# Patient Record
Sex: Male | Born: 1945 | Race: Black or African American | Hispanic: No | Marital: Single | Smoking: Never smoker
Health system: Southern US, Community
[De-identification: ages and names within clinical notes are randomized; demographics above are authoritative.]

## PROBLEM LIST (undated history)

## (undated) DIAGNOSIS — M1990 Unspecified osteoarthritis, unspecified site: Secondary | ICD-10-CM

## (undated) DIAGNOSIS — I639 Cerebral infarction, unspecified: Secondary | ICD-10-CM

## (undated) DIAGNOSIS — I10 Essential (primary) hypertension: Secondary | ICD-10-CM

---

## 2006-02-11 DIAGNOSIS — I639 Cerebral infarction, unspecified: Secondary | ICD-10-CM

## 2006-02-11 HISTORY — DX: Cerebral infarction, unspecified: I63.9

## 2016-09-28 ENCOUNTER — Encounter (HOSPITAL_COMMUNITY): Payer: Self-pay

## 2016-09-28 ENCOUNTER — Inpatient Hospital Stay (HOSPITAL_COMMUNITY)
Admission: EM | Admit: 2016-09-28 | Discharge: 2016-10-01 | DRG: 065 | Disposition: A | Discharge status: Rehabilitation Facility | Payer: Medicare Other | Attending: Internal Medicine | Admitting: Internal Medicine

## 2016-09-28 ENCOUNTER — Emergency Department (HOSPITAL_COMMUNITY): Payer: Medicare Other

## 2016-09-28 DIAGNOSIS — R531 Weakness: Secondary | ICD-10-CM | POA: Diagnosis present

## 2016-09-28 DIAGNOSIS — Z7902 Long term (current) use of antithrombotics/antiplatelets: Secondary | ICD-10-CM | POA: Diagnosis not present

## 2016-09-28 DIAGNOSIS — R29706 NIHSS score 6: Secondary | ICD-10-CM | POA: Diagnosis present

## 2016-09-28 DIAGNOSIS — N3000 Acute cystitis without hematuria: Secondary | ICD-10-CM | POA: Diagnosis not present

## 2016-09-28 DIAGNOSIS — E876 Hypokalemia: Secondary | ICD-10-CM | POA: Diagnosis present

## 2016-09-28 DIAGNOSIS — M1990 Unspecified osteoarthritis, unspecified site: Secondary | ICD-10-CM | POA: Diagnosis present

## 2016-09-28 DIAGNOSIS — I10 Essential (primary) hypertension: Secondary | ICD-10-CM | POA: Diagnosis present

## 2016-09-28 DIAGNOSIS — R270 Ataxia, unspecified: Secondary | ICD-10-CM

## 2016-09-28 DIAGNOSIS — I69351 Hemiplegia and hemiparesis following cerebral infarction affecting right dominant side: Secondary | ICD-10-CM

## 2016-09-28 DIAGNOSIS — N189 Chronic kidney disease, unspecified: Secondary | ICD-10-CM | POA: Diagnosis present

## 2016-09-28 DIAGNOSIS — N400 Enlarged prostate without lower urinary tract symptoms: Secondary | ICD-10-CM | POA: Diagnosis present

## 2016-09-28 DIAGNOSIS — I639 Cerebral infarction, unspecified: Principal | ICD-10-CM | POA: Diagnosis present

## 2016-09-28 DIAGNOSIS — E785 Hyperlipidemia, unspecified: Secondary | ICD-10-CM | POA: Diagnosis present

## 2016-09-28 DIAGNOSIS — Y92009 Unspecified place in unspecified non-institutional (private) residence as the place of occurrence of the external cause: Secondary | ICD-10-CM

## 2016-09-28 DIAGNOSIS — N390 Urinary tract infection, site not specified: Secondary | ICD-10-CM | POA: Diagnosis present

## 2016-09-28 DIAGNOSIS — R350 Frequency of micturition: Secondary | ICD-10-CM | POA: Diagnosis not present

## 2016-09-28 DIAGNOSIS — R278 Other lack of coordination: Secondary | ICD-10-CM | POA: Diagnosis present

## 2016-09-28 DIAGNOSIS — W19XXXA Unspecified fall, initial encounter: Secondary | ICD-10-CM | POA: Diagnosis present

## 2016-09-28 DIAGNOSIS — Z79899 Other long term (current) drug therapy: Secondary | ICD-10-CM | POA: Diagnosis not present

## 2016-09-28 DIAGNOSIS — N179 Acute kidney failure, unspecified: Secondary | ICD-10-CM | POA: Diagnosis present

## 2016-09-28 DIAGNOSIS — G8191 Hemiplegia, unspecified affecting right dominant side: Secondary | ICD-10-CM | POA: Diagnosis not present

## 2016-09-28 DIAGNOSIS — B9620 Unspecified Escherichia coli [E. coli] as the cause of diseases classified elsewhere: Secondary | ICD-10-CM | POA: Diagnosis present

## 2016-09-28 DIAGNOSIS — Z882 Allergy status to sulfonamides status: Secondary | ICD-10-CM | POA: Diagnosis not present

## 2016-09-28 DIAGNOSIS — I739 Peripheral vascular disease, unspecified: Secondary | ICD-10-CM | POA: Diagnosis present

## 2016-09-28 DIAGNOSIS — G9389 Other specified disorders of brain: Secondary | ICD-10-CM | POA: Diagnosis present

## 2016-09-28 DIAGNOSIS — N183 Chronic kidney disease, stage 3 (moderate): Secondary | ICD-10-CM | POA: Diagnosis not present

## 2016-09-28 DIAGNOSIS — R32 Unspecified urinary incontinence: Secondary | ICD-10-CM | POA: Diagnosis not present

## 2016-09-28 DIAGNOSIS — G8111 Spastic hemiplegia affecting right dominant side: Secondary | ICD-10-CM | POA: Diagnosis not present

## 2016-09-28 DIAGNOSIS — I5030 Unspecified diastolic (congestive) heart failure: Secondary | ICD-10-CM | POA: Diagnosis not present

## 2016-09-28 HISTORY — DX: Unspecified osteoarthritis, unspecified site: M19.90

## 2016-09-28 HISTORY — DX: Essential (primary) hypertension: I10

## 2016-09-28 HISTORY — DX: Cerebral infarction, unspecified: I63.9

## 2016-09-28 LAB — COMPREHENSIVE METABOLIC PANEL
ALBUMIN: 3.6 g/dL (ref 3.5–5.0)
ALK PHOS: 65 U/L (ref 38–126)
ALT: 16 U/L — AB (ref 17–63)
AST: 21 U/L (ref 15–41)
Anion gap: 8 (ref 5–15)
BUN: 13 mg/dL (ref 6–20)
CHLORIDE: 113 mmol/L — AB (ref 101–111)
CO2: 24 mmol/L (ref 22–32)
CREATININE: 1.66 mg/dL — AB (ref 0.61–1.24)
Calcium: 8.6 mg/dL — ABNORMAL LOW (ref 8.9–10.3)
GFR calc Af Amer: 47 mL/min — ABNORMAL LOW (ref >60)
GFR calc non Af Amer: 40 mL/min — ABNORMAL LOW (ref >60)
GLUCOSE: 109 mg/dL — AB (ref 65–99)
Potassium: 3.4 mmol/L — ABNORMAL LOW (ref 3.5–5.1)
SODIUM: 145 mmol/L (ref 135–145)
Total Bilirubin: 1.3 mg/dL — ABNORMAL HIGH (ref 0.3–1.2)
Total Protein: 6.7 g/dL (ref 6.5–8.1)

## 2016-09-28 LAB — URINALYSIS, ROUTINE W REFLEX MICROSCOPIC
Bilirubin Urine: NEGATIVE
GLUCOSE, UA: NEGATIVE mg/dL
KETONES UR: NEGATIVE mg/dL
NITRITE: POSITIVE — AB
PROTEIN: 100 mg/dL — AB
Specific Gravity, Urine: 1.014 (ref 1.005–1.030)
pH: 5 (ref 5.0–8.0)

## 2016-09-28 LAB — CBC WITH DIFFERENTIAL/PLATELET
BASOS ABS: 0 10*3/uL (ref 0.0–0.1)
Basophils Relative: 0 %
EOS ABS: 0.1 10*3/uL (ref 0.0–0.7)
EOS PCT: 2 %
HCT: 41.7 % (ref 39.0–52.0)
HEMOGLOBIN: 15.1 g/dL (ref 13.0–17.0)
Lymphocytes Relative: 25 %
Lymphs Abs: 1.5 10*3/uL (ref 0.7–4.0)
MCH: 32.1 pg (ref 26.0–34.0)
MCHC: 36.2 g/dL — ABNORMAL HIGH (ref 30.0–36.0)
MCV: 88.5 fL (ref 78.0–100.0)
Monocytes Absolute: 0.6 10*3/uL (ref 0.1–1.0)
Monocytes Relative: 9 %
NEUTROS PCT: 64 %
Neutro Abs: 3.9 10*3/uL (ref 1.7–7.7)
PLATELETS: 161 10*3/uL (ref 150–400)
RBC: 4.71 MIL/uL (ref 4.22–5.81)
RDW: 13.4 % (ref 11.5–15.5)
WBC: 6.1 10*3/uL (ref 4.0–10.5)

## 2016-09-28 LAB — I-STAT TROPONIN, ED: TROPONIN I, POC: 0.01 ng/mL (ref 0.00–0.08)

## 2016-09-28 MED ORDER — AMLODIPINE BESYLATE 5 MG PO TABS
5.0000 mg | ORAL_TABLET | Freq: Every day | ORAL | Status: DC
  Administered 2016-09-28 – 2016-09-29 (×2): 5 mg via ORAL
  Filled 2016-09-28 (×2): qty 1

## 2016-09-28 MED ORDER — ENOXAPARIN SODIUM 40 MG/0.4ML ~~LOC~~ SOLN
40.0000 mg | SUBCUTANEOUS | Status: DC
  Administered 2016-09-28 – 2016-09-30 (×3): 40 mg via SUBCUTANEOUS
  Filled 2016-09-28 (×3): qty 0.4

## 2016-09-28 MED ORDER — DEXTROSE 5 % IV SOLN
1.0000 g | INTRAVENOUS | Status: DC
  Administered 2016-09-29 – 2016-09-30 (×2): 1 g via INTRAVENOUS
  Filled 2016-09-28 (×3): qty 10

## 2016-09-28 MED ORDER — CLOPIDOGREL BISULFATE 75 MG PO TABS
75.0000 mg | ORAL_TABLET | Freq: Every day | ORAL | Status: DC
  Administered 2016-09-28 – 2016-10-01 (×4): 75 mg via ORAL
  Filled 2016-09-28 (×4): qty 1

## 2016-09-28 MED ORDER — SODIUM CHLORIDE 0.45 % IV SOLN
INTRAVENOUS | Status: DC
  Administered 2016-09-28 – 2016-09-29 (×3): via INTRAVENOUS

## 2016-09-28 MED ORDER — METOPROLOL SUCCINATE ER 50 MG PO TB24
50.0000 mg | ORAL_TABLET | Freq: Every day | ORAL | Status: DC
  Administered 2016-09-28 – 2016-09-29 (×2): 50 mg via ORAL
  Filled 2016-09-28 (×2): qty 1

## 2016-09-28 MED ORDER — FINASTERIDE 5 MG PO TABS
5.0000 mg | ORAL_TABLET | Freq: Every day | ORAL | Status: DC
  Administered 2016-09-28 – 2016-10-01 (×4): 5 mg via ORAL
  Filled 2016-09-28 (×4): qty 1

## 2016-09-28 MED ORDER — SIMVASTATIN 20 MG PO TABS
20.0000 mg | ORAL_TABLET | Freq: Every day | ORAL | Status: DC
  Administered 2016-09-28 – 2016-09-29 (×2): 20 mg via ORAL
  Filled 2016-09-28 (×2): qty 1

## 2016-09-28 MED ORDER — CEFTRIAXONE SODIUM 2 G IJ SOLR
2.0000 g | Freq: Once | INTRAMUSCULAR | Status: AC
  Administered 2016-09-28: 2 g via INTRAVENOUS
  Filled 2016-09-28: qty 2

## 2016-09-28 NOTE — ED Notes (Signed)
ED TO INPATIENT HANDOFF REPORT  Name/Age/Gender Robert Buckley 71 y.o. male  Code Status    Code Status Orders        Start     Ordered   09/28/16 1458  Full code  Continuous     09/28/16 1459    Code Status History    Date Active Date Inactive Code Status Order ID Comments User Context   This patient has a current code status but no historical code status.      Home/SNF/Other Home  Chief Complaint Fall  Level of Care/Admitting Diagnosis ED Disposition    ED Disposition Condition Comment   Admit  Hospital Area: [facility] [[id number]]  Level of Care: Med-Surg [16]  Diagnosis: UTI (urinary tract infection) [240973]  Admitting Physician: Georgette Shell [[id number]]  Attending Physician: Georgette Shell [[id number]]  Estimated length of stay: 3 - 4 days  Certification:: I certify this patient will need inpatient services for at least 2 midnights  PT Class (Do Not Modify): Inpatient [101]  PT Acc Code (Do Not Modify): Private [1]       Medical History Past Medical History:  Diagnosis Date  . Arthritis   . Hypertension   . Stroke [facility]) 2008   residual R sided weakness    Allergies Allergies  Allergen Reactions  . Sulfa Antibiotics Rash    IV Location/Drains/Wounds Patient Lines/Drains/Airways Status   Active Line/Drains/Airways    None          Labs/Imaging Results for orders placed or performed during the hospital encounter of 09/28/16 (from the past 48 hour(s))  Urinalysis, Routine w reflex microscopic     Status: Abnormal   Collection Time: 09/28/16 11:50 AM  Result Value Ref Range   Color, Urine YELLOW YELLOW   APPearance HAZY (A) CLEAR   Specific Gravity, Urine 1.014 1.005 - 1.030   pH 5.0 5.0 - 8.0   Glucose, UA NEGATIVE NEGATIVE mg/dL   Hgb urine dipstick SMALL (A) NEGATIVE   Bilirubin Urine NEGATIVE NEGATIVE   Ketones, ur NEGATIVE NEGATIVE mg/dL   Protein, ur 100 (A) NEGATIVE mg/dL   Nitrite POSITIVE (A)  NEGATIVE   Leukocytes, UA LARGE (A) NEGATIVE   RBC / HPF 0-5 0 - 5 RBC/hpf   WBC, UA TOO NUMEROUS TO COUNT 0 - 5 WBC/hpf   Bacteria, UA MANY (A) NONE SEEN   Squamous Epithelial / LPF 0-5 (A) NONE SEEN   WBC Clumps PRESENT    Mucous PRESENT   CBC with Differential     Status: Abnormal   Collection Time: 09/28/16 12:30 PM  Result Value Ref Range   WBC 6.1 4.0 - 10.5 K/uL   RBC 4.71 4.22 - 5.81 MIL/uL   Hemoglobin 15.1 13.0 - 17.0 g/dL   HCT 41.7 39.0 - 52.0 %   MCV 88.5 78.0 - 100.0 fL   MCH 32.1 26.0 - 34.0 pg   MCHC 36.2 (H) 30.0 - 36.0 g/dL   RDW 13.4 11.5 - 15.5 %   Platelets 161 150 - 400 K/uL   Neutrophils Relative % 64 %   Neutro Abs 3.9 1.7 - 7.7 K/uL   Lymphocytes Relative 25 %   Lymphs Abs 1.5 0.7 - 4.0 K/uL   Monocytes Relative 9 %   Monocytes Absolute 0.6 0.1 - 1.0 K/uL   Eosinophils Relative 2 %   Eosinophils Absolute 0.1 0.0 - 0.7 K/uL   Basophils Relative 0 %   Basophils Absolute 0.0 0.0 - 0.1 K/uL  Comprehensive metabolic panel     Status: Abnormal   Collection Time: 09/28/16 12:30 PM  Result Value Ref Range   Sodium 145 135 - 145 mmol/L   Potassium 3.4 (L) 3.5 - 5.1 mmol/L   Chloride 113 (H) 101 - 111 mmol/L   CO2 24 22 - 32 mmol/L   Glucose, Bld 109 (H) 65 - 99 mg/dL   BUN 13 6 - 20 mg/dL   Creatinine, Ser 1.66 (H) 0.61 - 1.24 mg/dL   Calcium 8.6 (L) 8.9 - 10.3 mg/dL   Total Protein 6.7 6.5 - 8.1 g/dL   Albumin 3.6 3.5 - 5.0 g/dL   AST 21 15 - 41 U/L   ALT 16 (L) 17 - 63 U/L   Alkaline Phosphatase 65 38 - 126 U/L   Total Bilirubin 1.3 (H) 0.3 - 1.2 mg/dL   GFR calc non Af Amer 40 (L) >60 mL/min   GFR calc Af Amer 47 (L) >60 mL/min    Comment: (NOTE) The eGFR has been calculated using the CKD EPI equation. This calculation has not been validated in all clinical situations. eGFR's persistently <60 mL/min signify possible Chronic Kidney Disease.    Anion gap 8 5 - 15  I-Stat Troponin, ED (not at [facility])     Status: None   Collection Time: 09/28/16  12:41 PM  Result Value Ref Range   Troponin i, poc 0.01 0.00 - 0.08 ng/mL   Comment 3            Comment: Due to the release kinetics of cTnI, a negative result within the first hours of the onset of symptoms does not rule out myocardial infarction with certainty. If myocardial infarction is still suspected, repeat the test at appropriate intervals.    Dg Lumbar Spine Complete  Result Date: 09/28/2016 CLINICAL DATA:  Pt had a fall 2 days ago at home after trying to get up from couch to go to the bathroom. Hx of stroke in 2008, with residual right sided weakness. States since his fall, he has not been able to feel his right leg and has no muscle control of it. EXAM: LUMBAR SPINE - COMPLETE 4+ VIEW COMPARISON:  None. FINDINGS: No fracture. Slight anterolisthesis of L4 on L5, degenerative in origin. No other spondylolisthesis. Moderate loss of disc height at L4-L5 and L5-S1. Endplate osteophytes noted most prominently at these levels. There is facet degenerative change bilaterally at L4-L5-L5-S1. Bones are demineralized. Soft tissues are unremarkable. IMPRESSION: 1. No fracture or acute finding. 2. Degenerative changes as detailed. Electronically Signed   By: Lajean Manes M.D.   On: 09/28/2016 12:27   Ct Head Wo Contrast  Result Date: 09/28/2016 CLINICAL DATA:  Fall 2 days ago. Fall yesterday as well. Dizziness. Stroke in 2008 right-sided weakness. EXAM: CT HEAD WITHOUT CONTRAST TECHNIQUE: Contiguous axial images were obtained from the base of the skull through the vertex without intravenous contrast. COMPARISON:  None. FINDINGS: Brain: No subdural, epidural, or subarachnoid hemorrhage. Cerebellum, brainstem, and basal cisterns are normal. Ventricles and sulci are prominent. White matter changes are identified. No acute cortical ischemia or infarct. No mass, mass effect, or midline shift. Vascular: Calcified atherosclerosis is seen in the intracranial carotid arteries. Skull: Normal. Negative for  fracture or focal lesion. Sinuses/Orbits: No acute finding. Other: None. IMPRESSION: 1. No acute abnormalities identified. Chronic white matter changes. Prominence of ventricles and sulci may represent volume loss. Given the history of falls, recommend correlation for symptoms of normal pressure hydrocephalus as well. Electronically  Signed   By: Dorise Bullion III M.D   On: 09/28/2016 12:15    Pending Labs Unresulted Labs    Start     Ordered   09/29/16 7564  Basic metabolic panel  Tomorrow morning,   R     09/28/16 1459   09/28/16 1402  Urine culture  STAT,   STAT     09/28/16 1401   09/28/16 1402  Blood culture (routine x 2)  BLOOD CULTURE X 2,   STAT     09/28/16 1401      Vitals/Pain Today's Vitals   09/28/16 1118 09/28/16 1119 09/28/16 1352  BP: (!) 161/93  (!) 146/118  Pulse: 72  67  Resp: 16  14  Temp: 98 F (36.7 C)    TempSrc: Oral    SpO2: 93%  93%  Weight:  203 lb (92.1 kg)   Height:  '5\' 7"'  (1.702 m)     Isolation Precautions No active isolations  Medications Medications  enoxaparin (LOVENOX) injection 30 mg (not administered)  amLODipine (NORVASC) tablet 5 mg (not administered)  clopidogrel (PLAVIX) tablet 75 mg (not administered)  finasteride (PROSCAR) tablet 5 mg (not administered)  metoprolol succinate (TOPROL-XL) 24 hr tablet 50 mg (not administered)  simvastatin (ZOCOR) tablet 20 mg (not administered)  cefTRIAXone (ROCEPHIN) 2 g in dextrose 5 % 50 mL IVPB (0 g Intravenous Stopped 09/28/16 1513)    Mobility walks with device

## 2016-09-28 NOTE — ED Notes (Signed)
x2 unsuccessful IV

## 2016-09-28 NOTE — H&P (Signed)
History and Physical    Robert Buckley WGN:[id number] DOB: 16-Mar-1945 DOA: 09/28/2016  PCP: Patient, No Pcp Per Patient coming from: home  I have personally briefly reviewed patient's old medical records in [facility]  Chief Complaint: cantwalk  HPI: Robert Buckley is a 71 y.o. male with medical history significant for stroke with residual right-sided weakness admitted with worsening of the right-sided weakness. Patient had a fall 2 days ago at home while he was trying to get up from chair. Ever since he has not been able to walk. He denies any pain. He denies hitting his head. He denies loss of consciousness. He denies hitting his head. At baseline he was able to walk with a walker inside the house. He reports that he cannot do that anymore. He denies any fever chills nausea vomiting or diarrhea. He denies any headache or changes with his vision. He denied any dysuria.  ED Course: He had a CT scan of the head which she did not reveal anything acute. UA showed positive nitrites and leukocytes. His BUN is 13 creatinine is 1.66 I do not have a baseline creatinine. The ER doctor spoke with the neurologist on call to do MRI on Monday if his symptoms continuous or his symptoms gets worse and to consult them officialy. 000  Review of Systems: As per HPI otherwise 10 point review of systems negative.    Past Medical History:  Diagnosis Date  . Arthritis   . Hypertension     History reviewed. No pertinent surgical history.   has no tobacco, alcohol, and drug history on file.  Allergies  Allergen Reactions  . Sulfa Antibiotics Rash    No family history on file.   Prior to Admission medications   Medication Sig Start Date End Date Taking? Authorizing Provider  amLODipine (NORVASC) 5 MG tablet Take 5 mg by mouth daily.   Yes [provider]  clopidogrel (PLAVIX) 75 MG tablet Take 75 mg by mouth daily.   Yes [provider]  finasteride (PROSCAR) 5 MG tablet Take  5 mg by mouth daily.   Yes [provider]  metoprolol succinate (TOPROL-XL) 50 MG 24 hr tablet Take 50 mg by mouth daily. Take with or immediately following a meal.   Yes [provider]  ramipril (ALTACE) 10 MG capsule Take 10 mg by mouth daily.   Yes [provider]  simvastatin (ZOCOR) 20 MG tablet Take 20 mg by mouth daily.   Yes [provider]    Physical Exam: Vitals:   09/28/16 1118 09/28/16 1119 09/28/16 1352  BP: (!) 161/93  (!) 146/118  Pulse: 72  67  Resp: 16  14  Temp: 98 F (36.7 C)    TempSrc: Oral    SpO2: 93%  93%  Weight:  92.1 kg (203 lb)   Height:  5\' 7"  (1.702 m)     Constitutional: NAD, calm, comfortable Vitals:   09/28/16 1118 09/28/16 1119 09/28/16 1352  BP: (!) 161/93  (!) 146/118  Pulse: 72  67  Resp: 16  14  Temp: 98 F (36.7 C)    TempSrc: Oral    SpO2: 93%  93%  Weight:  92.1 kg (203 lb)   Height:  5\' 7"  (1.702 m)    Eyes: PERRL, lids and conjunctivae normal ENMT: Mucous membranes are moist. Posterior pharynx clear of any exudate or lesions.Normal dentition.  Neck: normal, supple, no masses, no thyromegaly Respiratory: clear to auscultation bilaterally, no wheezing, no crackles. Normal  respiratory effort. No accessory muscle use.  Cardiovascular: Regular rate and rhythm, no murmurs / rubs / gallops. No extremity edema. 2+ pedal pulses. No carotid bruits.  Abdomen: no tenderness, no masses palpated. No hepatosplenomegaly. Bowel sounds positive.  Musculoskeletal: no clubbing / cyanosis. No joint deformity upper and lower extremities. Good ROM, no contractures. Normal muscle tone.  Skin: no rashes, lesions, ulcers. No induration Neurologic: CN 2-12 grossly intact. Sensation intact, DTR normal. Strength 5/5 in all 4.  Psychiatric: Normal judgment and insight. Alert and oriented x 3. Normal mood.   (Anything < 9 systems with 2 bullets each down codes to level 1) (If patient refuses exam can't bill higher  level) (Make sure to document decubitus ulcers present on admission -- if possible -- and whether patient has chronic indwelling catheter at time of admission)  Labs on Admission: I have personally reviewed following labs and imaging studies  CBC:  Recent Labs Lab 09/28/16 1230  WBC 6.1  NEUTROABS 3.9  HGB 15.1  HCT 41.7  MCV 88.5  PLT 161   Basic Metabolic Panel:  Recent Labs Lab 09/28/16 1230  NA 145  K 3.4*  CL 113*  CO2 24  GLUCOSE 109*  BUN 13  CREATININE 1.66*  CALCIUM 8.6*   GFR: Estimated Creatinine Clearance: 44.8 mL/min (A) (by C-G formula based on SCr of 1.66 mg/dL (H)). Liver Function Tests:  Recent Labs Lab 09/28/16 1230  AST 21  ALT 16*  ALKPHOS 65  BILITOT 1.3*  PROT 6.7  ALBUMIN 3.6   No results for input(s): LIPASE, AMYLASE in the last 168 hours. No results for input(s): AMMONIA in the last 168 hours. Coagulation Profile: No results for input(s): INR, PROTIME in the last 168 hours. Cardiac Enzymes: No results for input(s): CKTOTAL, CKMB, CKMBINDEX, TROPONINI in the last 168 hours. BNP (last 3 results) No results for input(s): PROBNP in the last 8760 hours. HbA1C: No results for input(s): HGBA1C in the last 72 hours. CBG: No results for input(s): GLUCAP in the last 168 hours. Lipid Profile: No results for input(s): CHOL, HDL, LDLCALC, TRIG, CHOLHDL, LDLDIRECT in the last 72 hours. Thyroid Function Tests: No results for input(s): TSH, T4TOTAL, FREET4, T3FREE, THYROIDAB in the last 72 hours. Anemia Panel: No results for input(s): VITAMINB12, FOLATE, FERRITIN, TIBC, IRON, RETICCTPCT in the last 72 hours. Urine analysis:    Component Value Date/Time   COLORURINE YELLOW 09/28/2016 1150   APPEARANCEUR HAZY (A) 09/28/2016 1150   LABSPEC 1.014 09/28/2016 1150   PHURINE 5.0 09/28/2016 1150   GLUCOSEU NEGATIVE 09/28/2016 1150   HGBUR SMALL (A) 09/28/2016 1150   BILIRUBINUR NEGATIVE 09/28/2016 1150   KETONESUR NEGATIVE 09/28/2016 1150    PROTEINUR 100 (A) 09/28/2016 1150   NITRITE POSITIVE (A) 09/28/2016 1150   LEUKOCYTESUR LARGE (A) 09/28/2016 1150    Radiological Exams on Admission: Dg Lumbar Spine Complete  Result Date: 09/28/2016 CLINICAL DATA:  Pt had a fall 2 days ago at home after trying to get up from couch to go to the bathroom. Hx of stroke in 2008, with residual right sided weakness. States since his fall, he has not been able to feel his right leg and has no muscle control of it. EXAM: LUMBAR SPINE - COMPLETE 4+ VIEW COMPARISON:  None. FINDINGS: No fracture. Slight anterolisthesis of L4 on L5, degenerative in origin. No other spondylolisthesis. Moderate loss of disc height at L4-L5 and L5-S1. Endplate osteophytes noted most prominently at these levels. There is facet degenerative change bilaterally at L4-L5-L5-S1.  Bones are demineralized. Soft tissues are unremarkable. IMPRESSION: 1. No fracture or acute finding. 2. Degenerative changes as detailed. Electronically Signed   By: Amie Portland M.D.   On: 09/28/2016 12:27   Ct Head Wo Contrast  Result Date: 09/28/2016 CLINICAL DATA:  Fall 2 days ago. Fall yesterday as well. Dizziness. Stroke in 2008 right-sided weakness. EXAM: CT HEAD WITHOUT CONTRAST TECHNIQUE: Contiguous axial images were obtained from the base of the skull through the vertex without intravenous contrast. COMPARISON:  None. FINDINGS: Brain: No subdural, epidural, or subarachnoid hemorrhage. Cerebellum, brainstem, and basal cisterns are normal. Ventricles and sulci are prominent. White matter changes are identified. No acute cortical ischemia or infarct. No mass, mass effect, or midline shift. Vascular: Calcified atherosclerosis is seen in the intracranial carotid arteries. Skull: Normal. Negative for fracture or focal lesion. Sinuses/Orbits: No acute finding. Other: None. IMPRESSION: 1. No acute abnormalities identified. Chronic white matter changes. Prominence of ventricles and sulci may represent volume loss.  Given the history of falls, recommend correlation for symptoms of normal pressure hydrocephalus as well. Electronically Signed   By: Gerome Sam III M.D   On: 09/28/2016 12:15    EKG: Independently reviewed.   Assessment/Plan Active Problems:   UTI (urinary tract infection)   Urinary tract infection patient received a dose of Rocephin in the ER.UA consistent with urinary tract infection. Follow-up urine culture. Continue Proscar for prostate enlargement.  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  Renal insufficiency unknown baseline.ivf and follow up.  HTN continue norvasc andmetoprolol.hold altace for now dueto renal insufficiancy.restart later.        DVT prophylaxis lovenox Code Status: full Family Communication:  Disposition Plan: snf Consults called: none Admission status: inpatient   Alwyn Ren MD [facility]   If 7PM-7AM, please contact night-coverage [email or web address] Password [email or web address]  09/28/2016, 3:06 PM

## 2016-09-28 NOTE — ED Triage Notes (Signed)
Per EMS: Pt had a fall 2 days ago at home after trying to get up from a chair. Hx of stroke in 2008, with residual right sided weakness. Pt reports having another fall yesterday in the bathroom. Pt denies getting dizzy prior to fall, denies LOC or hitting his head. EMS VS: 142/80, HR 78, RR 16, 02 95% room air.

## 2016-09-28 NOTE — Progress Notes (Signed)
Bed request approved for [id number], ED RN can call report to Morrie Sheldon, RN at [phone] at 3:30.  Earnest Conroy. Clelia Croft, RN

## 2016-09-28 NOTE — ED Notes (Signed)
Two sets of blood cultures sent to the lab

## 2016-09-28 NOTE — ED Notes (Signed)
Patient transported to CT 

## 2016-09-28 NOTE — ED Provider Notes (Signed)
[facility]-EMERGENCY DEPT Provider Note   CSN: [id number] Arrival date & time: 09/28/16  1105     History   Chief Complaint Chief Complaint  Patient presents with  . Fall    HPI Robert Buckley is a 71 y.o. male.  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 Patient's son initially described generalized weakness, patient unable to get up and move around after this fall. Reports that he is in normal state of health prior to the fall. Reports that he was trying to get out of his chair to his walker when he slid to the ground. Reports he did not lose consciousness, fell onto his bottom, and then laid back. Denies traumatic injuries or pain, including no headache, neck pain, back pain, leg pain. Reports he's been in her normal state of health without fevers, cough, shortness of breath, chest pain, abdominal pain, diarrhea, vomiting, new numbness, change in speech or change in vision. Reports he's had right-sided weakness since his CVA, but his right lower extremity weakness appears worse. Reports typically he is able to ambulate with his walker, however since this fall, he just been unable to stand up or ambulate. Reports he cannot bend his leg. Reports the flexion of the hip is at baseline however.   Past Medical History:  Diagnosis Date  . Arthritis   . Hypertension     There are no active problems to display for this patient.   History reviewed. No pertinent surgical history.     Home Medications    Prior to Admission medications   Medication Sig Start Date End Date Taking? Authorizing Provider  amLODipine (NORVASC) 5 MG tablet Take 5 mg by mouth daily.   Yes [provider]  clopidogrel (PLAVIX) 75 MG tablet Take 75 mg by mouth daily.   Yes [provider]  finasteride (PROSCAR) 5 MG tablet Take 5 mg by mouth daily.   Yes  [provider]  metoprolol succinate (TOPROL-XL) 50 MG 24 hr tablet Take 50 mg by mouth daily. Take with or immediately following a meal.   Yes [provider]  ramipril (ALTACE) 10 MG capsule Take 10 mg by mouth daily.   Yes [provider]  simvastatin (ZOCOR) 20 MG tablet Take 20 mg by mouth daily.   Yes [provider]    Family History No family history on file.  Social History Social History  Substance Use Topics  . Smoking status: Not on file  . Smokeless tobacco: Not on file  . Alcohol use Not on file     Allergies   Sulfa antibiotics   Review of Systems Review of Systems  Constitutional: Negative for fever.  HENT: Negative for sore throat.   Eyes: Negative for visual disturbance.  Respiratory: Negative for shortness of breath.   Cardiovascular: Negative for chest pain.  Gastrointestinal: Negative for abdominal pain, diarrhea, nausea and vomiting.  Genitourinary: Negative for difficulty urinating and dysuria.  Musculoskeletal: Negative for back pain and neck stiffness.  Skin: Negative for rash.  Neurological: Positive for weakness (right lower extermity worse than baselien). Negative for syncope, facial asymmetry, speech difficulty, numbness and headaches.     Physical Exam Updated Vital Signs BP (!) 146/118   Pulse 67   Temp 98 F (36.7 C) (Oral)   Resp 14   Ht 5\' 7"  (1.702 m)   Wt  92.1 kg (203 lb)   SpO2 93%   BMI 31.79 kg/m   Physical Exam  Constitutional: He is oriented to person, place, and time. He appears well-developed and well-nourished. No distress.  HENT:  Head: Normocephalic and atraumatic.  Eyes: Pupils are equal, round, and reactive to light. Conjunctivae and EOM are normal.  Neck: Normal range of motion.  Cardiovascular: Normal rate, regular rhythm, normal heart sounds and intact distal pulses.  Exam reveals no gallop and no friction rub.   No murmur heard. Pulmonary/Chest: Effort normal and breath  sounds normal. No respiratory distress. He has no wheezes. He has no rales.  Abdominal: Soft. He exhibits no distension. There is no tenderness. There is no guarding.  Musculoskeletal: He exhibits no edema.  Neurological: He is alert and oriented to person, place, and time.  Right arm weakness at baseline per pt and son, weak grip, weak flexion, extension with more strength but weak in comparison to left, right pronator drift  Right LE hip flexion with slight drift (baseline per pt)  Unable to flex right knee at all which is new, able to extend Weakness with right plantar/dori flexion which pt and son state is increased from baseline  Skin: Skin is warm and dry. He is not diaphoretic.  Nursing note and vitals reviewed.    ED Treatments / Results  Labs (all labs ordered are listed, but only abnormal results are displayed) Labs Reviewed  CBC WITH DIFFERENTIAL/PLATELET - Abnormal; Notable for the following:       Result Value   MCHC 36.2 (*)    All other components within normal limits  COMPREHENSIVE METABOLIC PANEL - Abnormal; Notable for the following:    Potassium 3.4 (*)    Chloride 113 (*)    Glucose, Bld 109 (*)    Creatinine, Ser 1.66 (*)    Calcium 8.6 (*)    ALT 16 (*)    Total Bilirubin 1.3 (*)    GFR calc non Af Amer 40 (*)    GFR calc Af Amer 47 (*)    All other components within normal limits  URINALYSIS, ROUTINE W REFLEX MICROSCOPIC - Abnormal; Notable for the following:    APPearance HAZY (*)    Hgb urine dipstick SMALL (*)    Protein, ur 100 (*)    Nitrite POSITIVE (*)    Leukocytes, UA LARGE (*)    Bacteria, UA MANY (*)    Squamous Epithelial / LPF 0-5 (*)    All other components within normal limits  URINE CULTURE  CULTURE, BLOOD (ROUTINE X 2)  CULTURE, BLOOD (ROUTINE X 2)  I-STAT TROPONIN, ED    EKG  EKG Interpretation  Date/Time:  Saturday September 28 2016 11:42:34 EDT Ventricular Rate:  57 PR Interval:    QRS Duration: 104 QT Interval:  423 QTC  Calculation: 412 R Axis:   -33 Text Interpretation:  Sinus rhythm Short PR interval Abnormal R-wave progression, early transition Left ventricular hypertrophy Abnormal T, consider ischemia, diffuse leads Baseline wander in lead(s) V3 No previous ECGs available Confirmed by Alvira Monday ([id number]) on 09/28/2016 11:48:10 AM       Radiology Dg Lumbar Spine Complete  Result Date: 09/28/2016 CLINICAL DATA:  Pt had a fall 2 days ago at home after trying to get up from couch to go to the bathroom. Hx of stroke in 2008, with residual right sided weakness. States since his fall, he has not been able to feel his right leg and has no muscle control  of it. EXAM: LUMBAR SPINE - COMPLETE 4+ VIEW COMPARISON:  None. FINDINGS: No fracture. Slight anterolisthesis of L4 on L5, degenerative in origin. No other spondylolisthesis. Moderate loss of disc height at L4-L5 and L5-S1. Endplate osteophytes noted most prominently at these levels. There is facet degenerative change bilaterally at L4-L5-L5-S1. Bones are demineralized. Soft tissues are unremarkable. IMPRESSION: 1. No fracture or acute finding. 2. Degenerative changes as detailed. Electronically Signed   By: Amie Portland M.D.   On: 09/28/2016 12:27   Ct Head Wo Contrast  Result Date: 09/28/2016 CLINICAL DATA:  Fall 2 days ago. Fall yesterday as well. Dizziness. Stroke in 2008 right-sided weakness. EXAM: CT HEAD WITHOUT CONTRAST TECHNIQUE: Contiguous axial images were obtained from the base of the skull through the vertex without intravenous contrast. COMPARISON:  None. FINDINGS: Brain: No subdural, epidural, or subarachnoid hemorrhage. Cerebellum, brainstem, and basal cisterns are normal. Ventricles and sulci are prominent. White matter changes are identified. No acute cortical ischemia or infarct. No mass, mass effect, or midline shift. Vascular: Calcified atherosclerosis is seen in the intracranial carotid arteries. Skull: Normal. Negative for fracture or focal  lesion. Sinuses/Orbits: No acute finding. Other: None. IMPRESSION: 1. No acute abnormalities identified. Chronic white matter changes. Prominence of ventricles and sulci may represent volume loss. Given the history of falls, recommend correlation for symptoms of normal pressure hydrocephalus as well. Electronically Signed   By: Gerome Sam III M.D   On: 09/28/2016 12:15    Procedures Procedures (including critical care time)  Medications Ordered in ED Medications  cefTRIAXone (ROCEPHIN) 2 g in dextrose 5 % 50 mL IVPB (not administered)     Initial Impression / Assessment and Plan / ED Course  I have reviewed the triage vital signs and the nursing notes.  Pertinent labs & imaging results that were available during my care of the patient were reviewed by me and considered in my medical decision making (see chart for details).     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 Given hx of CVA with fall and new weakness, Head CT done showing no acute abnormalities. Work up for possible contributors to generalized weakness shows no significant anemia or electrolyte abnormalities. Urinalysis shows UTI.  Lumbar XR shows no acute findings.  No significant pain to suggest hamstring muscle tear.   Given increased right sided weakness from baseline, inability to ambulate, and presence of UTI, suspect stroke reactivation as most likely etiology.  Discussed with Neurology. Given duration, no new symptoms from baseline and worsening of chronic deficit, feel admission to [facility], PT/OT, treatment of UTI is appropriate, consider MR brain/lumbar spine. Pt admitted for further care.   Final Clinical Impressions(s) / ED Diagnoses   Final diagnoses:  Right sided weakness  Urinary tract infection without hematuria, site unspecified    New Prescriptions New  Prescriptions   No medications on file     Alvira Monday, MD 09/28/16 1420

## 2016-09-28 NOTE — ED Notes (Addendum)
Called 4W in attempt to inquire about bringing pt up, secretary states she will call the charge nurse to make sure that 1430 is where pt is going to and will call us back

## 2016-09-28 NOTE — ED Notes (Signed)
Bed: QB16 Expected date:  Expected time:  Means of arrival:  Comments: 71 yo fall

## 2016-09-29 ENCOUNTER — Inpatient Hospital Stay (HOSPITAL_COMMUNITY): Payer: Medicare Other

## 2016-09-29 ENCOUNTER — Ambulatory Visit (HOSPITAL_COMMUNITY)
Admit: 2016-09-29 | Discharge: 2016-09-29 | Disposition: A | Discharge status: Home / Self Care | Payer: Medicare Other | Attending: Internal Medicine | Admitting: Internal Medicine

## 2016-09-29 ENCOUNTER — Ambulatory Visit (HOSPITAL_COMMUNITY)
Admission: RE | Admit: 2016-09-29 | Discharge: 2016-09-29 | Disposition: A | Discharge status: Home / Self Care | Payer: Medicare Other | Source: Other Acute Inpatient Hospital | Attending: Internal Medicine | Admitting: Internal Medicine

## 2016-09-29 ENCOUNTER — Encounter (HOSPITAL_COMMUNITY): Payer: Self-pay

## 2016-09-29 DIAGNOSIS — N3000 Acute cystitis without hematuria: Secondary | ICD-10-CM

## 2016-09-29 DIAGNOSIS — R531 Weakness: Secondary | ICD-10-CM

## 2016-09-29 LAB — BLOOD CULTURE ID PANEL (REFLEXED)
ACINETOBACTER BAUMANNII: NOT DETECTED
CANDIDA GLABRATA: NOT DETECTED
CANDIDA KRUSEI: NOT DETECTED
Candida albicans: NOT DETECTED
Candida parapsilosis: NOT DETECTED
Candida tropicalis: NOT DETECTED
Carbapenem resistance: NOT DETECTED
ENTEROBACTER CLOACAE COMPLEX: NOT DETECTED
ENTEROBACTERIACEAE SPECIES: NOT DETECTED
ESCHERICHIA COLI: NOT DETECTED
Enterococcus species: NOT DETECTED
Haemophilus influenzae: NOT DETECTED
KLEBSIELLA OXYTOCA: NOT DETECTED
Klebsiella pneumoniae: NOT DETECTED
Listeria monocytogenes: NOT DETECTED
Methicillin resistance: NOT DETECTED
NEISSERIA MENINGITIDIS: NOT DETECTED
PSEUDOMONAS AERUGINOSA: NOT DETECTED
Proteus species: NOT DETECTED
STAPHYLOCOCCUS SPECIES: DETECTED — AB
STREPTOCOCCUS AGALACTIAE: NOT DETECTED
STREPTOCOCCUS SPECIES: NOT DETECTED
Serratia marcescens: NOT DETECTED
Staphylococcus aureus (BCID): NOT DETECTED
Streptococcus pneumoniae: NOT DETECTED
Streptococcus pyogenes: NOT DETECTED
Vancomycin resistance: NOT DETECTED

## 2016-09-29 LAB — BASIC METABOLIC PANEL
ANION GAP: 8 (ref 5–15)
BUN: 17 mg/dL (ref 6–20)
CO2: 22 mmol/L (ref 22–32)
Calcium: 8.2 mg/dL — ABNORMAL LOW (ref 8.9–10.3)
Chloride: 112 mmol/L — ABNORMAL HIGH (ref 101–111)
Creatinine, Ser: 1.59 mg/dL — ABNORMAL HIGH (ref 0.61–1.24)
GFR calc Af Amer: 49 mL/min — ABNORMAL LOW (ref >60)
GFR calc non Af Amer: 42 mL/min — ABNORMAL LOW (ref >60)
GLUCOSE: 99 mg/dL (ref 65–99)
POTASSIUM: 3.6 mmol/L (ref 3.5–5.1)
Sodium: 142 mmol/L (ref 135–145)

## 2016-09-29 MED ORDER — STROKE: EARLY STAGES OF RECOVERY BOOK
Freq: Once | Status: AC
  Administered 2016-09-29: 17:00:00
  Filled 2016-09-29: qty 1

## 2016-09-29 NOTE — Consult Note (Signed)
Requesting Physician: Dr. Allena Katz    Chief Complaint: Right leg weakness, fall  History obtained from:  Patient    HPI:                                                                                                                                       Robert Buckley is an 71 y.o. male with past medical history of stroke in 2010 with residual right-sided weakness, hypertension who presents after a fall 2 days ago. Afterwards patient noticed that he could no longer walk and his right leg was weak. He was admitted to [facility] and was found to have a left basal ganglia stroke. He also has a urinary tract infection.  Date last known well:09/27/16 Time last known well:  tPA Given: No, outside window  NIHSS: 6        6  Modified Rankin: 3   Past Medical History:  Diagnosis Date  . Arthritis   . Hypertension   . Stroke [facility]) 2008   residual R sided weakness    History reviewed. No pertinent surgical history.  History reviewed. No pertinent family history.   Social History:  reports that he has never smoked. He has never used smokeless tobacco. He reports that he does not drink alcohol or use drugs.  Allergies:  Allergies  Allergen Reactions  . Sulfa Antibiotics Rash    Medications:                                                                                                                           Not on ASA at home   ROS:  14 point ROS negative   Examination:                                                                                                      General: Appears well-developed and well-nourished.  Psych: Affect appropriate to situation Eyes: No scleral injection HENT: No OP obstrucion Head: Normocephalic.  Cardiovascular: Normal rate and regular rhythm.  Respiratory: Effort normal and breath sounds normal to  anterior ascultation GI: Soft.  No distension. There is no tenderness.  Skin: WDI   Neurological Examination Mental Status: Alert, oriented, thought content appropriate.  Mild dysarthria.  Able to follow 3 step commands without difficulty. Cranial Nerves: II: Discs flat bilaterally; Visual fields grossly normal,  III,IV, VI: ptosis not present, extra-ocular motions intact bilaterally, pupils equal, round, reactive to light and accommodation V,VII: mild Right NFF VIII: hearing normal bilaterally IX,X: uvula rises symmetrically XI: bilateral shoulder shrug XII: midline tongue extension Motor: Right : Upper extremity   4/5    Left:     Upper extremity   5/5  Lower extremity   3/5     Lower extremity   5/5 Tone and bulk:normal tone throughout; no atrophy noted Sensory: reduced sensation to light touch in the right  arm and leg and face Deep Tendon Reflexes: 3 + reflexes over right patella and ankle Plantars: Right: downgoing   Left: downgoing Cerebellar: finger-to-nose ataxia on the right Gait: Unable to walk without support, ataxic     Lab Results: Basic Metabolic Panel:  Recent Labs Lab 09/28/16 1230 09/29/16 0432  NA 145 142  K 3.4* 3.6  CL 113* 112*  CO2 24 22  GLUCOSE 109* 99  BUN 13 17  CREATININE 1.66* 1.59*  CALCIUM 8.6* 8.2*    CBC:  Recent Labs Lab 09/28/16 1230  WBC 6.1  NEUTROABS 3.9  HGB 15.1  HCT 41.7  MCV 88.5  PLT 161    Coagulation Studies: No results for input(s): LABPROT, INR in the last 72 hours.  Imaging: Dg Lumbar Spine Complete  Result Date: 09/28/2016 CLINICAL DATA:  Pt had a fall 2 days ago at home after trying to get up from couch to go to the bathroom. Hx of stroke in 2008, with residual right sided weakness. States since his fall, he has not been able to feel his right leg and has no muscle control of it. EXAM: LUMBAR SPINE - COMPLETE 4+ VIEW COMPARISON:  None. FINDINGS: No fracture. Slight anterolisthesis of L4 on L5,  degenerative in origin. No other spondylolisthesis. Moderate loss of disc height at L4-L5 and L5-S1. Endplate osteophytes noted most prominently at these levels. There is facet degenerative change bilaterally at L4-L5-L5-S1. Bones are demineralized. Soft tissues are unremarkable. IMPRESSION: 1. No fracture or acute finding. 2. Degenerative changes as detailed. Electronically Signed   By: Amie Portland M.D.   On: 09/28/2016 12:27   Ct Head Wo Contrast  Result Date: 09/28/2016 CLINICAL DATA:  Fall 2 days ago. Fall yesterday as well. Dizziness. Stroke in 2008 right-sided weakness. EXAM: CT HEAD WITHOUT CONTRAST TECHNIQUE: Contiguous axial images were  obtained from the base of the skull through the vertex without intravenous contrast. COMPARISON:  None. FINDINGS: Brain: No subdural, epidural, or subarachnoid hemorrhage. Cerebellum, brainstem, and basal cisterns are normal. Ventricles and sulci are prominent. White matter changes are identified. No acute cortical ischemia or infarct. No mass, mass effect, or midline shift. Vascular: Calcified atherosclerosis is seen in the intracranial carotid arteries. Skull: Normal. Negative for fracture or focal lesion. Sinuses/Orbits: No acute finding. Other: None. IMPRESSION: 1. No acute abnormalities identified. Chronic white matter changes. Prominence of ventricles and sulci may represent volume loss. Given the history of falls, recommend correlation for symptoms of normal pressure hydrocephalus as well. Electronically Signed   By: Gerome Sam III M.D   On: 09/28/2016 12:15   Mr Brain Wo Contrast  Result Date: 09/29/2016 CLINICAL DATA:  Acute presentation with ataxia.  Assess for stroke. EXAM: MRI HEAD WITHOUT CONTRAST TECHNIQUE: Multiplanar, multiecho pulse sequences of the brain and surrounding structures were obtained without intravenous contrast. COMPARISON:  CT 09/28/2016 FINDINGS: Brain: diffusion imaging shows a 1 cm acute infarction on the left in the radiating  white matter tracts of the posterior corona radiata. No cortical or large vessel territory infarction. Elsewhere, there chronic small-vessel ischemic changes affecting the pons. There are old small vessel cerebellar infarctions. Cerebral hemispheres show old small vessel infarctions affecting the thalami, basal ganglia and hemispheric white matter. No mass lesion, hemorrhage, hydrocephalus or extra-axial collection. Vascular: Major vessels at the base of the brain show flow. Skull and upper cervical spine: Negative Sinuses/Orbits: Clear except for retention cyst in the right maxillary sinus and right division of the sphenoid sinus. Orbits negative. Other: None IMPRESSION: Acute 1 cm infarction in the left hemisphere radiating white matter tracts/posterior corona radiata. No hemorrhage or mass effect. Extensive chronic small vessel ischemic changes elsewhere throughout the brain. Electronically Signed   By: Paulina Fusi M.D.   On: 09/29/2016 13:45     ASSESSMENT AND PLAN   Acute ischemic stroke- left coronary radiata  Risk factors: Hypertension Etiology: Small vessel disease  Recommend #MRA Head  #MRA neck or carotid US #Transthoracic Echo  # Start patient on Plavix 75 mg #Start or continue Atorvastatin 40 mg/other high intensity statin # BP goal: permissive HTN upto 180 systolic, PRNs, however since he is Day 2, slowly bring BP down to 140/90 # HBAIC and Lipid profile # Telemetry monitoring # Frequent neuro checks # NPO until passes stroke swallow screen  Please page stroke NP  Or  PA  Or MD from 8am -4 pm  as this patient from this time will be  followed by the stroke.   You can look them up on [email or web address]  Password [facility]  Georgiana Spinner Shellyann Wandrey MD [facility] [phone]  If 7pm to 7am, please call on call as listed on AMION.

## 2016-09-29 NOTE — Progress Notes (Signed)
PHARMACY - PHYSICIAN COMMUNICATION CRITICAL VALUE ALERT - BLOOD CULTURE IDENTIFICATION (BCID)  Results for orders placed or performed during the hospital encounter of 09/28/16  Blood Culture ID Panel (Reflexed) (Collected: 09/28/2016  2:27 PM)  Result Value Ref Range   Enterococcus species NOT DETECTED NOT DETECTED   Vancomycin resistance NOT DETECTED NOT DETECTED   Listeria monocytogenes NOT DETECTED NOT DETECTED   Staphylococcus species DETECTED (A) NOT DETECTED   Staphylococcus aureus NOT DETECTED NOT DETECTED   Methicillin resistance NOT DETECTED NOT DETECTED   Streptococcus species NOT DETECTED NOT DETECTED   Streptococcus agalactiae NOT DETECTED NOT DETECTED   Streptococcus pneumoniae NOT DETECTED NOT DETECTED   Streptococcus pyogenes NOT DETECTED NOT DETECTED   Acinetobacter baumannii NOT DETECTED NOT DETECTED   Enterobacteriaceae species NOT DETECTED NOT DETECTED   Enterobacter cloacae complex NOT DETECTED NOT DETECTED   Escherichia coli NOT DETECTED NOT DETECTED   Klebsiella oxytoca NOT DETECTED NOT DETECTED   Klebsiella pneumoniae NOT DETECTED NOT DETECTED   Proteus species NOT DETECTED NOT DETECTED   Serratia marcescens NOT DETECTED NOT DETECTED   Carbapenem resistance NOT DETECTED NOT DETECTED   Haemophilus influenzae NOT DETECTED NOT DETECTED   Neisseria meningitidis NOT DETECTED NOT DETECTED   Pseudomonas aeruginosa NOT DETECTED NOT DETECTED   Candida albicans NOT DETECTED NOT DETECTED   Candida glabrata NOT DETECTED NOT DETECTED   Candida krusei NOT DETECTED NOT DETECTED   Candida parapsilosis NOT DETECTED NOT DETECTED   Candida tropicalis NOT DETECTED NOT DETECTED    Name of physician (or Provider) Contacted: Maia Petties  Changes to prescribed antibiotics required: none  Otho Bellows 09/29/2016  2:13 PM

## 2016-09-29 NOTE — Progress Notes (Signed)
[facility] Progress Note  Patient: Robert Buckley RUE:[id number]   PCP: Patient, No Pcp Per DOB: 10-27-1945   DOA: 09/28/2016   DOS: 09/29/2016   Date of Service: the patient was seen and examined on 09/29/2016  Subjective: feeling better, still has weakness in legs and can not stand up.   Brief hospital course: Pt. with PMH of CVA with chronic right-sided weakness, hypertension, arthritis, obesity; admitted on 09/28/2016, presented with complaint of fall, was found to have UTI and a CVA. Currently further plan is continue current treatment.  Assessment and Plan: 1. Fall. Acute CVA lacunar infarct. Patient presented with a fall happened 2 days ago. CT scan unremarkable. MRI brain shows an acute infarct in the left hemisphere near posterior corona radiata. Discussed with neurology recommended to continue Plavix. Also recommend MRI head as well as further stroke workup. Will get hemoglobin A1c, lipid profile, echocardiogram, carotid Doppler. PTOT and speech therapy consulted already. CIR recommended for now. We'll monitor her CIR recommendation tomorrow.  2. Escherichia coli UTI. Follow up on sensitivities continue with IV ceftriaxone.  3. Ventricular megaly. No evidence of ataxia or urinary incontinence or worsening mentation over last few months. No evidence of normal pressure hydrocephalus. Likely chronic atrophy.  4. Acute kidney injury. No prior lab work available to compare. Serum creatinine elevated currently trending downwards. We will continue to monitor.  5.Hypokalemia. Replacing orally. We'll recheck tomorrow.  Diet: past swallowing evaluation. Continue cardiac diet DVT Prophylaxis: subcutaneous Heparin  Advance goals of care discussion: full code  Family Communication: family was present at bedside, at the time of interview. The pt provided permission to discuss medical plan with the family. Opportunity was given to ask question and all questions were  answered satisfactorily.   Disposition:  Discharge to CIR.  Consultants: neurology Procedures: none  Antibiotics: Anti-infectives    Start     Dose/Rate Route Frequency Ordered Stop   09/29/16 1400  cefTRIAXone (ROCEPHIN) 1 g in dextrose 5 % 50 mL IVPB     1 g 100 mL/hr over 30 Minutes Intravenous Every 24 hours 09/28/16 1539     09/28/16 1415  cefTRIAXone (ROCEPHIN) 2 g in dextrose 5 % 50 mL IVPB     2 g 100 mL/hr over 30 Minutes Intravenous  Once 09/28/16 1401 09/28/16 1715       Objective: Physical Exam: Vitals:   09/28/16 1650 09/28/16 2200 09/29/16 0627 09/29/16 1357  BP: 136/83 (!) 134/91 (!) 158/84 (!) 149/88  Pulse: 69 82 60 66  Resp: 17 18 18 16   Temp: 98.3 F (36.8 C) 98.5 F (36.9 C) 99 F (37.2 C) 98 F (36.7 C)  TempSrc: Oral Oral Oral Oral  SpO2: 95% 96% 96% 97%  Weight: 88.4 kg (194 lb 14.2 oz)     Height: 5\' 7"  (1.702 m)       Intake/Output Summary (Last 24 hours) at 09/29/16 1715 Last data filed at 09/29/16 0200  Gross per 24 hour  Intake          1371.25 ml  Output                0 ml  Net          1371.25 ml   Filed Weights   09/28/16 1119 09/28/16 1650  Weight: 92.1 kg (203 lb) 88.4 kg (194 lb 14.2 oz)   General: Alert, Awake and Oriented to Time, Place and Person. Appear in mild distress, affect appropriate Eyes: PERRL, Conjunctiva normal ENT: Oral Mucosa  clear moist. Neck: difficult to assess JVD, no Abnormal Mass Or lumps Cardiovascular: S1 and S2 Present, no Murmur, Peripheral Pulses Present Respiratory: normal respiratory effort, Bilateral Air entry equal and Decreased, no use of accessory muscle, Clear to Auscultation, no Crackles, no wheezes Abdomen: Bowel Sound present, Soft and no tenderness, no hernia Skin: no redness, no Rash, no induration Extremities: no Pedal edema, no calf tenderness Neurologic: Mental status AAOx3, speech normal, attention normal,  Cranial Nerves PERRL, EOM normal and present, right-sided facial droop  likely chronic Motor strength right upper and lower extremity weakness likely chronic 3 out of 5. Chronic spasticity in the right upper extremity. Sensation present to light touch,  Reflexes present knee and biceps, babinski difficult to elicit,  Cerebellar test normal finger nose finger. Gait not checked due to patient safety concerns.     Data Reviewed: CBC:  Recent Labs Lab 09/28/16 1230  WBC 6.1  NEUTROABS 3.9  HGB 15.1  HCT 41.7  MCV 88.5  PLT 161   Basic Metabolic Panel:  Recent Labs Lab 09/28/16 1230 09/29/16 0432  NA 145 142  K 3.4* 3.6  CL 113* 112*  CO2 24 22  GLUCOSE 109* 99  BUN 13 17  CREATININE 1.66* 1.59*  CALCIUM 8.6* 8.2*    Liver Function Tests:  Recent Labs Lab 09/28/16 1230  AST 21  ALT 16*  ALKPHOS 65  BILITOT 1.3*  PROT 6.7  ALBUMIN 3.6   No results for input(s): LIPASE, AMYLASE in the last 168 hours. No results for input(s): AMMONIA in the last 168 hours. Coagulation Profile: No results for input(s): INR, PROTIME in the last 168 hours. Cardiac Enzymes: No results for input(s): CKTOTAL, CKMB, CKMBINDEX, TROPONINI in the last 168 hours. BNP (last 3 results) No results for input(s): PROBNP in the last 8760 hours. CBG: No results for input(s): GLUCAP in the last 168 hours. Studies: Mr Brain Wo Contrast  Result Date: 09/29/2016 CLINICAL DATA:  Acute presentation with ataxia.  Assess for stroke. EXAM: MRI HEAD WITHOUT CONTRAST TECHNIQUE: Multiplanar, multiecho pulse sequences of the brain and surrounding structures were obtained without intravenous contrast. COMPARISON:  CT 09/28/2016 FINDINGS: Brain: diffusion imaging shows a 1 cm acute infarction on the left in the radiating white matter tracts of the posterior corona radiata. No cortical or large vessel territory infarction. Elsewhere, there chronic small-vessel ischemic changes affecting the pons. There are old small vessel cerebellar infarctions. Cerebral hemispheres show old small  vessel infarctions affecting the thalami, basal ganglia and hemispheric white matter. No mass lesion, hemorrhage, hydrocephalus or extra-axial collection. Vascular: Major vessels at the base of the brain show flow. Skull and upper cervical spine: Negative Sinuses/Orbits: Clear except for retention cyst in the right maxillary sinus and right division of the sphenoid sinus. Orbits negative. Other: None IMPRESSION: Acute 1 cm infarction in the left hemisphere radiating white matter tracts/posterior corona radiata. No hemorrhage or mass effect. Extensive chronic small vessel ischemic changes elsewhere throughout the brain. Electronically Signed   By: Paulina Fusi M.D.   On: 09/29/2016 13:45    Scheduled Meds: .  stroke: mapping our early stages of recovery book   Does not apply Once  . clopidogrel  75 mg Oral Daily  . enoxaparin (LOVENOX) injection  40 mg Subcutaneous Q24H  . finasteride  5 mg Oral Daily  . simvastatin  20 mg Oral Daily   Continuous Infusions: . cefTRIAXone (ROCEPHIN)  IV Stopped (09/29/16 1436)   PRN Meds:   Time spent: 35 minutes  Author: Lynden Oxford, MD [facility] Pager: [phone] 09/29/2016 5:15 PM  If 7PM-7AM, please contact night-coverage at [email or web address], password [facility]

## 2016-09-29 NOTE — Evaluation (Signed)
Physical Therapy Evaluation Patient Details Name: Robert Buckley MRN: [id number] DOB: September 29, 1945 Today's Date: 09/29/2016   History of Present Illness  Robert Buckley is a 71 y.o. male with medical history significant for HTN, OA, stroke CVA with residual right-sided weakness admitted with worsening of the right-sided weakness. Patient had a fall 2 days ago at home while he was trying to get up from chair. Ever since he has not been able to walk; MRI + for acute infarct  Clinical Impression  Pt admitted with above diagnosis. Pt currently with functional limitations due to the deficits listed below (see PT Problem List).  Pt will benefit from skilled PT to increase their independence and safety with mobility to allow discharge to the venue listed below.   Pt with mild residual R hemiparesis d/t CVA in 2010, he is modified independent household ambulator at baseline, independent with ADLs (bathing&dressing); may benefit from CIR depending on progress will follow in acute setting     Follow Up Recommendations CIR (if not HHPT and 24 hr assist)    Equipment Recommendations  None recommended by PT    Recommendations for Other Services       Precautions / Restrictions Precautions Precautions: Fall Restrictions Weight Bearing Restrictions: No      Mobility  Bed Mobility Overal bed mobility: Needs Assistance Bed Mobility: Sit to Supine;Rolling;Sidelying to Sit Rolling: Min assist Sidelying to sit: Min assist   Sit to supine: Min guard   General bed mobility comments: assist to elevated trunk, min/guard to bring LEs onto bed  Transfers Overall transfer level: Needs assistance Equipment used: Rolling walker (2 wheeled) Transfers: Sit to/from Stand Sit to Stand: Min assist;+2 safety/equipment;+2 physical assistance         General transfer comment:  (pt declined tranfser back to chair)  Ambulation/Gait             General Gait Details: 2 lateral steps along EOB with +2  min assist and RW, anxious regarding wt shfit/amb  Stairs            Wheelchair Mobility    Modified Rankin (Stroke Patients Only)       Balance Overall balance assessment: Needs assistance;History of Falls Sitting-balance support: Feet supported;Single extremity supported Sitting balance-Leahy Scale: Fair     Standing balance support: Bilateral upper extremity supported Standing balance-Leahy Scale: Poor Standing balance comment: reliant on assist and UE support                             Pertinent Vitals/Pain Pain Assessment: No/denies pain    Home Living Family/patient expects to be discharged to:: Unsure Living Arrangements: Children               Additional Comments: lives in handicapped accessible apt with his 2 sons    Prior Function Level of Independence: Independent with assistive device(s);Independent         Comments: amb with RW mod I, able to shower/dress self (sons help  with transfer only); sons assist with much of household duties     Hand Dominance        Extremity/Trunk Assessment   Upper Extremity Assessment Upper Extremity Assessment: RUE deficits/detail RUE Deficits / Details: AAROM WFL all planes; grip poor;     Lower Extremity Assessment Lower Extremity Assessment: RLE deficits/detail RLE Deficits / Details: AAROM WFL; ankle 2/5; knee and hip initially 2+/5 on testing but appeared better with functional tasks  Communication   Communication: No difficulties  Cognition Arousal/Alertness: Awake/alert Behavior During Therapy: WFL for tasks assessed/performed Overall Cognitive Status: Within Functional Limits for tasks assessed                                        General Comments      Exercises     Assessment/Plan    PT Assessment Patient needs continued PT services  PT Problem List Decreased strength;Decreased activity tolerance;Decreased balance;Decreased mobility;Decreased  safety awareness       PT Treatment Interventions DME instruction;Gait training;Functional mobility training;Therapeutic activities;Therapeutic exercise;Patient/family education;Balance training    PT Goals (Current goals can be found in the Care Plan section)  Acute Rehab PT Goals Patient Stated Goal: to get better PT Goal Formulation: With patient Time For Goal Achievement: 10/06/16 Potential to Achieve Goals: Good    Frequency Min 4X/week   Barriers to discharge        Co-evaluation               AM-PAC PT "6 Clicks" Daily Activity  Outcome Measure Difficulty turning over in bed (including adjusting bedclothes, sheets and blankets)?: Unable Difficulty moving from lying on back to sitting on the side of the bed? : Unable Difficulty sitting down on and standing up from a chair with arms (e.g., wheelchair, bedside commode, etc,.)?: Unable Help needed moving to and from a bed to chair (including a wheelchair)?: A Lot Help needed walking in hospital room?: A Lot Help needed climbing 3-5 steps with a railing? : A Lot 6 Click Score: 9    End of Session Equipment Utilized During Treatment: Gait belt Activity Tolerance: Patient tolerated treatment well Patient left: with call bell/phone within reach;in bed;with bed alarm set   PT Visit Diagnosis: Hemiplegia and hemiparesis Hemiplegia - Right/Left: Right Hemiplegia - caused by: Cerebral infarction    Time: 1533-1601 PT Time Calculation (min) (ACUTE ONLY): 28 min   Charges:   PT Evaluation $PT Eval Low Complexity: 1 Low PT Treatments $Therapeutic Activity: 8-22 mins   PT G Codes:          Robert Buckley 2016/10/21, 4:25 PM

## 2016-09-29 NOTE — Progress Notes (Signed)
Rehab Admissions Coordinator Note:  Patient was screened by Clois Dupes for appropriateness for an Inpatient Acute Rehab Consult per PT recommendation. I will follow up tomorrow with pt and family to begin discussions concerning their rehab venue options and then I will follow up with Rehab MD to clarify if pt would be a candidate for inpt rehab admission.   Clois Dupes 09/29/2016, 7:12 PM  I can be reached at [phone].

## 2016-09-30 ENCOUNTER — Inpatient Hospital Stay (HOSPITAL_COMMUNITY): Payer: Medicare Other

## 2016-09-30 ENCOUNTER — Encounter (HOSPITAL_COMMUNITY): Payer: Self-pay | Admitting: Internal Medicine

## 2016-09-30 DIAGNOSIS — I639 Cerebral infarction, unspecified: Secondary | ICD-10-CM | POA: Diagnosis present

## 2016-09-30 DIAGNOSIS — N179 Acute kidney failure, unspecified: Secondary | ICD-10-CM | POA: Diagnosis present

## 2016-09-30 DIAGNOSIS — W19XXXA Unspecified fall, initial encounter: Secondary | ICD-10-CM

## 2016-09-30 DIAGNOSIS — E876 Hypokalemia: Secondary | ICD-10-CM | POA: Diagnosis present

## 2016-09-30 DIAGNOSIS — I5030 Unspecified diastolic (congestive) heart failure: Secondary | ICD-10-CM

## 2016-09-30 DIAGNOSIS — N189 Chronic kidney disease, unspecified: Secondary | ICD-10-CM | POA: Diagnosis present

## 2016-09-30 DIAGNOSIS — I10 Essential (primary) hypertension: Secondary | ICD-10-CM | POA: Diagnosis present

## 2016-09-30 DIAGNOSIS — Y92009 Unspecified place in unspecified non-institutional (private) residence as the place of occurrence of the external cause: Secondary | ICD-10-CM

## 2016-09-30 DIAGNOSIS — E785 Hyperlipidemia, unspecified: Secondary | ICD-10-CM | POA: Diagnosis present

## 2016-09-30 DIAGNOSIS — R270 Ataxia, unspecified: Secondary | ICD-10-CM

## 2016-09-30 LAB — MAGNESIUM: Magnesium: 1.6 mg/dL — ABNORMAL LOW (ref 1.7–2.4)

## 2016-09-30 LAB — LIPID PANEL
CHOL/HDL RATIO: 2.8 ratio
Cholesterol: 114 mg/dL (ref 0–200)
HDL: 41 mg/dL (ref >40)
LDL CALC: 45 mg/dL (ref 0–99)
Triglycerides: 142 mg/dL (ref <150)
VLDL: 28 mg/dL (ref 0–40)

## 2016-09-30 LAB — CBC
HCT: 40.7 % (ref 39.0–52.0)
Hemoglobin: 14.2 g/dL (ref 13.0–17.0)
MCH: 30.4 pg (ref 26.0–34.0)
MCHC: 34.9 g/dL (ref 30.0–36.0)
MCV: 87.2 fL (ref 78.0–100.0)
Platelets: 143 10*3/uL — ABNORMAL LOW (ref 150–400)
RBC: 4.67 MIL/uL (ref 4.22–5.81)
RDW: 13.5 % (ref 11.5–15.5)
WBC: 5.7 10*3/uL (ref 4.0–10.5)

## 2016-09-30 LAB — URINE CULTURE: Culture: >=100000 — AB

## 2016-09-30 LAB — BASIC METABOLIC PANEL
ANION GAP: 7 (ref 5–15)
BUN: 16 mg/dL (ref 6–20)
CALCIUM: 8.3 mg/dL — AB (ref 8.9–10.3)
CHLORIDE: 112 mmol/L — AB (ref 101–111)
CO2: 22 mmol/L (ref 22–32)
CREATININE: 1.61 mg/dL — AB (ref 0.61–1.24)
GFR calc non Af Amer: 42 mL/min — ABNORMAL LOW (ref >60)
GFR, EST AFRICAN AMERICAN: 48 mL/min — AB (ref >60)
GLUCOSE: 99 mg/dL (ref 65–99)
Potassium: 3.6 mmol/L (ref 3.5–5.1)
Sodium: 141 mmol/L (ref 135–145)

## 2016-09-30 LAB — VAS US CAROTID
LCCADDIAS: -20 cm/s
LCCADSYS: -86 cm/s
LCCAPSYS: 119 cm/s
LEFT ECA DIAS: -12 cm/s
LEFT VERTEBRAL DIAS: 13 cm/s
LICADSYS: -67 cm/s
LICAPSYS: -49 cm/s
Left CCA prox dias: 17 cm/s
Left ICA dist dias: -20 cm/s
Left ICA prox dias: -15 cm/s
RCCADSYS: -49 cm/s
RCCAPSYS: 84 cm/s
RIGHT ECA DIAS: -12 cm/s
Right CCA prox dias: 16 cm/s

## 2016-09-30 LAB — ECHOCARDIOGRAM COMPLETE
HEIGHTINCHES: 67 in
WEIGHTICAEL: 3118.19 [oz_av]

## 2016-09-30 MED ORDER — ATORVASTATIN CALCIUM 40 MG PO TABS
40.0000 mg | ORAL_TABLET | Freq: Every day | ORAL | Status: DC
  Administered 2016-09-30: 40 mg via ORAL
  Filled 2016-09-30: qty 1

## 2016-09-30 MED ORDER — MAGNESIUM SULFATE 2 GM/50ML IV SOLN
2.0000 g | Freq: Once | INTRAVENOUS | Status: AC
  Administered 2016-09-30: 2 g via INTRAVENOUS
  Filled 2016-09-30: qty 50

## 2016-09-30 NOTE — Progress Notes (Addendum)
*  PRELIMINARY RESULTS* Vascular Ultrasound Carotid Duplex (Doppler) has been completed. Findings suggest 1-39% internal carotid artery stenosis bilaterally. The left vertebral artery is patent with antegrade flow. Unable to visualize the right vertebral artery.  09/30/2016 9:47 AM Gertie Fey, BS, RVT, RDCS, RDMS

## 2016-09-30 NOTE — H&P (Signed)
Physical Medicine and Rehabilitation Admission H&P    Chief Complaint  Patient presents with  . 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 Per chart review patient lives in a handicapped accessible apartment. Ambulated with a rolling walker modified independent prior to admission. He was able to shower and dress himself. He has 2 sons who assist with much of his household duties. Presented 09/28/2016 after a recent fall 09/26/2016 without loss of consciousness while trying to get up from his chair as well as reported increased right sided weakness. Cranial CT scan with no acute changes. Lumbar spine films negative. MRI showed acute 1 cm infarction in the left hemisphere radiating white at her tracts posterior corona radiata. No hemorrhage or mass effect. Patient did not receive TPA.Carotid Dopplers with no ICA stenosis.  Echocardiogram with ejection fraction of 67% grade 1 diastolic dysfunction.Marland KitchenMRA with no large vessel occlusion aneurysm or significant stenosis. Currently continues on Plavix for CVA prophylaxis. Subcutaneous Lovenox for DVT prophylaxis. Creatinine 1.66 on admission and no prior labs for comparison and latest creatinine 1.48. Tolerating a regular consistency diet. Urine culture greater than 100,000 Escherichia coli  maintained on Rocephin and changed to Keflex 09/11/2016. Blood culture showing no growth. Physical therapy evaluation completed with recommendations of physical medicine rehabilitation consult.  Review of Systems  Constitutional: Negative for chills and fever.  HENT: Negative for hearing loss.   Eyes: Negative for blurred vision, double vision and discharge.  Respiratory: Negative for cough and shortness of breath.   Cardiovascular: Positive for leg swelling. Negative for chest pain and palpitations.  Gastrointestinal: Positive for  constipation. Negative for nausea and vomiting.  Genitourinary: Positive for urgency.  Musculoskeletal: Positive for myalgias. Negative for joint pain.  Skin: Negative for rash.  Neurological: Positive for focal weakness. Negative for dizziness and seizures.  All other systems reviewed and are negative.  Past Medical History:  Diagnosis Date  . Arthritis   . Hypertension   . Stroke [facility]) 2008   residual R sided weakness   History reviewed. No pertinent surgical history. History reviewed. No pertinent family history. Social History:  reports that he has never smoked. He has never used smokeless tobacco. He reports that he does not drink alcohol or use drugs. Allergies:  Allergies  Allergen Reactions  . Sulfa Antibiotics Rash   Medications Prior to Admission  Medication Sig Dispense Refill  . amLODipine (NORVASC) 5 MG tablet Take 5 mg by mouth daily.    . clopidogrel (PLAVIX) 75 MG tablet Take 75 mg by mouth daily.    . finasteride (PROSCAR) 5 MG tablet Take 5 mg by mouth daily.    . metoprolol succinate (TOPROL-XL) 50 MG 24 hr tablet Take 50 mg by mouth daily. Take with or immediately following a meal.    . ramipril (ALTACE) 10 MG capsule Take 10 mg by mouth daily.    . simvastatin (ZOCOR) 20 MG tablet Take 20 mg by mouth daily.      Home: Home Living Family/patient expects to be discharged to:: Unsure Living Arrangements: Children Additional Comments: lives in handicapped accessible apt with his 2 sons   Functional History: Prior Function Level of Independence: Independent with assistive device(s), Independent Comments: amb with RW mod I, able to shower/dress self (sons help  with transfer only); sons assist with much of household duties  Functional Status:  Mobility: Bed Mobility  Overal bed mobility: Needs Assistance Bed Mobility: Sit to Supine, Rolling, Sidelying to Sit Rolling: Min assist Sidelying to sit: Min assist Sit to supine: Min guard General bed mobility  comments: assist to elevated trunk, min/guard to bring LEs onto bed Transfers Overall transfer level: Needs assistance Equipment used: Rolling walker (2 wheeled) Transfers: Sit to/from Stand Sit to Stand: Min assist, +2 safety/equipment, +2 physical assistance General transfer comment:  (pt declined tranfser back to chair) Ambulation/Gait General Gait Details: 2 lateral steps along EOB with +2 min assist and RW, anxious regarding wt shfit/amb    ADL:    Cognition: Cognition Overall Cognitive Status: Within Functional Limits for tasks assessed Orientation Level: Oriented X4 Cognition Arousal/Alertness: Awake/alert Behavior During Therapy: WFL for tasks assessed/performed Overall Cognitive Status: Within Functional Limits for tasks assessed  Physical Exam: Blood pressure (!) 154/91, pulse 68, temperature 97.7 F (36.5 C), temperature source Oral, resp. rate 18, height _0  (1.702 m), weight 88.4 kg (194 lb 14.2 oz), SpO2 97 %. Physical Exam  HENT:  Head: Normocephalic.  Right Ear: External ear normal.  Left Ear: External ear normal.  Eyes: Pupils are equal, round, and reactive to light. EOM are normal.  Neck: Normal range of motion. Neck supple. No JVD present. No tracheal deviation present. No thyromegaly present.  Cardiovascular: Normal rate, regular rhythm and normal heart sounds.  Exam reveals no gallop and no friction rub.   No murmur heard. Respiratory: Effort normal and breath sounds normal. No respiratory distress. He has no wheezes. He has no rales.  GI: Soft. Bowel sounds are normal. He exhibits no distension.  Skin. .Warm and dry Neurological. Alert. Follows commands. He does show some perseveration and decrease in attention as well as delay in processing. Oriented to person, place, date. He can provide his name and age.RUE 2+ to 3-/5 deltoid, biceps, wrist, HI. RLE: 2- to 2+/5 HF, KE, ADF/PF.  LUE 4+/5, LLE: 4 to 4+/5 prox to distal. Senses pain in all 4 but sensation  is "different" in right arm and leg. DTR's 1+.  Psych : a little anxious but generally pleasant   Results for orders placed or performed during the hospital encounter of 09/28/16 (from the past 48 hour(s))  Urine culture     Status: Abnormal (Preliminary result)   Collection Time: 09/28/16 11:05 AM  Result Value Ref Range   Specimen Description URINE, RANDOM    Special Requests NONE    Culture >=100,000 COLONIES/mL ESCHERICHIA COLI (A)    Report Status PENDING   Urinalysis, Routine w reflex microscopic     Status: Abnormal   Collection Time: 09/28/16 11:50 AM  Result Value Ref Range   Color, Urine YELLOW YELLOW   APPearance HAZY (A) CLEAR   Specific Gravity, Urine 1.014 1.005 - 1.030   pH 5.0 5.0 - 8.0   Glucose, UA NEGATIVE NEGATIVE mg/dL   Hgb urine dipstick SMALL (A) NEGATIVE   Bilirubin Urine NEGATIVE NEGATIVE   Ketones, ur NEGATIVE NEGATIVE mg/dL   Protein, ur 100 (A) NEGATIVE mg/dL   Nitrite POSITIVE (A) NEGATIVE   Leukocytes, UA LARGE (A) NEGATIVE   RBC / HPF 0-5 0 - 5 RBC/hpf   WBC, UA TOO NUMEROUS TO COUNT 0 - 5 WBC/hpf   Bacteria, UA MANY (A) NONE SEEN   Squamous Epithelial / LPF 0-5 (A) NONE SEEN   WBC Clumps PRESENT    Mucous PRESENT   CBC with Differential     Status: Abnormal   Collection Time: 09/28/16 12:30  PM  Result Value Ref Range   WBC 6.1 4.0 - 10.5 K/uL   RBC 4.71 4.22 - 5.81 MIL/uL   Hemoglobin 15.1 13.0 - 17.0 g/dL   HCT 41.7 39.0 - 52.0 %   MCV 88.5 78.0 - 100.0 fL   MCH 32.1 26.0 - 34.0 pg   MCHC 36.2 (H) 30.0 - 36.0 g/dL   RDW 13.4 11.5 - 15.5 %   Platelets 161 150 - 400 K/uL   Neutrophils Relative % 64 %   Neutro Abs 3.9 1.7 - 7.7 K/uL   Lymphocytes Relative 25 %   Lymphs Abs 1.5 0.7 - 4.0 K/uL   Monocytes Relative 9 %   Monocytes Absolute 0.6 0.1 - 1.0 K/uL   Eosinophils Relative 2 %   Eosinophils Absolute 0.1 0.0 - 0.7 K/uL   Basophils Relative 0 %   Basophils Absolute 0.0 0.0 - 0.1 K/uL  Comprehensive metabolic panel     Status:  Abnormal   Collection Time: 09/28/16 12:30 PM  Result Value Ref Range   Sodium 145 135 - 145 mmol/L   Potassium 3.4 (L) 3.5 - 5.1 mmol/L   Chloride 113 (H) 101 - 111 mmol/L   CO2 24 22 - 32 mmol/L   Glucose, Bld 109 (H) 65 - 99 mg/dL   BUN 13 6 - 20 mg/dL   Creatinine, Ser 1.66 (H) 0.61 - 1.24 mg/dL   Calcium 8.6 (L) 8.9 - 10.3 mg/dL   Total Protein 6.7 6.5 - 8.1 g/dL   Albumin 3.6 3.5 - 5.0 g/dL   AST 21 15 - 41 U/L   ALT 16 (L) 17 - 63 U/L   Alkaline Phosphatase 65 38 - 126 U/L   Total Bilirubin 1.3 (H) 0.3 - 1.2 mg/dL   GFR calc non Af Amer 40 (L) >60 mL/min   GFR calc Af Amer 47 (L) >60 mL/min    Comment: (NOTE) The eGFR has been calculated using the CKD EPI equation. This calculation has not been validated in all clinical situations. eGFR's persistently <60 mL/min signify possible Chronic Kidney Disease.    Anion gap 8 5 - 15  I-Stat Troponin, ED (not at [facility])     Status: None   Collection Time: 09/28/16 12:41 PM  Result Value Ref Range   Troponin i, poc 0.01 0.00 - 0.08 ng/mL   Comment 3            Comment: Due to the release kinetics of cTnI, a negative result within the first hours of the onset of symptoms does not rule out myocardial infarction with certainty. If myocardial infarction is still suspected, repeat the test at appropriate intervals.   Blood culture (routine x 2)     Status: None (Preliminary result)   Collection Time: 09/28/16  2:07 PM  Result Value Ref Range   Specimen Description BLOOD LEFT HAND    Special Requests      BOTTLES DRAWN AEROBIC AND ANAEROBIC Blood Culture results may not be optimal due to an inadequate volume of blood received in culture bottles   Culture      NO GROWTH < 24 HOURS Performed at [facility] [address], [place], [place] [address]    Report Status PENDING   Blood culture (routine x 2)     Status: None (Preliminary result)   Collection Time: 09/28/16  2:27 PM  Result Value Ref Range   Specimen Description  BLOOD LEFT ANTECUBITAL    Special Requests  BOTTLES DRAWN AEROBIC AND ANAEROBIC Blood Culture results may not be optimal due to an inadequate volume of blood received in culture bottles   Culture  Setup Time      GRAM POSITIVE COCCI IN CLUSTERS IN BOTH AEROBIC AND ANAEROBIC BOTTLES CRITICAL RESULT CALLED TO, READ BACK BY AND VERIFIED WITH: E WILLIAMSON,PHARMD AT 1347 09/29/16 BY L BENFIELD Performed at [facility], [place] [address] [place], [place] [address]    Culture GRAM POSITIVE COCCI    Report Status PENDING   Blood Culture ID Panel (Reflexed)     Status: Abnormal   Collection Time: 09/28/16  2:27 PM  Result Value Ref Range   Enterococcus species NOT DETECTED NOT DETECTED   Vancomycin resistance NOT DETECTED NOT DETECTED   Listeria monocytogenes NOT DETECTED NOT DETECTED   Staphylococcus species DETECTED (A) NOT DETECTED    Comment: CRITICAL RESULT CALLED TO, READ BACK BY AND VERIFIED WITH: E WILLIAMSON,PHARMD AT 1347 09/29/16 BY L BENFIELD    Staphylococcus aureus NOT DETECTED NOT DETECTED   Methicillin resistance NOT DETECTED NOT DETECTED   Streptococcus species NOT DETECTED NOT DETECTED   Streptococcus agalactiae NOT DETECTED NOT DETECTED   Streptococcus pneumoniae NOT DETECTED NOT DETECTED   Streptococcus pyogenes NOT DETECTED NOT DETECTED   Acinetobacter baumannii NOT DETECTED NOT DETECTED   Enterobacteriaceae species NOT DETECTED NOT DETECTED   Enterobacter cloacae complex NOT DETECTED NOT DETECTED   Escherichia coli NOT DETECTED NOT DETECTED   Klebsiella oxytoca NOT DETECTED NOT DETECTED   Klebsiella pneumoniae NOT DETECTED NOT DETECTED   Proteus species NOT DETECTED NOT DETECTED   Serratia marcescens NOT DETECTED NOT DETECTED   Carbapenem resistance NOT DETECTED NOT DETECTED   Haemophilus influenzae NOT DETECTED NOT DETECTED   Neisseria meningitidis NOT DETECTED NOT DETECTED   Pseudomonas aeruginosa NOT DETECTED NOT DETECTED   Candida albicans NOT DETECTED  NOT DETECTED   Candida glabrata NOT DETECTED NOT DETECTED   Candida krusei NOT DETECTED NOT DETECTED   Candida parapsilosis NOT DETECTED NOT DETECTED   Candida tropicalis NOT DETECTED NOT DETECTED    Comment: Performed at [facility], [address], [place], [place] [address]  Basic metabolic panel     Status: Abnormal   Collection Time: 09/29/16  4:32 AM  Result Value Ref Range   Sodium 142 135 - 145 mmol/L   Potassium 3.6 3.5 - 5.1 mmol/L   Chloride 112 (H) 101 - 111 mmol/L   CO2 22 22 - 32 mmol/L   Glucose, Bld 99 65 - 99 mg/dL   BUN 17 6 - 20 mg/dL   Creatinine, Ser 1.59 (H) 0.61 - 1.24 mg/dL   Calcium 8.2 (L) 8.9 - 10.3 mg/dL   GFR calc non Af Amer 42 (L) >60 mL/min   GFR calc Af Amer 49 (L) >60 mL/min    Comment: (NOTE) The eGFR has been calculated using the CKD EPI equation. This calculation has not been validated in all clinical situations. eGFR's persistently <60 mL/min signify possible Chronic Kidney Disease.    Anion gap 8 5 - 15  Basic metabolic panel     Status: Abnormal   Collection Time: 09/30/16  4:59 AM  Result Value Ref Range   Sodium 141 135 - 145 mmol/L   Potassium 3.6 3.5 - 5.1 mmol/L   Chloride 112 (H) 101 - 111 mmol/L   CO2 22 22 - 32 mmol/L   Glucose, Bld 99 65 - 99 mg/dL   BUN 16 6 -  20 mg/dL   Creatinine, Ser 1.61 (H) 0.61 - 1.24 mg/dL   Calcium 8.3 (L) 8.9 - 10.3 mg/dL   GFR calc non Af Amer 42 (L) >60 mL/min   GFR calc Af Amer 48 (L) >60 mL/min    Comment: (NOTE) The eGFR has been calculated using the CKD EPI equation. This calculation has not been validated in all clinical situations. eGFR's persistently <60 mL/min signify possible Chronic Kidney Disease.    Anion gap 7 5 - 15  CBC     Status: Abnormal   Collection Time: 09/30/16  4:59 AM  Result Value Ref Range   WBC 5.7 4.0 - 10.5 K/uL   RBC 4.67 4.22 - 5.81 MIL/uL   Hemoglobin 14.2 13.0 - 17.0 g/dL   HCT 40.7 39.0 - 52.0 %   MCV 87.2 78.0 - 100.0 fL   MCH 30.4 26.0 - 34.0  pg   MCHC 34.9 30.0 - 36.0 g/dL   RDW 13.5 11.5 - 15.5 %   Platelets 143 (L) 150 - 400 K/uL  Magnesium     Status: Abnormal   Collection Time: 09/30/16  4:59 AM  Result Value Ref Range   Magnesium 1.6 (L) 1.7 - 2.4 mg/dL   Dg Lumbar Spine Complete  Result Date: 09/28/2016 CLINICAL DATA:  Pt had a fall 2 days ago at home after trying to get up from couch to go to the bathroom. Hx of stroke in 2008, with residual right sided weakness. States since his fall, he has not been able to feel his right leg and has no muscle control of it. EXAM: LUMBAR SPINE - COMPLETE 4+ VIEW COMPARISON:  None. FINDINGS: No fracture. Slight anterolisthesis of L4 on L5, degenerative in origin. No other spondylolisthesis. Moderate loss of disc height at L4-L5 and L5-S1. Endplate osteophytes noted most prominently at these levels. There is facet degenerative change bilaterally at L4-L5-L5-S1. Bones are demineralized. Soft tissues are unremarkable. IMPRESSION: 1. No fracture or acute finding. 2. Degenerative changes as detailed. Electronically Signed   By: Lajean Manes M.D.   On: 09/28/2016 12:27   Ct Head Wo Contrast  Result Date: 09/28/2016 CLINICAL DATA:  Fall 2 days ago. Fall yesterday as well. Dizziness. Stroke in 2008 right-sided weakness. EXAM: CT HEAD WITHOUT CONTRAST TECHNIQUE: Contiguous axial images were obtained from the base of the skull through the vertex without intravenous contrast. COMPARISON:  None. FINDINGS: Brain: No subdural, epidural, or subarachnoid hemorrhage. Cerebellum, brainstem, and basal cisterns are normal. Ventricles and sulci are prominent. White matter changes are identified. No acute cortical ischemia or infarct. No mass, mass effect, or midline shift. Vascular: Calcified atherosclerosis is seen in the intracranial carotid arteries. Skull: Normal. Negative for fracture or focal lesion. Sinuses/Orbits: No acute finding. Other: None. IMPRESSION: 1. No acute abnormalities identified. Chronic white  matter changes. Prominence of ventricles and sulci may represent volume loss. Given the history of falls, recommend correlation for symptoms of normal pressure hydrocephalus as well. Electronically Signed   By: Dorise Bullion III M.D   On: 09/28/2016 12:15   Mr Brain Wo Contrast  Result Date: 09/29/2016 CLINICAL DATA:  Acute presentation with ataxia.  Assess for stroke. EXAM: MRI HEAD WITHOUT CONTRAST TECHNIQUE: Multiplanar, multiecho pulse sequences of the brain and surrounding structures were obtained without intravenous contrast. COMPARISON:  CT 09/28/2016 FINDINGS: Brain: diffusion imaging shows a 1 cm acute infarction on the left in the radiating white matter tracts of the posterior corona radiata. No cortical or large vessel territory infarction.  Elsewhere, there chronic small-vessel ischemic changes affecting the pons. There are old small vessel cerebellar infarctions. Cerebral hemispheres show old small vessel infarctions affecting the thalami, basal ganglia and hemispheric white matter. No mass lesion, hemorrhage, hydrocephalus or extra-axial collection. Vascular: Major vessels at the base of the brain show flow. Skull and upper cervical spine: Negative Sinuses/Orbits: Clear except for retention cyst in the right maxillary sinus and right division of the sphenoid sinus. Orbits negative. Other: None IMPRESSION: Acute 1 cm infarction in the left hemisphere radiating white matter tracts/posterior corona radiata. No hemorrhage or mass effect. Extensive chronic small vessel ischemic changes elsewhere throughout the brain. Electronically Signed   By: Nelson Chimes M.D.   On: 09/29/2016 13:45       Medical Problem List and Plan: 1.  Right side weakness secondary to acute infarction left hemisphere/corona radiata. History of CVA 2010 with right-sided weakness  -admit to inpatient rehab 2.  DVT Prophylaxis/Anticoagulation: Subcutaneous Lovenox. Monitor for any bleeding episodes 3. Pain Management:  Tylenol as needed 4. Mood: Provide emotional support 5. Neuropsych: This patient is capable of making decisions on his own behalf. 6. Skin/Wound Care: Routine skin checks 7. Fluids/Electrolytes/Nutrition: Routine I&O with follow-up chemistries upon admit 8.AKI versus CRI. Follow-up chemistries 9. Escherichia coli urinary tract infection. Rocephin changed to Keflex 10/01/2016 10. Hyperlipidemia. Lipitor 11. Hypertension. Monitor with increased mobility 12. BPH. Proscar  -discussed moving away from condom cath.  -up out of bed to void when possible  Post Admission Physician Evaluation: 1. Functional deficits secondary  to left corona radiata infarct. 2. Patient is admitted to receive collaborative, interdisciplinary care between the physiatrist, rehab nursing staff, and therapy team. 3. Patient's level of medical complexity and substantial therapy needs in context of that medical necessity cannot be provided at a lesser intensity of care such as a SNF. 4. Patient has experienced substantial functional loss from his/her baseline which was documented above under the "Functional History" and "Functional Status" headings.  Judging by the patient's diagnosis, physical exam, and functional history, the patient has potential for functional progress which will result in measurable gains while on inpatient rehab.  These gains will be of substantial and practical use upon discharge  in facilitating mobility and self-care at the household level. 5. Physiatrist will provide 24 hour management of medical needs as well as oversight of the therapy plan/treatment and provide guidance as appropriate regarding the interaction of the two. 6. The Preadmission Screening has been reviewed and patient status is unchanged unless otherwise stated above. 7. 24 hour rehab nursing will assist with bladder management, bowel management, safety, skin/wound care, disease management, medication administration, pain management and  patient education  and help integrate therapy concepts, techniques,education, etc. 8. PT will assess and treat for/with: Lower extremity strength, range of motion, stamina, balance, functional mobility, safety, adaptive techniques and equipment, NMR, ego support, family education.   Goals are: supervision to mod I. 9. OT will assess and treat for/with: ADL's, functional mobility, safety, upper extremity strength, adaptive techniques and equipment, NMR, family ed, community reentry.   Goals are: supervision to mod I. Therapy may proceed with showering this patient. 10. SLP will assess and treat for/with: cognition, speech, communication.  Goals are: mod I to supervision. 11. Case Management and Social Worker will assess and treat for psychological issues and discharge planning. 12. Team conference will be held weekly to assess progress toward goals and to determine barriers to discharge. 13. Patient will receive at least 3 hours of therapy per  day at least 5 days per week. 14. ELOS: 10-14 days       15. Prognosis:  excellent     Meredith Staggers, MD, [facility] 10/01/2016  Cathlyn Parsons., PA-C 09/30/2016

## 2016-09-30 NOTE — Progress Notes (Signed)
I contacted pt's son, Tasia Catchings, by phone to discuss a possible inpt rehab admit vs SNF rehab. I will assess pt at bedside tomorrow morning at 0830 to assist in determining rehab potential. Son is aware and in agreement. [phone]

## 2016-09-30 NOTE — Evaluation (Signed)
Speech Language Pathology Evaluation Patient Details Name: Robert Buckley MRN: [id number] DOB: 02-05-1946 Today's Date: 09/30/2016 Time: 4098-1191 SLP Time Calculation (min) (ACUTE ONLY): 37 min  Problem List:  Patient Active Problem List   Diagnosis Date Noted  . UTI (urinary tract infection) 09/28/2016   Past Medical History:  Past Medical History:  Diagnosis Date  . Arthritis   . Hypertension   . Stroke [facility]) 2008   residual R sided weakness   Past Surgical History: History reviewed. No pertinent surgical history. HPI:  71 yo male referred for Speech and Language evaluation due to having been found to have a stroke (per MRI 8/18 1 cm cva white matter left corona radiata, extensive chronic ischemia) .  Pt was admitted 8/18 after falling at home and having worsening right HP - Tested positive for UTI, negative CT head.  PMH + for CVA in 2008 (pt states 2010),   Pt reports he resides with his two sons who work but they help him with his appointments, bills, medications, etc.    Assessment / Plan / Recommendation Clinical Impression  MOCA 7.3 was administered to pt with him scoring 8/25 - indicating severe cognitive linguistic deficits. Pt's primary deficit noted to be sustained attention which impaired every other area of cognition.  He did not recall being informed of his stroke nor the current city.  Pt states he has lived in [place] one year.  His expressive language for basic needs is functional but higher level communication impaired.  He requried several repetitions of tasks and perseverated x1 during session.    Speech is intelligible without dysarthria.  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    No family present to establish baseline skills.   Pt will benefit from follow up to decrease caregiver burden and maximize rehab.      SLP Assessment  SLP Recommendation/Assessment: Patient needs continued Speech  [place] SLP Visit Diagnosis: Attention and concentration deficit;Cognitive communication deficit (R41.841) Attention and concentration deficit following: Cerebral infarction    Follow Up Recommendations  Inpatient Rehab    Frequency and Duration min 2x/week  2 weeks      SLP Evaluation Cognition  Overall Cognitive Status: No family/caregiver present to determine baseline cognitive functioning Arousal/Alertness: Awake/alert Orientation Level: Oriented to person;Oriented to place;Disoriented to situation (pt needed cues to recall his stroke) Attention: Sustained Sustained Attention: Impaired Sustained Attention Impairment: Verbal basic;Functional basic Memory: Impaired Memory Impairment: Storage deficit;Retrieval deficit;Decreased recall of new information (identified 2/5 words with multiple choice) Awareness: Impaired (pt admits to premorbid memory deficits, ? if worsened now, no family present) Problem Solving: Impaired Problem Solving Impairment: Functional basic (admits that can't walk but uncertain if pt will call for assist) Behaviors: Perseveration Safety/Judgment: Impaired       Comprehension  Auditory Comprehension Overall Auditory Comprehension: Impaired Yes/No Questions: Not tested Commands: Impaired Conversation: Simple Interfering Components: Attention;Working memory;Processing speed EffectiveTechniques: Repetition Counsellor: Not tested Reading Comprehension Reading Status:  (for single words, pt able to read)    Expression Expression Primary Mode of Expression: Verbal Verbal Expression Overall Verbal Expression: Impaired Initiation: No impairment Level of Generative/Spontaneous Verbalization: Sentence Repetition: Impaired Level of Impairment: Sentence level Naming: Impairment (named 2/3 animals correctly, 3rd with semantic cue) Pragmatics: No impairment Written Expression Dominant Hand:  Right Written Expression: Not tested (pt with impact from prior cva - unable to write)   Oral / Motor  Oral Motor/Sensory Function Overall Oral  Motor/Sensory Function: Mild impairment Facial ROM: Reduced right Facial Symmetry: Abnormal symmetry right Facial Strength: Reduced right Facial Sensation: Reduced right Motor Speech Overall Motor Speech: Appears within functional limits for tasks assessed Respiration: Within functional limits Resonance: Within functional limits Articulation: Within functional limitis Intelligibility: Intelligible Motor Planning: Witnin functional limits Motor Speech Errors: Not applicable   GO                    Chales Abrahams 09/30/2016, 11:17 AM Donavan Burnet, MS [facility] SLP [phone]

## 2016-09-30 NOTE — Evaluation (Signed)
Occupational Therapy Evaluation Patient Details Name: Robert Buckley MRN: [id number] DOB: 10-03-1945 Today's Date: 09/30/2016    History of Present Illness Robert Buckley is a 71 y.o. male with medical history significant for HTN, OA, stroke CVA with residual right-sided weakness admitted with worsening of the right-sided weakness. Patient had a fall 2 days ago at home while he was trying to get up from chair. Ever since he has not been able to walk; MRI + for acute infarct   Clinical Impression   Pt was assisted for aspects of seated showering and all IADL prior to admission. He was otherwise performing at a modified independent level. Pt presents with R hemiparesis. He is able to use his R UE as a functional assist when compensating with his vision for decreased sensation. Pt currently requires set up to max assist for ADL and demonstrates poor standing balance, requiring B UE support. Will follow acutely. Recommending inpatient rehab for intensive therapy prior to return home with his sons.    Follow Up Recommendations  CIR    Equipment Recommendations  None recommended by OT    Recommendations for Other Services       Precautions / Restrictions Precautions Precautions: Fall Precaution Comments: R hemiparesis Restrictions Weight Bearing Restrictions: No      Mobility Bed Mobility Overal bed mobility: Needs Assistance Bed Mobility: Supine to Sit;Sit to Supine    Supine to sit: Min guard Sit to supine: Min guard   General bed mobility comments: no physical assist, increased time and use of rail  Transfers Overall transfer level: Needs assistance Equipment used: Rolling walker (2 wheeled) Transfers: Sit to/from [employer] Sit to Stand: Min assist Stand pivot transfers: Min assist       General transfer comment: difficulty maintaining grasp of walker with R UE, assist to move walker and to steady    Balance Overall balance assessment: Needs  assistance;History of Falls Sitting-balance support: Feet supported;Single extremity supported Sitting balance-Leahy Scale: Fair Sitting balance - Comments: static sitting     Standing balance-Leahy Scale: Poor Standing balance comment: reliant on assist and B UE support                           ADL either performed or assessed with clinical judgement   ADL Overall ADL's : Needs assistance/impaired Eating/Feeding: Independent;Bed level Eating/Feeding Details (indicate cue type and reason): pt used R UE to open containers as a gross assist Grooming: Wash/dry hands;Wash/dry face;Sitting;Supervision/safety   Upper Body Bathing: Minimal assistance;Sitting   Lower Body Bathing: Maximal assistance;Sit to/from stand   Upper Body Dressing : Moderate assistance;Sitting   Lower Body Dressing: Maximal assistance;Sit to/from stand   Toilet Transfer: Minimal assistance;Stand-pivot;RW   Toileting- Clothing Manipulation and Hygiene: Maximal assistance;Sit to/from stand       Functional mobility during ADLs: Minimal assistance (took several steps to [facility] with min assist)       Vision Baseline Vision/History: No visual deficits       Perception     Praxis      Pertinent Vitals/Pain Pain Assessment: No/denies pain     Hand Dominance Right   Extremity/Trunk Assessment Upper Extremity Assessment Upper Extremity Assessment: RUE deficits/detail RUE Deficits / Details: AROM FF 80 degrees, full elbow flexion, -10 degrees elbow extension, full forearm wrist and gross grasp with 4-/5 grip RUE Sensation: decreased light touch;decreased proprioception RUE Coordination: decreased fine motor;decreased gross motor (uses as a gross assist)   Lower Extremity  Assessment Lower Extremity Assessment: Defer to PT evaluation       Communication Communication Communication: No difficulties   Cognition Arousal/Alertness: Awake/alert Behavior During Therapy: WFL for tasks  assessed/performed Overall Cognitive Status: Within Functional Limits for tasks assessed                                     General Comments       Exercises     Shoulder Instructions      Home Living Family/patient expects to be discharged to:: Private residence Living Arrangements: Children (sons) Available Help at Discharge: Family;Available 24 hours/day Type of Home: Apartment Home Access: Level entry     Home Layout: One level     Bathroom Shower/Tub: Producer, television/film/video: Handicapped height     Home Equipment: Shower seat;Grab bars - tub/shower;Hand held Careers information officer - 4 wheels   Additional Comments: lives in handicapped accessible apt with his 2 sons      Prior Functioning/Environment Level of Independence: Needs assistance  Gait / Transfers Assistance Needed: walks with walker ADL's / Homemaking Assistance Needed: assisted for showering and all IADL, can self feed, sit to groom on his rollator and dress himself            OT Problem List: Decreased strength;Decreased activity tolerance;Impaired balance (sitting and/or standing);Decreased coordination;Decreased knowledge of use of DME or AE;Cardiopulmonary status limiting activity;Impaired UE functional use;Impaired sensation;Impaired tone      OT Treatment/Interventions: Self-care/ADL training;Neuromuscular education;DME and/or AE instruction;Therapeutic activities;Patient/family education;Balance training    OT Goals(Current goals can be found in the care plan section) Acute Rehab OT Goals Patient Stated Goal: to get better OT Goal Formulation: With patient Time For Goal Achievement: 10/14/16 Potential to Achieve Goals: Good ADL Goals Pt Will Perform Grooming: with set-up;sitting (at sink) Pt Will Perform Upper Body Bathing: with min assist;sitting Pt Will Perform Upper Body Dressing: with set-up;sitting Pt Will Perform Lower Body Dressing: with min assist;sit to/from  stand Pt Will Transfer to Toilet: with min guard assist;ambulating;bedside commode Pt Will Perform Toileting - Clothing Manipulation and hygiene: with min guard assist;sit to/from stand  OT Frequency: Min 3X/week   Barriers to D/C:            Co-evaluation              AM-PAC PT "6 Clicks" Daily Activity     Outcome Measure Help from another person eating meals?: None Help from another person taking care of personal grooming?: A Little Help from another person toileting, which includes using toliet, bedpan, or urinal?: A Lot Help from another person bathing (including washing, rinsing, drying)?: A Lot Help from another person to put on and taking off regular upper body clothing?: A Lot Help from another person to put on and taking off regular lower body clothing?: A Lot 6 Click Score: 15   End of Session Equipment Utilized During Treatment: Gait belt;Rolling walker  Activity Tolerance: Patient limited by fatigue Patient left: in bed;with call bell/phone within reach  OT Visit Diagnosis: Unsteadiness on feet (R26.81);Other abnormalities of gait and mobility (R26.89);History of falling (Z91.81);Muscle weakness (generalized) (M62.81);Hemiplegia and hemiparesis Hemiplegia - Right/Left: Right Hemiplegia - dominant/non-dominant: Dominant Hemiplegia - caused by: Cerebral infarction                Time: 4098-1191 OT Time Calculation (min): 19 min Charges:  OT General Charges $OT Visit: 1 Procedure  OT Evaluation $OT Eval Moderate Complexity: 1 Procedure G-Codes:     2016-10-03 Martie Round, OTR/L Pager: [phone] [place] Bailiff 10/03/2016, 3:07 PM

## 2016-09-30 NOTE — Progress Notes (Signed)
[facility] Progress Note  Patient: Robert Buckley XBD:[id number]   PCP: Patient, No Pcp Per DOB: July 19, 1945   DOA: 09/28/2016   DOS: 09/30/2016   Date of Service: the patient was seen and examined on 09/30/2016  Subjective: feeling better, still has weakness in legs and can not stand up.   Brief hospital course: Pt. with PMH of CVA with chronic right-sided weakness, hypertension, arthritis, obesity; admitted on 09/28/2016, presented with complaint of fall, was found to have UTI and a CVA. Currently further plan is continue current treatment.  Assessment and Plan: 1. Fall. Acute CVA lacunar infarct. Patient presented with a fall happened 2 days ago Before admission. CT scan unremarkable. MRI brain shows an acute infarct in the left hemisphere near posterior corona radiata. Discussed with neurology recommended to continue Plavix. Also recommend MRA head as well as further stroke workup. Will get hemoglobin A1c, lipid profile, echocardiogram, carotid Doppler. PTOT and speech therapy consulted already. CIR recommended for now. We'll monitor her CIR recommendation tomorrow.  2. Escherichia coli UTI. continue with IV ceftriaxone.  3. Ventricular megaly. No evidence of ataxia or urinary incontinence or worsening mentation over last few months. No evidence of normal pressure hydrocephalus. Likely chronic atrophy.  4. Acute kidney injury. No prior lab work available to compare. Serum creatinine elevated currently trending downwards. We will continue to monitor.  5.Hypokalemia. Replaced We'll recheck tomorrow.  Diet: Continue cardiac diet DVT Prophylaxis: subcutaneous Heparin  Advance goals of care discussion: full code  Family Communication: no family was present at bedside, at the time of interview.   Disposition:  Discharge to CIR.  Consultants: neurology Procedures: Echocardiogram   Antibiotics: Anti-infectives    Start     Dose/Rate Route Frequency Ordered Stop     09/29/16 1400  cefTRIAXone (ROCEPHIN) 1 g in dextrose 5 % 50 mL IVPB     1 g 100 mL/hr over 30 Minutes Intravenous Every 24 hours 09/28/16 1539     09/28/16 1415  cefTRIAXone (ROCEPHIN) 2 g in dextrose 5 % 50 mL IVPB     2 g 100 mL/hr over 30 Minutes Intravenous  Once 09/28/16 1401 09/28/16 1715       Objective: Physical Exam: Vitals:   09/29/16 1357 09/29/16 2230 09/30/16 0547 09/30/16 1544  BP: (!) 149/88 (!) 139/95 (!) 154/91 (!) 151/79  Pulse: 66 66 68 81  Resp: 16 18 18 18   Temp: 98 F (36.7 C) 98.1 F (36.7 C) 97.7 F (36.5 C) 98 F (36.7 C)  TempSrc: Oral Oral Oral Oral  SpO2: 97% 97% 97% 98%  Weight:      Height:        Intake/Output Summary (Last 24 hours) at 09/30/16 1613 Last data filed at 09/30/16 1423  Gross per 24 hour  Intake              340 ml  Output              825 ml  Net             -485 ml   Filed Weights   09/28/16 1119 09/28/16 1650  Weight: 92.1 kg (203 lb) 88.4 kg (194 lb 14.2 oz)   General: Alert, Awake and Oriented to Time, Place and Person. Appear in mild distress, affect appropriate Eyes: PERRL, Conjunctiva normal ENT: Oral Mucosa clear moist. Neck: difficult to assess JVD, no Abnormal Mass Or lumps Cardiovascular: S1 and S2 Present, no Murmur, Peripheral Pulses Present Respiratory: normal respiratory effort, Bilateral Air  entry equal and Decreased, no use of accessory muscle, Clear to Auscultation, no Crackles, no wheezes Abdomen: Bowel Sound present, Soft and no tenderness, no hernia Skin: no redness, no Rash, no induration Extremities: no Pedal edema, no calf tenderness Neurologic: Mental status AAOx3, Mild dysarthria, attention normal,  Cranial Nerves PERRL, EOM normal and present, right-sided facial droop likely chronic Motor strength right upper and lower extremity weakness likely chronic 3 out of 5. Chronic spasticity in the right upper extremity. Sensation present to light touch,  Reflexes present knee and biceps, babinski  difficult to elicit,  Cerebellar test normal finger nose finger. Gait not checked due to patient safety concerns.     Data Reviewed: CBC:  Recent Labs Lab 09/28/16 1230 09/30/16 0459  WBC 6.1 5.7  NEUTROABS 3.9  --   HGB 15.1 14.2  HCT 41.7 40.7  MCV 88.5 87.2  PLT 161 143*   Basic Metabolic Panel:  Recent Labs Lab 09/28/16 1230 09/29/16 0432 09/30/16 0459  NA 145 142 141  K 3.4* 3.6 3.6  CL 113* 112* 112*  CO2 24 22 22   GLUCOSE 109* 99 99  BUN 13 17 16   CREATININE 1.66* 1.59* 1.61*  CALCIUM 8.6* 8.2* 8.3*  MG  --   --  1.6*    Liver Function Tests:  Recent Labs Lab 09/28/16 1230  AST 21  ALT 16*  ALKPHOS 65  BILITOT 1.3*  PROT 6.7  ALBUMIN 3.6   No results for input(s): LIPASE, AMYLASE in the last 168 hours. No results for input(s): AMMONIA in the last 168 hours. Coagulation Profile: No results for input(s): INR, PROTIME in the last 168 hours. Cardiac Enzymes: No results for input(s): CKTOTAL, CKMB, CKMBINDEX, TROPONINI in the last 168 hours. BNP (last 3 results) No results for input(s): PROBNP in the last 8760 hours. CBG: No results for input(s): GLUCAP in the last 168 hours. Studies: No results found.  Scheduled Meds: . atorvastatin  40 mg Oral q1800  . clopidogrel  75 mg Oral Daily  . enoxaparin (LOVENOX) injection  40 mg Subcutaneous Q24H  . finasteride  5 mg Oral Daily   Continuous Infusions: . cefTRIAXone (ROCEPHIN)  IV Stopped (09/30/16 1423)   PRN Meds:   Time spent: 35 minutes  Author: Lynden Oxford, MD [facility] Pager: [phone] 09/30/2016 4:13 PM  If 7PM-7AM, please contact night-coverage at [email or web address], password [email or web address]

## 2016-09-30 NOTE — Progress Notes (Signed)
  Echocardiogram 2D Echocardiogram has been performed.  Duy Lemming T Sheniah Supak 09/30/2016, 1:51 PM

## 2016-09-30 NOTE — Progress Notes (Signed)
Physical Therapy Treatment Patient Details Name: Robert Buckley MRN: [id number] DOB: 1945-10-11 Today's Date: 09/30/2016    History of Present Illness Robert Buckley is a 71 y.o. male with medical history significant for HTN, OA, stroke CVA with residual right-sided weakness admitted with worsening of the right-sided weakness. Patient had a fall 2 days ago at home while he was trying to get up from chair. Ever since he has not been able to walk; MRI + for acute infarct    PT Comments    Assisted pt OOB to amb using B platform EVA walker.  Pt able to advance R LE at Supervision.   Pt would benefit from a more aggressive Rehab such as CIR vs SNF.  Follow Up Recommendations  CIR     Equipment Recommendations  None recommended by PT    Recommendations for Other Services       Precautions / Restrictions Precautions Precautions: Fall Precaution Comments: R hemiparesis Restrictions Weight Bearing Restrictions: No    Mobility  Bed Mobility Overal bed mobility: Needs Assistance   Rolling: Min assist   Supine to sit: Mod assist     General bed mobility comments: use of rail and increased assist with scooting to EOB  Transfers Overall transfer level: Needs assistance Equipment used: None Transfers: Sit to/from [employer] Sit to Stand: Min assist;+2 safety/equipment;+2 physical assistance Stand pivot transfers: +2 physical assistance;+2 safety/equipment;Min assist       General transfer comment: 25% VC's on proper hand placement and turn completion  Ambulation/Gait Ambulation/Gait assistance: Mod assist;Max assist;+2 physical assistance;+2 safety/equipment Ambulation Distance (Feet): 14 Feet Assistive device: Bilateral platform walker Gait Pattern/deviations: Step-to pattern;Decreased stance time - right Gait velocity: decreased   General Gait Details: used B platform EVA walker for increased support.  Pt required increased time to advance R LE and  assist to weight shift to Left.   Recliner following for safety.     Stairs            Wheelchair Mobility    Modified Rankin (Stroke Patients Only)       Balance                                            Cognition Arousal/Alertness: Awake/alert Behavior During Therapy: WFL for tasks assessed/performed Overall Cognitive Status: Within Functional Limits for tasks assessed                                        Exercises      General Comments        Pertinent Vitals/Pain Pain Assessment: No/denies pain    Home Living                      Prior Function            PT Goals (current goals can now be found in the care plan section) Progress towards PT goals: Progressing toward goals    Frequency    Min 4X/week      PT Plan      Co-evaluation              AM-PAC PT "6 Clicks" Daily Activity  Outcome Measure  Difficulty turning over in bed (including adjusting bedclothes, sheets and blankets)?: A Lot Difficulty  moving from lying on back to sitting on the side of the bed? : A Lot Difficulty sitting down on and standing up from a chair with arms (e.g., wheelchair, bedside commode, etc,.)?: A Lot Help needed moving to and from a bed to chair (including a wheelchair)?: A Lot Help needed walking in hospital room?: A Lot Help needed climbing 3-5 steps with a railing? : A Lot 6 Click Score: 12    End of Session Equipment Utilized During Treatment: Gait belt Activity Tolerance: Patient tolerated treatment well Patient left: with call bell/phone within reach;with bed alarm set;in chair Nurse Communication:  (RN assisted with amb) PT Visit Diagnosis: Hemiplegia and hemiparesis Hemiplegia - Right/Left: Right Hemiplegia - caused by: Cerebral infarction     Time: 0952-1018 PT Time Calculation (min) (ACUTE ONLY): 26 min  Charges:  $Gait Training: 8-22 mins $Therapeutic Activity: 8-22 mins                     G Codes:       {Kirrah Mustin  PTA [facility]  Acute  Rehab Pager      [phone]

## 2016-10-01 ENCOUNTER — Inpatient Hospital Stay (HOSPITAL_COMMUNITY)
Admission: RE | Admit: 2016-10-01 | Discharge: 2016-10-16 | DRG: 057 | Disposition: A | Discharge status: Skilled Nursing Facility | Payer: Medicare Other | Source: Intra-hospital | Attending: Physical Medicine & Rehabilitation | Admitting: Physical Medicine & Rehabilitation

## 2016-10-01 DIAGNOSIS — N390 Urinary tract infection, site not specified: Secondary | ICD-10-CM | POA: Diagnosis present

## 2016-10-01 DIAGNOSIS — G8191 Hemiplegia, unspecified affecting right dominant side: Secondary | ICD-10-CM

## 2016-10-01 DIAGNOSIS — E876 Hypokalemia: Secondary | ICD-10-CM | POA: Diagnosis not present

## 2016-10-01 DIAGNOSIS — I639 Cerebral infarction, unspecified: Secondary | ICD-10-CM

## 2016-10-01 DIAGNOSIS — I69351 Hemiplegia and hemiparesis following cerebral infarction affecting right dominant side: Principal | ICD-10-CM

## 2016-10-01 DIAGNOSIS — I10 Essential (primary) hypertension: Secondary | ICD-10-CM

## 2016-10-01 DIAGNOSIS — R32 Unspecified urinary incontinence: Secondary | ICD-10-CM

## 2016-10-01 DIAGNOSIS — E785 Hyperlipidemia, unspecified: Secondary | ICD-10-CM | POA: Diagnosis not present

## 2016-10-01 DIAGNOSIS — N401 Enlarged prostate with lower urinary tract symptoms: Secondary | ICD-10-CM | POA: Diagnosis present

## 2016-10-01 DIAGNOSIS — R350 Frequency of micturition: Secondary | ICD-10-CM

## 2016-10-01 DIAGNOSIS — Z79899 Other long term (current) drug therapy: Secondary | ICD-10-CM | POA: Diagnosis not present

## 2016-10-01 DIAGNOSIS — I129 Hypertensive chronic kidney disease with stage 1 through stage 4 chronic kidney disease, or unspecified chronic kidney disease: Secondary | ICD-10-CM | POA: Diagnosis present

## 2016-10-01 DIAGNOSIS — Z7902 Long term (current) use of antithrombotics/antiplatelets: Secondary | ICD-10-CM | POA: Diagnosis not present

## 2016-10-01 DIAGNOSIS — Z881 Allergy status to other antibiotic agents status: Secondary | ICD-10-CM

## 2016-10-01 DIAGNOSIS — N183 Chronic kidney disease, stage 3 unspecified: Secondary | ICD-10-CM

## 2016-10-01 DIAGNOSIS — G8111 Spastic hemiplegia affecting right dominant side: Secondary | ICD-10-CM | POA: Diagnosis not present

## 2016-10-01 DIAGNOSIS — B9620 Unspecified Escherichia coli [E. coli] as the cause of diseases classified elsewhere: Secondary | ICD-10-CM | POA: Diagnosis present

## 2016-10-01 LAB — BASIC METABOLIC PANEL
Anion gap: 8 (ref 5–15)
BUN: 18 mg/dL (ref 6–20)
CALCIUM: 8.5 mg/dL — AB (ref 8.9–10.3)
CO2: 21 mmol/L — AB (ref 22–32)
CREATININE: 1.48 mg/dL — AB (ref 0.61–1.24)
Chloride: 114 mmol/L — ABNORMAL HIGH (ref 101–111)
GFR calc non Af Amer: 46 mL/min — ABNORMAL LOW (ref >60)
GFR, EST AFRICAN AMERICAN: 54 mL/min — AB (ref >60)
GLUCOSE: 100 mg/dL — AB (ref 65–99)
Potassium: 3.4 mmol/L — ABNORMAL LOW (ref 3.5–5.1)
Sodium: 143 mmol/L (ref 135–145)

## 2016-10-01 LAB — CBC
HCT: 41.6 % (ref 39.0–52.0)
HEMATOCRIT: 42.5 % (ref 39.0–52.0)
Hemoglobin: 15 g/dL (ref 13.0–17.0)
Hemoglobin: 14.9 g/dL (ref 13.0–17.0)
MCH: 31.2 pg (ref 26.0–34.0)
MCH: 31.3 pg (ref 26.0–34.0)
MCHC: 36.1 g/dL — AB (ref 30.0–36.0)
MCHC: 35.1 g/dL (ref 30.0–36.0)
MCV: 86.5 fL (ref 78.0–100.0)
MCV: 89.3 fL (ref 78.0–100.0)
PLATELETS: 152 10*3/uL (ref 150–400)
Platelets: 157 10*3/uL (ref 150–400)
RBC: 4.81 MIL/uL (ref 4.22–5.81)
RBC: 4.76 MIL/uL (ref 4.22–5.81)
RDW: 13.4 % (ref 11.5–15.5)
RDW: 13.8 % (ref 11.5–15.5)
WBC: 5.3 10*3/uL (ref 4.0–10.5)
WBC: 5.9 10*3/uL (ref 4.0–10.5)

## 2016-10-01 LAB — CULTURE, BLOOD (ROUTINE X 2)

## 2016-10-01 LAB — CREATININE, SERUM
Creatinine, Ser: 1.76 mg/dL — ABNORMAL HIGH (ref 0.61–1.24)
GFR calc Af Amer: 43 mL/min — ABNORMAL LOW (ref >60)
GFR calc non Af Amer: 37 mL/min — ABNORMAL LOW (ref >60)

## 2016-10-01 LAB — HEMOGLOBIN A1C
HEMOGLOBIN A1C: 5.6 % (ref 4.8–5.6)
Mean Plasma Glucose: 114 mg/dL

## 2016-10-01 MED ORDER — ENOXAPARIN SODIUM 40 MG/0.4ML ~~LOC~~ SOLN: 40.0000 mg | SUBCUTANEOUS | Status: DC

## 2016-10-01 MED ORDER — CEPHALEXIN 500 MG PO CAPS: 500.0000 mg | ORAL_CAPSULE | Freq: Two times a day (BID) | ORAL | 0 refills | Status: DC

## 2016-10-01 MED ORDER — ATORVASTATIN CALCIUM 40 MG PO TABS: 40.0000 mg | ORAL_TABLET | Freq: Every day | ORAL | 0 refills | Status: DC

## 2016-10-01 MED ORDER — ONDANSETRON HCL 4 MG PO TABS: 4.0000 mg | ORAL_TABLET | Freq: Four times a day (QID) | ORAL | Status: DC | PRN

## 2016-10-01 MED ORDER — FINASTERIDE 5 MG PO TABS
5.0000 mg | ORAL_TABLET | Freq: Every day | ORAL | Status: DC
  Administered 2016-10-02 – 2016-10-16 (×15): 5 mg via ORAL
  Filled 2016-10-01 (×15): qty 1

## 2016-10-01 MED ORDER — ENOXAPARIN SODIUM 40 MG/0.4ML ~~LOC~~ SOLN
40.0000 mg | SUBCUTANEOUS | Status: DC
  Administered 2016-10-01 – 2016-10-15 (×15): 40 mg via SUBCUTANEOUS
  Filled 2016-10-01 (×15): qty 0.4

## 2016-10-01 MED ORDER — CLOPIDOGREL BISULFATE 75 MG PO TABS
75.0000 mg | ORAL_TABLET | Freq: Every day | ORAL | Status: DC
  Administered 2016-10-02 – 2016-10-16 (×15): 75 mg via ORAL
  Filled 2016-10-01 (×15): qty 1

## 2016-10-01 MED ORDER — ONDANSETRON HCL 4 MG/2ML IJ SOLN: 4.0000 mg | Freq: Four times a day (QID) | INTRAMUSCULAR | Status: DC | PRN

## 2016-10-01 MED ORDER — ATORVASTATIN CALCIUM 40 MG PO TABS
40.0000 mg | ORAL_TABLET | Freq: Every day | ORAL | Status: DC
Start: 2016-10-01 — End: 2016-10-16
  Administered 2016-10-01 – 2016-10-15 (×15): 40 mg via ORAL
  Filled 2016-10-01 (×15): qty 1

## 2016-10-01 MED ORDER — CEPHALEXIN 500 MG PO CAPS
500.0000 mg | ORAL_CAPSULE | Freq: Two times a day (BID) | ORAL | Status: DC
  Administered 2016-10-01: 500 mg via ORAL
  Filled 2016-10-01: qty 1

## 2016-10-01 MED ORDER — CEPHALEXIN 250 MG PO CAPS
500.0000 mg | ORAL_CAPSULE | Freq: Two times a day (BID) | ORAL | Status: AC
  Administered 2016-10-01 – 2016-10-08 (×14): 500 mg via ORAL
  Filled 2016-10-01 (×15): qty 2

## 2016-10-01 MED ORDER — SORBITOL 70 % SOLN: 30.0000 mL | Freq: Every day | Status: DC | PRN

## 2016-10-01 NOTE — Progress Notes (Signed)
Physical Therapy Treatment Patient Details Name: Robert Buckley MRN: [id number] DOB: [birth year] Today's Date: 10/01/2016    History of Present Illness Robert Buckley is a 71 y.o. male with medical history significant for HTN, OA, stroke CVA with residual right-sided weakness admitted with worsening of the right-sided weakness. Patient had a fall 2 days ago at home while he was trying to get up from chair. Ever since he has not been able to walk; MRI + for acute infarct    PT Comments    Assisted OOB to amb twice in hallway.  Required rest break between due to 3/4 dyspnea.    Follow Up Recommendations  CIR     Equipment Recommendations  None recommended by PT    Recommendations for Other Services       Precautions / Restrictions Precautions Precautions: Fall Precaution Comments: R hemiparesis Restrictions Weight Bearing Restrictions: No    Mobility  Bed Mobility Overal bed mobility: Needs Assistance Bed Mobility: Supine to Sit     Supine to sit: Mod assist     General bed mobility comments: Mod Assist for upper body and increased time to complete scooting to EOB due to R UE hemiparesis  Transfers Overall transfer level: Needs assistance Equipment used: None Transfers: Sit to/from [employer] Sit to Stand: Min assist;Mod assist Stand pivot transfers: Min assist;Mod assist       General transfer comment: 25% VC's on proper hand placement and increaserd assist with stand to sit to control  Ambulation/Gait Ambulation/Gait assistance: Mod assist;Max assist;+2 physical assistance;+2 safety/equipment Ambulation Distance (Feet): 18 Feet Assistive device: Bilateral platform walker Gait Pattern/deviations: Step-to pattern;Decreased stance time - right Gait velocity: decreased   General Gait Details: used B platform EVA walker for increased support.  Pt required increased time to advance R LE and assist to weight shift to Left.   Recliner following for  safety.     Stairs            Wheelchair Mobility    Modified Rankin (Stroke Patients Only)       Balance                                            Cognition Arousal/Alertness: Awake/alert Behavior During Therapy: WFL for tasks assessed/performed Overall Cognitive Status: Within Functional Limits for tasks assessed                                        Exercises      General Comments        Pertinent Vitals/Pain Pain Assessment: No/denies pain    Home Living                      Prior Function            PT Goals (current goals can now be found in the care plan section) Progress towards PT goals: Progressing toward goals    Frequency    Min 4X/week      PT Plan Current plan remains appropriate    Co-evaluation              AM-PAC PT "6 Clicks" Daily Activity  Outcome Measure  Difficulty turning over in bed (including adjusting bedclothes, sheets and blankets)?: A Lot Difficulty moving from  lying on back to sitting on the side of the bed? : A Lot Difficulty sitting down on and standing up from a chair with arms (e.g., wheelchair, bedside commode, etc,.)?: A Lot Help needed moving to and from a bed to chair (including a wheelchair)?: A Lot Help needed walking in hospital room?: A Lot Help needed climbing 3-5 steps with a railing? : A Lot 6 Click Score: 12    End of Session Equipment Utilized During Treatment: Gait belt Activity Tolerance: Patient tolerated treatment well Patient left: with call bell/phone within reach;with bed alarm set;in chair   PT Visit Diagnosis: Hemiplegia and hemiparesis Hemiplegia - Right/Left: Right Hemiplegia - caused by: Cerebral infarction     Time: 1610-9604 PT Time Calculation (min) (ACUTE ONLY): 28 min  Charges:  $Gait Training: 8-22 mins $Therapeutic Activity: 8-22 mins                    G Codes:       Felecia Shelling  PTA [facility]  Acute  Rehab Pager       [phone]

## 2016-10-01 NOTE — Discharge Instructions (Signed)
Inpatient Rehab Discharge Instructions  Robert Buckley Discharge date and time: No discharge date for patient encounter.   Activities/Precautions/ Functional Status: Activity: activity as tolerated Diet: regular diet Wound Care: none needed Functional status:  ___ No restrictions     ___ Walk up steps independently ___ 24/7 supervision/assistance   ___ Walk up steps with assistance ___ Intermittent supervision/assistance  ___ Bathe/dress independently ___ Walk with walker     _x__ Bathe/dress with assistance ___ Walk Independently    ___ Shower independently ___ Walk with assistance    ___ Shower with assistance ___ No alcohol     ___ Return to work/school ________  Special Instructions:  No driving STROKE/TIA DISCHARGE INSTRUCTIONS SMOKING Cigarette smoking nearly doubles your risk of having a stroke & is the single most alterable risk factor  If you smoke or have smoked in the last 12 months, you are advised to quit smoking for your health.  Most of the excess cardiovascular risk related to smoking disappears within a year of stopping.  Ask you doctor about anti-smoking medications  [place] Quit Line: 1-800-QUIT NOW  Free Smoking Cessation Classes [phone]  CHOLESTEROL Know your levels; limit fat & cholesterol in your diet  Lipid Panel     Component Value Date/Time   CHOL 114 09/30/2016 0459   TRIG 142 09/30/2016 0459   HDL 41 09/30/2016 0459   CHOLHDL 2.8 09/30/2016 0459   VLDL 28 09/30/2016 0459   LDLCALC 45 09/30/2016 0459      Many patients benefit from treatment even if their cholesterol is at goal.  Goal: Total Cholesterol (CHOL) less than 160  Goal:  Triglycerides (TRIG) less than 150  Goal:  HDL greater than 40  Goal:  LDL (LDLCALC) less than 100   BLOOD PRESSURE American Stroke Association blood pressure target is less that 120/80 mm/Hg  Your discharge blood pressure is:  BP: (!) 127/95  Monitor your blood pressure  Limit your salt and alcohol  intake  Many individuals will require more than one medication for high blood pressure  DIABETES (A1c is a blood sugar average for last 3 months) Goal HGBA1c is under 7% (HBGA1c is blood sugar average for last 3 months)  Diabetes: No known diagnosis of diabetes    Lab Results  Component Value Date   HGBA1C 5.6 09/30/2016     Your HGBA1c can be lowered with medications, healthy diet, and exercise.  Check your blood sugar as directed by your physician  Call your physician if you experience unexplained or low blood sugars.  PHYSICAL ACTIVITY/REHABILITATION Goal is 30 minutes at least 4 days per week  Activity: Increase activity slowly, Therapies: Physical Therapy: Home Health Return to work:   Activity decreases your risk of heart attack and stroke and makes your heart stronger.  It helps control your weight and blood pressure; helps you relax and can improve your mood.  Participate in a regular exercise program.  Talk with your doctor about the best form of exercise for you (dancing, walking, swimming, cycling).  DIET/WEIGHT Goal is to maintain a healthy weight  Your discharge diet is: Diet Heart Room service appropriate? Yes; Fluid consistency: Thin  liquids Your height is:  Height: 5\' 7"  (170.2 cm) Your current weight is: Weight: 89.4 kg (197 lb 3.2 oz) Your Body Mass Index (BMI) is:  BMI (Calculated): 30.88  Following the type of diet specifically designed for you will help prevent another stroke.  Your goal weight range is:    Your goal  Body Mass Index (BMI) is 19-24.  Healthy food habits can help reduce 3 risk factors for stroke:  High cholesterol, hypertension, and excess weight.  RESOURCES Stroke/Support Group:  Call [phone]   STROKE EDUCATION PROVIDED/REVIEWED AND GIVEN TO PATIENT Stroke warning signs and symptoms How to activate emergency medical system (call 911). Medications prescribed at discharge. Need for follow-up after discharge. Personal risk factors  for stroke. Pneumonia vaccine given:  Flu vaccine given:  My questions have been answered, the writing is legible, and I understand these instructions.  I will adhere to these goals & educational materials that have been provided to me after my discharge from the hospital.     My questions have been answered and I understand these instructions. I will adhere to these goals and the provided educational materials after my discharge from the hospital.  Patient/Caregiver Signature _______________________________ Date __________  Clinician Signature _______________________________________ Date __________  Please bring this form and your medication list with you to all your follow-up doctor's appointments.

## 2016-10-01 NOTE — Progress Notes (Signed)
Patient arrived from [place] via [place], appears alert and oriented.

## 2016-10-01 NOTE — Progress Notes (Signed)
Subjective: No change in right sided weakness  Exam: Vitals:   09/30/16 2020 10/01/16 0605  BP: 137/88 (!) 153/92  Pulse: 80 66  Resp: 18 16  Temp: 97.9 F (36.6 C) 97.9 F (36.6 C)  SpO2: 98% 96%    HEENT-  Normocephalic, no lesions, without obvious abnormality.  Normal external eye and conjunctiva.  Normal TM's bilaterally.  Normal auditory canals and external ears. Normal external nose, mucus membranes and septum.  Normal pharynx.   Neuro:  CN: Pupils are equal and round. They are symmetrically reactive from 3-->2 mm. EOMI without nystagmus. Facial sensation is intact to light touch. Face is symmetric at rest with normal strength and mobility. Hearing is intact to conversational voice. Palate elevates symmetrically and uvula is midline. Voice is normal in tone, pitch and quality. Bilateral SCM and trapezii are 5/5. Tongue is midline with normal bulk and mobility.  Motor: right leg>arm wekness  Sensation: decreased on face, arm and leg.  DTRs: 3+, symmetric  Toes downgoing bilaterally. No pathologic reflexes.      Pertinent Labs/Diagnostics: MRA/MRI:  IMPRESSION: Acute 1 cm infarction in the left hemisphere radiating white matter tracts/posterior corona radiata. No hemorrhage or mass effect.  Extensive chronic small vessel ischemic changes elsewhere throughout the brain.  No LVO  Echo: - Left ventricle: The cavity size was normal. Wall thickness was   increased in a pattern of mild LVH. Systolic function was normal.   The estimated ejection fraction was in the range of 55% to 60%.   Wall motion was normal; there were no regional wall motion   abnormalities. Doppler parameters are consistent with abnormal   left ventricular relaxation (grade 1 diastolic dysfunction).  Impressions:  - Normal LV systolic function; mild diastolic dysfunction; mild   LVH.    Carotid doppler: Findings suggest 1-39% internal carotid artery stenosis bilaterally. The left  vertebral artery is patent with antegrade flow. Unable to visualize the right vertebral artery.  LABS: LDL 45 A1C  5.6  Robert Morn PA-C [facility] [phone]  Impression: acute punctate stroke. Stroke work up finished.    Recommendations: Continue Lipitor and Plavix Follow up with neuro out patient.  Plan is for him to go to CIR  Neuro S/O--no need to [facility] to [facility] for stroke at this time.     10/01/2016, 8:53 AM

## 2016-10-01 NOTE — Progress Notes (Signed)
Meredith Staggers, MD Physician Signed Physical Medicine and Rehabilitation  PMR Pre-admission Date of Service: 10/01/2016 10:28 AM  Related encounter: ED to Hosp-Admission (Discharged) from 09/28/2016 in [place]       '[]' Hide copied text   [employer] PMR Admission Coordinator Pre-Admission Assessment  Patient: Robert Buckley is an 71 y.o., male MRN: [id number] DOB: 21-Sep-1945 Height: '5\' 7"'  (170.2 cm) Weight: 88.4 kg (194 lb 14.2 oz)  Insurance Information HMO:     PPO:      PCP:      IPA:      80/20: yes     OTHER: no HMO PRIMARY: Medicare a and b      Policy#: [id number]      Subscriber: pt Benefits:  Phone #: passport one online     Name: 10/01/2016 Eff. Date: 01/12/2011     Deduct: $1340      Out of Pocket Max: none      Life Max: none CIR: 100%      SNF: 20 full days Outpatient: 80%     Co-Pay: 20% Home Health: 100%      Co-Pay: none DME: 80%     Co-Pay: 20% Providers: pt choice  SECONDARY: [employer]      Policy#: [id number]      Subscriber: pt  Medicaid Application Date:       Case Manager:  Disability Application Date:       Case Worker:    Emergency Contact Information        Contact Information    Name Relation Home Work Mobile   [place] Son   [phone]      Current Medical History  Patient Admitting Diagnosis: left CVA  History of Present Illness: [id number] Robert Buckley is a 71 year old right-handed male with 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  Presented 09/28/2016 after a recent fall 09/26/2016 without loss of consciousness while trying to get up from his chair as well as reported increased right sided weakness. Cranial CT scan with no acute changes. Lumbar spine films negative. MRI showed acute 1 cm infarction in the left hemisphere radiating white at her tracts posterior corona radiata. No hemorrhage or mass effect. Patient  did not receive TPA.Carotid Dopplers with no ICA stenosis. Echocardiogram with ejection fraction of 64% grade 1 diastolic dysfunction.Marland KitchenMRA with no large vessel occlusion aneurysm or significant stenosis.Currently continues on Plavix for CVA prophylaxis. Subcutaneous Lovenox for DVT prophylaxis. Creatinine 1.66 on admission and no prior labs forcomparisonand latest creatinine 1.48. Tolerating a regular consistency diet. Urine culture greater than 100,000 Escherichia coli currently maintained on Rocephin. Blood culture showing no growth.   Patient's medical record from [facility] has been reviewed by the rehabilitation admission coordinator and physician.  NIH Stroke scale: 6 Glascow Coma Scale:  Past Medical History      Past Medical History:  Diagnosis Date  . Arthritis   . Hypertension   . Stroke [facility]) 2008   residual R sided weakness    Family History   family history is not on file.  Prior Rehab/Hospitalizations Has the patient had major surgery during 100 days prior to admission? No               Current Medications SEE MAR   Patients Current Diet:  Regular diet with thin liquids  Precautions / Restrictions Precautions Precautions: Fall Precaution Comments: R hemiparesis Restrictions Weight Bearing Restrictions: No  Has the patient had 2 or more falls or a fall with injury in the past year?No fall once 2 days pta  Prior Activity Level Limited Community (1-2x/wk): Pt mod I RW pta, does own adls, does not drive since 3007. Uses seated RW, needs assist into shower but able to bathe and dress himself.  Prior Functional Level Self Care: Did the patient need help bathing, dressing, using the toilet or eating?  Independent  Indoor Mobility: Did the patient need assistance with walking from room to room (with or without device)? Independent  Stairs: Did the patient need assistance with internal or external stairs (with or without device)?  Independent  Functional Cognition: Did the patient need help planning regular tasks such as shopping or remembering to take medications? Needed some help  Home Assistive Devices / Equipment Home Assistive Devices/Equipment:  (RW with seat) Home Equipment: Shower seat, Grab bars - tub/shower, Hand held shower head, Walker - 4 wheels  Prior Device Use: Indicate devices/aids used by the patient prior to current illness, exacerbation or injury? Walker   Prior Functional Level Current Functional Level  Bed Mobility  Mod Independent  Min assist   Transfers  Mod Independent  Min assist, Mod assist   Mobility - Walk/Wheelchair  Mod Independent  Mod assist (mod to max assist  EVA walker 14 feet. Increased time to adv)ance RLE   Upper Body Dressing  Mod Independent  Min assist   Lower Body Dressing  Mod Independent  Max assist   Grooming  Mod Independent  Min assist   Eating/Drinking  Independent  Min assist   Toilet Transfer  Mod Independent  Min assist   Bladder Continence   continent  incontinent; condom catheter   Bowel Management  continent  continent LBM 09/28/16   Stair Climbing  Not needed   (not attempted)   Communication  intact  intact   Memory  intact  intact   Cooking/Meal Prep  pt would warm up leftovers for lunch or sandwich      Housework  dependent    Money Management  dependent    Driving   not since 6226 after CVA      Special needs/care consideration BiPAP/CPAP  N/a CPM n/a Continuous Drip IV  N/a Dialysis  N/a Life Vest  N/a Oxygen  N/a Special Bed  N/a Trach Size  N/a Wound Vac n/a Skin  intact Bowel mgmt: continent LBM 09/28/16 Bladder mgmt: incontinent ; condom catheter Diabetic mgmt n/a  Previous Home Environment Living Arrangements: Children (pt lives with his 2 sons, Cecilie Lowers and Vicente Males)  Lives With: Family, Son Available Help at Discharge:  Vicente Males works  days at [employer]; Cecilie Lowers currently unemployed) Type of Home: Apartment Home Layout: One level Home Access: Level entry Bathroom Shower/Tub: Multimedia programmer: Handicapped height Bathroom Accessibility: Yes How Accessible: Accessible via [place]: No Additional Comments: lives in handicapped accessible apt with his 2 sons  Discharge Living Setting Plans for Discharge Living Setting: Apartment, Lives with (comment) (lives with 2 sons, Cecilie Lowers and Vicente Males) Type of Home at Discharge: Apartment Discharge Home Layout: One level Discharge Home Access: Level entry (handicapped accessible apartment) Discharge Bathroom Shower/Tub: Walk-in shower Discharge Bathroom Toilet: Handicapped height Discharge Bathroom Accessibility: Yes How Accessible: Accessible via walker Does the patient have any problems obtaining your medications?: No  Social/Family/Support Systems 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 Information: Cecilie Lowers, son Anticipated Caregiver: sons Anticipated Caregiver's Contact Information: see above  Ability/Limitations of Caregiver: [place] currently unemployed Caregiver Availability: 24/7 Discharge Plan Discussed with Primary Caregiver: Yes Is Caregiver In Agreement with Plan?: Yes Does Caregiver/Family have Issues with Lodging/Transportation while Pt is in Rehab?: No  Goals/Additional Needs Patient/Family Goal for Rehab: Mod I to superivision with PT, mod I to supervision with OT and SLP Expected length of stay: ELOS 10- 14 days Equipment Needs: fall prevention bundle Pt/Family Agrees to Admission and willing to participate: Yes Program Orientation Provided & Reviewed with Pt/Caregiver Including Roles  & Responsibilities: Yes  Barriers to Discharge: Incontinence  Patient Condition: I have reviewed medical records and met with pt at bedside for assessment. Patient will benefit from ongoing PT, OT, and SLP offered with  an inpatient acute rehabilitation admission. He will receive 3 hours per day of therapy as well benefit from the coordinated Team approach offered though therapy, Rehabilatative Physician and Nursing care. He is currently overall mod to max assist with mobility and ADLs. We will admit to Acute inpatient Rehabilatation today.  Preadmission Screen Completed By:  Cleatrice Burke, 10/01/2016 10:28 AM ______________________________________________________________________   Discussed status with Dr. Naaman Plummer  on  10/01/2016  at  1039 and received telephone approval for admission today.  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 Does the need for close, 24 hr/day  Medical supervision in concert with the patient's rehab needs make it unreasonable for this patient to be served in a less intensive setting? Yes 2. Co-Morbidities requiring supervision/potential complications: post-stroke sequelae, htn, renal insufficiency, prior cva 3. Due to bladder management, bowel management, safety, skin/wound care, disease management, medication administration, pain management and patient education, does the patient require 24 hr/day rehab nursing? Yes 4. Does the patient require coordinated care of a physician, rehab nurse, PT (1-2 hrs/day, 5 days/week), OT (1-2 hrs/day, 5 days/week) and SLP (1-2 hrs/day, 5 days/week) to address physical and functional deficits in the context of the above medical diagnosis(es)? Yes Addressing deficits in the following areas: balance, endurance, locomotion, strength, transferring, bowel/bladder control, bathing, dressing, feeding, grooming, toileting, cognition, speech and psychosocial support 5. Can the patient actively participate in an intensive therapy program of at least 3 hrs of therapy 5 days a week? Yes 6. The potential for patient to make measurable gains  while on inpatient rehab is excellent 7. Anticipated functional outcomes upon discharge from inpatients are: modified independent and supervision PT, modified independent and supervision OT, modified independent and supervision SLP 8. Estimated rehab length of stay to reach the above functional goals is: 10-14 days 9. Does the patient have adequate social supports to accommodate these discharge functional goals? Yes 10. Anticipated D/C setting: Home 11. Anticipated post D/C treatments: HH therapy and Outpatient therapy 12. Overall Rehab/Functional Prognosis: excellent    RECOMMENDATIONS: This patient's condition is appropriate for continued rehabilitative care in the following setting: CIR Patient has agreed to participate in recommended program. Yes Note that insurance prior authorization may be required for reimbursement for recommended care.  Comment:  Meredith Staggers, MD, [facility] 10/01/2016   Cleatrice Burke 10/01/2016    Revision History

## 2016-10-01 NOTE — Progress Notes (Signed)
Report given to ED on [facility], and called report to [facility]. Pt updated on anticipated tranfer to inpt Rehab. Son Tasia Catchings updated as well. Instructions given to son and patient. SRP, RN

## 2016-10-01 NOTE — Progress Notes (Signed)
Patient information reviewed and entered into eRehab system by Harlyn Italiano, RN, CRRN, PPS Coordinator.  Information including medical coding and functional independence measure will be reviewed and updated through discharge.     Per nursing patient was given "Data Collection Information Summary for Patients in Inpatient Rehabilitation Facilities with attached "Privacy Act Statement-Health Care Records" upon admission.  

## 2016-10-01 NOTE — Discharge Summary (Signed)
[facility] Discharge Summary   Patient: Robert Buckley ZOX:[id number]   PCP: Patient, No Pcp Per DOB: [birth year]   Date of admission: 09/28/2016   Date of discharge: 10/01/2016    Discharge Diagnoses:  Principal Problem:   UTI (urinary tract infection) Active Problems:   Hypertension   Acute CVA (cerebrovascular accident) (HCC)   Dyslipidemia   Hypomagnesemia   AKI (acute kidney injury) (HCC)   Hypokalemia   Fall at home, initial encounter   Ataxia   Admitted From: home Disposition:  CIR  Recommendations for Outpatient Follow-up:  1. Please follow up with PCP in week and neurology in 6 weeks   Follow-up Information    PCP. Schedule an appointment as soon as possible for a visit in 2 week(s).        [facility]. Schedule an appointment as soon as possible for a visit in 6 week(s).   Specialty:  Neurology Contact information: [address] [place] [address] [phone]         Diet recommendation: cardiac diet  Activity: The patient is advised to gradually reintroduce usual activities.  Discharge Condition: good  Code Status: full code  History of present illness: As per the H and P dictated on admission, "Robert Buckley is a 71 y.o. male with medical history significant for stroke with residual right-sided weakness admitted with worsening of the right-sided weakness. Patient had a fall 2 days ago at home while he was trying to get up from chair. Ever since he has not been able to walk. He denies any pain. He denies hitting his head. He denies loss of consciousness. He denies hitting his head. At baseline he was able to walk with a walker inside the house. He reports that he cannot do that anymore. He denies any fever chills nausea vomiting or diarrhea. He denies any headache or changes with his vision. He denied any dysuria."  Hospital Course:  Summary of his active problems in the hospital is as following. 1.  Fall. Acute CVA lacunar infarct. Patient presented with a fall happened 2 days ago Before admission. CT scan unremarkable. MRI brain shows an acute infarct in the left hemisphere near posterior corona radiata. Discussed with neurology recommended to continue Plavix. Also recommend MRA head as well as further stroke workup. Normal hemoglobin A1c, lipid profile,  Echocardiogram with EF of 55-60%, diastolic dysfunction and no WMA No significant stenosis on carotid Doppler. PTOT and speech therapy consulted already. CIR recommended for now and arranged.   2. Escherichia coli UTI. Treated with IV ceftriaxone. Switch to oral keflex  3. Ventriculomegaly. No evidence of ataxia or urinary incontinence or worsening mentation over last few months. No evidence of normal pressure hydrocephalus. Likely chronic atrophy.  4. Acute kidney injury. No prior lab work available to compare. Serum creatinine elevated currently trending downwards. Repeat with PCP in 1 week  5.Hypokalemia. Replaced  All other chronic medical condition were stable during the hospitalization.  Patient was seen by physical therapy, who recommended CIR which was arranged by Child psychotherapist and case Production designer, theatre/television/film. On the day of the discharge the patient's vitals were stable, and no other acute medical condition were reported by patient. the patient was felt safe to be discharge at [facility] with therapy.  Procedures and Results:  Echocardiogram   Carotid dopplers  Consultations:  Neurology,  IP rehab  DISCHARGE MEDICATION: Discharge Medication List as of 10/01/2016 12:06 PM    START taking these medications   Details  atorvastatin (LIPITOR) 40 MG tablet Take 1 tablet (40 mg total) by mouth daily at 6 PM., Starting Tue 10/01/2016, Normal    cephALEXin (KEFLEX) 500 MG capsule Take 1 capsule (500 mg total) by mouth every 12 (twelve) hours., Starting Tue 10/01/2016, Until Fri 10/04/2016, Normal      CONTINUE these medications  which have NOT CHANGED   Details  clopidogrel (PLAVIX) 75 MG tablet Take 75 mg by mouth daily., Historical Med    finasteride (PROSCAR) 5 MG tablet Take 5 mg by mouth daily., Historical Med    metoprolol succinate (TOPROL-XL) 50 MG 24 hr tablet Take 50 mg by mouth daily. Take with or immediately following a meal., Historical Med      STOP taking these medications     amLODipine (NORVASC) 5 MG tablet      ramipril (ALTACE) 10 MG capsule      simvastatin (ZOCOR) 20 MG tablet        Allergies  Allergen Reactions  . Sulfa Antibiotics Rash   Discharge Instructions    Ambulatory referral to Neurology    Complete by:  As directed    An appointment is requested in approximately: 4 weeks   Diet - low sodium heart healthy    Complete by:  As directed    Discharge instructions    Complete by:  As directed    It is important that you read following instructions as well as go over your medication list with RN to help you understand your care after this hospitalization.  Discharge Instructions: Please follow-up with PCP in one week  Please request your primary care physician to go over all Hospital Tests and Procedure/Radiological results at the follow up,  Please get all Hospital records sent to your PCP by signing hospital release before you go home.   Do not drive, operating heavy machinery, perform activities at heights, swimming or participation in water activities or provide baby sitting services; until you have been seen by Primary Care Physician or a Neurologist and advised to do so again. Do not take more than prescribed Pain, Sleep and Anxiety Medications. You were cared for by a hospitalist during your hospital stay. If you have any questions about your discharge medications or the care you received while you were in the hospital after you are discharged, you can call the unit and ask to speak with the hospitalist on call if the hospitalist that took care of you is not  available.  Once you are discharged, your primary care physician will handle any further medical issues. Please note that NO REFILLS for any discharge medications will be authorized once you are discharged, as it is imperative that you return to your primary care physician (or establish a relationship with a primary care physician if you do not have one) for your aftercare needs so that they can reassess your need for medications and monitor your lab values. You Must read complete instructions/literature along with all the possible adverse reactions/side effects for all the Medicines you take and that have been prescribed to you. Take any new Medicines after you have completely understood and accept all the possible adverse reactions/side effects. Wear Seat belts while driving. If you have smoked or chewed Tobacco in the last 2 yrs please stop smoking and/or stop any Recreational drug use.   Increase activity slowly    Complete by:  As directed      Discharge Exam: Filed Weights   09/28/16 1119 09/28/16 1650  Weight: 92.1  kg (203 lb) 88.4 kg (194 lb 14.2 oz)   Vitals:   09/30/16 2020 10/01/16 0605  BP: 137/88 (!) 153/92  Pulse: 80 66  Resp: 18 16  Temp: 97.9 F (36.6 C) 97.9 F (36.6 C)  SpO2: 98% 96%   General: Appear in no distress, no Rash; Oral Mucosa moist. Cardiovascular: S1 and S2 Present, no Murmur, no JVD Respiratory: Bilateral Air entry present and Clear to Auscultation, no Crackles, no wheezes Abdomen: Bowel Sound present, Soft and no tenderness Extremities: no Pedal edema, no calf tenderness Neurology: chronic right weakness and dysarthria  The results of significant diagnostics from this hospitalization (including imaging, microbiology, ancillary and laboratory) are listed below for reference.    Significant Diagnostic Studies: Dg Lumbar Spine Complete  Result Date: 09/28/2016 CLINICAL DATA:  Pt had a fall 2 days ago at home after trying to get up from couch to go to  the bathroom. Hx of stroke in 2008, with residual right sided weakness. States since his fall, he has not been able to feel his right leg and has no muscle control of it. EXAM: LUMBAR SPINE - COMPLETE 4+ VIEW COMPARISON:  None. FINDINGS: No fracture. Slight anterolisthesis of L4 on L5, degenerative in origin. No other spondylolisthesis. Moderate loss of disc height at L4-L5 and L5-S1. Endplate osteophytes noted most prominently at these levels. There is facet degenerative change bilaterally at L4-L5-L5-S1. Bones are demineralized. Soft tissues are unremarkable. IMPRESSION: 1. No fracture or acute finding. 2. Degenerative changes as detailed. Electronically Signed   By: Amie Portland M.D.   On: 09/28/2016 12:27   Ct Head Wo Contrast  Result Date: 09/28/2016 CLINICAL DATA:  Fall 2 days ago. Fall yesterday as well. Dizziness. Stroke in 2008 right-sided weakness. EXAM: CT HEAD WITHOUT CONTRAST TECHNIQUE: Contiguous axial images were obtained from the base of the skull through the vertex without intravenous contrast. COMPARISON:  None. FINDINGS: Brain: No subdural, epidural, or subarachnoid hemorrhage. Cerebellum, brainstem, and basal cisterns are normal. Ventricles and sulci are prominent. White matter changes are identified. No acute cortical ischemia or infarct. No mass, mass effect, or midline shift. Vascular: Calcified atherosclerosis is seen in the intracranial carotid arteries. Skull: Normal. Negative for fracture or focal lesion. Sinuses/Orbits: No acute finding. Other: None. IMPRESSION: 1. No acute abnormalities identified. Chronic white matter changes. Prominence of ventricles and sulci may represent volume loss. Given the history of falls, recommend correlation for symptoms of normal pressure hydrocephalus as well. Electronically Signed   By: Gerome Sam III M.D   On: 09/28/2016 12:15   Mr Maxine Glenn Head Wo Contrast  Result Date: 09/30/2016 CLINICAL DATA:  71 y/o  M; acute CVA lacunar infarct for  follow-up. EXAM: MRA HEAD WITHOUT CONTRAST TECHNIQUE: Angiographic images of the Circle of Willis were obtained using MRA technique without intravenous contrast. COMPARISON:  09/29/2016 MRI of the head. FINDINGS: Anterior circulation: Right distal cavernous ICA mild stenosis, likely related to atherosclerosis. Otherwise no large vessel occlusion, aneurysm, or significant stenosis is identified. Posterior circulation: No large vessel occlusion, aneurysm, or significant stenosis is identified. Left dominant vertebrobasilar system. Posterior circulation: No anterior posterior communicating artery identified, likely hypoplastic or absent. IMPRESSION: Patent circle of Willis. No large vessel occlusion, aneurysm, or significant stenosis is identified. Electronically Signed   By: Mitzi Hansen M.D.   On: 09/30/2016 22:38   Mr Brain Wo Contrast  Result Date: 09/29/2016 CLINICAL DATA:  Acute presentation with ataxia.  Assess for stroke. EXAM: MRI HEAD WITHOUT CONTRAST TECHNIQUE: Multiplanar, multiecho  pulse sequences of the brain and surrounding structures were obtained without intravenous contrast. COMPARISON:  CT 09/28/2016 FINDINGS: Brain: diffusion imaging shows a 1 cm acute infarction on the left in the radiating white matter tracts of the posterior corona radiata. No cortical or large vessel territory infarction. Elsewhere, there chronic small-vessel ischemic changes affecting the pons. There are old small vessel cerebellar infarctions. Cerebral hemispheres show old small vessel infarctions affecting the thalami, basal ganglia and hemispheric white matter. No mass lesion, hemorrhage, hydrocephalus or extra-axial collection. Vascular: Major vessels at the base of the brain show flow. Skull and upper cervical spine: Negative Sinuses/Orbits: Clear except for retention cyst in the right maxillary sinus and right division of the sphenoid sinus. Orbits negative. Other: None IMPRESSION: Acute 1 cm infarction in  the left hemisphere radiating white matter tracts/posterior corona radiata. No hemorrhage or mass effect. Extensive chronic small vessel ischemic changes elsewhere throughout the brain. Electronically Signed   By: Paulina Fusi M.D.   On: 09/29/2016 13:45    Microbiology: Recent Results (from the past 240 hour(s))  Urine culture     Status: Abnormal   Collection Time: 09/28/16 11:05 AM  Result Value Ref Range Status   Specimen Description URINE, RANDOM  Final   Special Requests NONE  Final   Culture >=100,000 COLONIES/mL ESCHERICHIA COLI (A)  Final   Report Status 09/30/2016 FINAL  Final   Organism ID, Bacteria ESCHERICHIA COLI (A)  Final      Susceptibility   Escherichia coli - MIC*    AMPICILLIN >=32 RESISTANT Resistant     CEFAZOLIN 8 SENSITIVE Sensitive     CEFTRIAXONE <=1 SENSITIVE Sensitive     CIPROFLOXACIN >=4 RESISTANT Resistant     GENTAMICIN <=1 SENSITIVE Sensitive     IMIPENEM <=0.25 SENSITIVE Sensitive     NITROFURANTOIN <=16 SENSITIVE Sensitive     TRIMETH/SULFA <=20 SENSITIVE Sensitive     AMPICILLIN/SULBACTAM 16 INTERMEDIATE Intermediate     PIP/TAZO <=4 SENSITIVE Sensitive     Extended ESBL NEGATIVE Sensitive     * >=100,000 COLONIES/mL ESCHERICHIA COLI  Blood culture (routine x 2)     Status: None (Preliminary result)   Collection Time: 09/28/16  2:07 PM  Result Value Ref Range Status   Specimen Description BLOOD LEFT HAND  Final   Special Requests   Final    BOTTLES DRAWN AEROBIC AND ANAEROBIC Blood Culture results may not be optimal due to an inadequate volume of blood received in culture bottles   Culture   Final    NO GROWTH 3 DAYS Performed at [facility], [address], [place], [place] [address]    Report Status PENDING  Incomplete  Blood culture (routine x 2)     Status: Abnormal   Collection Time: 09/28/16  2:27 PM  Result Value Ref Range Status   Specimen Description BLOOD LEFT ANTECUBITAL  Final   Special Requests   Final    BOTTLES DRAWN  AEROBIC AND ANAEROBIC Blood Culture results may not be optimal due to an inadequate volume of blood received in culture bottles   Culture  Setup Time   Final    GRAM POSITIVE COCCI IN CLUSTERS IN BOTH AEROBIC AND ANAEROBIC BOTTLES CRITICAL RESULT CALLED TO, READ BACK BY AND VERIFIED WITH: E WILLIAMSON,PHARMD AT 1347 09/29/16 BY L BENFIELD    Culture (A)  Final    STAPHYLOCOCCUS SPECIES (COAGULASE NEGATIVE) THE SIGNIFICANCE OF ISOLATING THIS ORGANISM FROM A SINGLE SET OF BLOOD CULTURES WHEN MULTIPLE SETS  ARE DRAWN IS UNCERTAIN. PLEASE NOTIFY THE MICROBIOLOGY DEPARTMENT WITHIN ONE WEEK IF SPECIATION AND SENSITIVITIES ARE REQUIRED. Performed at [facility], [address] [place], [place] [address]    Report Status 10/01/2016 FINAL  Final  Blood Culture ID Panel (Reflexed)     Status: Abnormal   Collection Time: 09/28/16  2:27 PM  Result Value Ref Range Status   Enterococcus species NOT DETECTED NOT DETECTED Final   Vancomycin resistance NOT DETECTED NOT DETECTED Final   Listeria monocytogenes NOT DETECTED NOT DETECTED Final   Staphylococcus species DETECTED (A) NOT DETECTED Final    Comment: CRITICAL RESULT CALLED TO, READ BACK BY AND VERIFIED WITH: E WILLIAMSON,PHARMD AT 1347 09/29/16 BY L BENFIELD    Staphylococcus aureus NOT DETECTED NOT DETECTED Final   Methicillin resistance NOT DETECTED NOT DETECTED Final   Streptococcus species NOT DETECTED NOT DETECTED Final   Streptococcus agalactiae NOT DETECTED NOT DETECTED Final   Streptococcus pneumoniae NOT DETECTED NOT DETECTED Final   Streptococcus pyogenes NOT DETECTED NOT DETECTED Final   Acinetobacter baumannii NOT DETECTED NOT DETECTED Final   Enterobacteriaceae species NOT DETECTED NOT DETECTED Final   Enterobacter cloacae complex NOT DETECTED NOT DETECTED Final   Escherichia coli NOT DETECTED NOT DETECTED Final   Klebsiella oxytoca NOT DETECTED NOT DETECTED Final   Klebsiella pneumoniae NOT DETECTED NOT DETECTED Final    Proteus species NOT DETECTED NOT DETECTED Final   Serratia marcescens NOT DETECTED NOT DETECTED Final   Carbapenem resistance NOT DETECTED NOT DETECTED Final   Haemophilus influenzae NOT DETECTED NOT DETECTED Final   Neisseria meningitidis NOT DETECTED NOT DETECTED Final   Pseudomonas aeruginosa NOT DETECTED NOT DETECTED Final   Candida albicans NOT DETECTED NOT DETECTED Final   Candida glabrata NOT DETECTED NOT DETECTED Final   Candida krusei NOT DETECTED NOT DETECTED Final   Candida parapsilosis NOT DETECTED NOT DETECTED Final   Candida tropicalis NOT DETECTED NOT DETECTED Final    Comment: Performed at [facility], [address], [place], [place] [address]     Labs: CBC:  Recent Labs Lab 09/28/16 1230 09/30/16 0459 10/01/16 0444  WBC 6.1 5.7 5.3  NEUTROABS 3.9  --   --   HGB 15.1 14.2 15.0  HCT 41.7 40.7 41.6  MCV 88.5 87.2 86.5  PLT 161 143* 152   Basic Metabolic Panel:  Recent Labs Lab 09/28/16 1230 09/29/16 0432 09/30/16 0459 10/01/16 0444  NA 145 142 141 143  K 3.4* 3.6 3.6 3.4*  CL 113* 112* 112* 114*  CO2 24 22 22  21*  GLUCOSE 109* 99 99 100*  BUN 13 17 16 18   CREATININE 1.66* 1.59* 1.61* 1.48*  CALCIUM 8.6* 8.2* 8.3* 8.5*  MG  --   --  1.6*  --    Liver Function Tests:  Recent Labs Lab 09/28/16 1230  AST 21  ALT 16*  ALKPHOS 65  BILITOT 1.3*  PROT 6.7  ALBUMIN 3.6   Time spent: 35 minutes  Signed:  Elaysha Bevard  [facility] 10/01/2016 , 1:28 PM

## 2016-10-01 NOTE — H&P (Signed)
Physical Medicine and Rehabilitation Admission H&P       Chief Complaint  Patient presents with  . 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 Per chart review patient lives in a handicapped accessible apartment. Ambulated with a rolling walker modified independent prior to admission. He was able to shower and dress himself. He has 2 sons who assist with much of his household duties. Presented 09/28/2016 after a recent fall 09/26/2016 without loss of consciousness while trying to get up from his chair as well as reported increased right sided weakness. Cranial CT scan with no acute changes. Lumbar spine films negative. MRI showed acute 1 cm infarction in the left hemisphere radiating white at her tracts posterior corona radiata. No hemorrhage or mass effect. Patient did not receive TPA.Carotid Dopplers with no ICA stenosis.  Echocardiogram with ejection fraction of 04% grade 1 diastolic dysfunction.Marland KitchenMRA with no large vessel occlusion aneurysm or significant stenosis. Currently continues on Plavix for CVA prophylaxis. Subcutaneous Lovenox for DVT prophylaxis. Creatinine 1.66 on admission and no prior labs for comparison and latest creatinine 1.48. Tolerating a regular consistency diet. Urine culture greater than 100,000 Escherichia coli  maintained on Rocephin and changed to Keflex 09/11/2016. Blood culture showing no growth. Physical therapy evaluation completed with recommendations of physical medicine rehabilitation consult.  Review of Systems  Constitutional: Negative for chills and fever.  HENT: Negative for hearing loss.   Eyes: Negative for blurred vision, double vision and discharge.  Respiratory: Negative for cough and shortness of breath.   Cardiovascular: Positive for leg swelling. Negative for chest pain and palpitations.  Gastrointestinal:  Positive for constipation. Negative for nausea and vomiting.  Genitourinary: Positive for urgency.  Musculoskeletal: Positive for myalgias. Negative for joint pain.  Skin: Negative for rash.  Neurological: Positive for focal weakness. Negative for dizziness and seizures.  All other systems reviewed and are negative.      Past Medical History:  Diagnosis Date  . Arthritis   . Hypertension   . Stroke [facility]) 2008   residual R sided weakness   History reviewed. No pertinent surgical history. History reviewed. No pertinent family history. Social History:  reports that he has never smoked. He has never used smokeless tobacco. He reports that he does not drink alcohol or use drugs. Allergies:      Allergies  Allergen Reactions  . Sulfa Antibiotics Rash         Medications Prior to Admission  Medication Sig Dispense Refill  . amLODipine (NORVASC) 5 MG tablet Take 5 mg by mouth daily.    . clopidogrel (PLAVIX) 75 MG tablet Take 75 mg by mouth daily.    . finasteride (PROSCAR) 5 MG tablet Take 5 mg by mouth daily.    . metoprolol succinate (TOPROL-XL) 50 MG 24 hr tablet Take 50 mg by mouth daily. Take with or immediately following a meal.    . ramipril (ALTACE) 10 MG capsule Take 10 mg by mouth daily.    . simvastatin (ZOCOR) 20 MG tablet Take 20 mg by mouth daily.      Home: Home Living Family/patient expects to be discharged to:: Unsure Living Arrangements: Children Additional Comments: lives in handicapped accessible apt with his 2 sons   Functional History: Prior Function Level of Independence: Independent with assistive device(s), Independent Comments: amb with RW mod I, able to shower/dress self (sons help  with transfer only); sons assist with much of household duties  Functional Status:  Mobility: Bed Mobility Overal bed mobility: Needs Assistance Bed Mobility: Sit to Supine, Rolling, Sidelying to Sit Rolling: Min assist Sidelying to sit: Min  assist Sit to supine: Min guard General bed mobility comments: assist to elevated trunk, min/guard to bring LEs onto bed Transfers Overall transfer level: Needs assistance Equipment used: Rolling walker (2 wheeled) Transfers: Sit to/from Stand Sit to Stand: Min assist, +2 safety/equipment, +2 physical assistance General transfer comment:  (pt declined tranfser back to chair) Ambulation/Gait General Gait Details: 2 lateral steps along EOB with +2 min assist and RW, anxious regarding wt shfit/amb  ADL:  Cognition: Cognition Overall Cognitive Status: Within Functional Limits for tasks assessed Orientation Level: Oriented X4 Cognition Arousal/Alertness: Awake/alert Behavior During Therapy: WFL for tasks assessed/performed Overall Cognitive Status: Within Functional Limits for tasks assessed  Physical Exam: Blood pressure (!) 154/91, pulse 68, temperature 97.7 F (36.5 C), temperature source Oral, resp. rate 18, height '5\' 7"'  (1.702 m), weight 88.4 kg (194 lb 14.2 oz), SpO2 97 %. Physical Exam  HENT:  Head: Normocephalic.  Right Ear: External ear normal.  Left Ear: External ear normal.  Eyes: Pupils are equal, round, and reactive to light. EOM are normal.  Neck: Normal range of motion. Neck supple. No JVD present. No tracheal deviation present. No thyromegaly present.  Cardiovascular: Normal rate, regular rhythm and normal heart sounds.  Exam reveals no gallop and no friction rub.   No murmur heard. Respiratory: Effort normal and breath sounds normal. No respiratory distress. He has no wheezes. He has no rales.  GI: Soft. Bowel sounds are normal. He exhibits no distension.  Skin. .Warm and dry Neurological. Alert. Follows commands. He does show some perseveration and decrease in attention as well as delay in processing. Oriented to person, place, date. He can provide his name and age.RUE 2+ to 3-/5 deltoid, biceps, wrist, HI. RLE: 2- to 2+/5 HF, KE, ADF/PF.  LUE 4+/5, LLE: 4 to  4+/5 prox to distal. Senses pain in all 4 but sensation is "different" in right arm and leg. DTR's 1+.  Psych : a little anxious but generally pleasant   Lab Results Last 48 Hours        Results for orders placed or performed during the hospital encounter of 09/28/16 (from the past 48 hour(s))  Urine culture     Status: Abnormal (Preliminary result)   Collection Time: 09/28/16 11:05 AM  Result Value Ref Range   Specimen Description URINE, RANDOM    Special Requests NONE    Culture >=100,000 COLONIES/mL ESCHERICHIA COLI (A)    Report Status PENDING   Urinalysis, Routine w reflex microscopic     Status: Abnormal   Collection Time: 09/28/16 11:50 AM  Result Value Ref Range   Color, Urine YELLOW YELLOW   APPearance HAZY (A) CLEAR   Specific Gravity, Urine 1.014 1.005 - 1.030   pH 5.0 5.0 - 8.0   Glucose, UA NEGATIVE NEGATIVE mg/dL   Hgb urine dipstick SMALL (A) NEGATIVE   Bilirubin Urine NEGATIVE NEGATIVE   Ketones, ur NEGATIVE NEGATIVE mg/dL   Protein, ur 100 (A) NEGATIVE mg/dL   Nitrite POSITIVE (A) NEGATIVE   Leukocytes, UA LARGE (A) NEGATIVE   RBC / HPF 0-5 0 - 5 RBC/hpf   WBC, UA TOO NUMEROUS TO COUNT 0 - 5 WBC/hpf   Bacteria, UA MANY (A) NONE SEEN   Squamous Epithelial / LPF 0-5 (A) NONE SEEN  WBC Clumps PRESENT    Mucous PRESENT   CBC with Differential     Status: Abnormal   Collection Time: 09/28/16 12:30 PM  Result Value Ref Range   WBC 6.1 4.0 - 10.5 K/uL   RBC 4.71 4.22 - 5.81 MIL/uL   Hemoglobin 15.1 13.0 - 17.0 g/dL   HCT 41.7 39.0 - 52.0 %   MCV 88.5 78.0 - 100.0 fL   MCH 32.1 26.0 - 34.0 pg   MCHC 36.2 (H) 30.0 - 36.0 g/dL   RDW 13.4 11.5 - 15.5 %   Platelets 161 150 - 400 K/uL   Neutrophils Relative % 64 %   Neutro Abs 3.9 1.7 - 7.7 K/uL   Lymphocytes Relative 25 %   Lymphs Abs 1.5 0.7 - 4.0 K/uL   Monocytes Relative 9 %   Monocytes Absolute 0.6 0.1 - 1.0 K/uL   Eosinophils Relative 2 %   Eosinophils  Absolute 0.1 0.0 - 0.7 K/uL   Basophils Relative 0 %   Basophils Absolute 0.0 0.0 - 0.1 K/uL  Comprehensive metabolic panel     Status: Abnormal   Collection Time: 09/28/16 12:30 PM  Result Value Ref Range   Sodium 145 135 - 145 mmol/L   Potassium 3.4 (L) 3.5 - 5.1 mmol/L   Chloride 113 (H) 101 - 111 mmol/L   CO2 24 22 - 32 mmol/L   Glucose, Bld 109 (H) 65 - 99 mg/dL   BUN 13 6 - 20 mg/dL   Creatinine, Ser 1.66 (H) 0.61 - 1.24 mg/dL   Calcium 8.6 (L) 8.9 - 10.3 mg/dL   Total Protein 6.7 6.5 - 8.1 g/dL   Albumin 3.6 3.5 - 5.0 g/dL   AST 21 15 - 41 U/L   ALT 16 (L) 17 - 63 U/L   Alkaline Phosphatase 65 38 - 126 U/L   Total Bilirubin 1.3 (H) 0.3 - 1.2 mg/dL   GFR calc non Af Amer 40 (L) >60 mL/min   GFR calc Af Amer 47 (L) >60 mL/min    Comment: (NOTE) The eGFR has been calculated using the CKD EPI equation. This calculation has not been validated in all clinical situations. eGFR's persistently <60 mL/min signify possible Chronic Kidney Disease.    Anion gap 8 5 - 15  I-Stat Troponin, ED (not at [facility])     Status: None   Collection Time: 09/28/16 12:41 PM  Result Value Ref Range   Troponin i, poc 0.01 0.00 - 0.08 ng/mL   Comment 3            Comment: Due to the release kinetics of cTnI, a negative result within the first hours of the onset of symptoms does not rule out myocardial infarction with certainty. If myocardial infarction is still suspected, repeat the test at appropriate intervals.   Blood culture (routine x 2)     Status: None (Preliminary result)   Collection Time: 09/28/16  2:07 PM  Result Value Ref Range   Specimen Description BLOOD LEFT HAND    Special Requests      BOTTLES DRAWN AEROBIC AND ANAEROBIC Blood Culture results may not be optimal due to an inadequate volume of blood received in culture bottles   Culture      NO GROWTH < 24 HOURS Performed at [facility] [address] [place], [place]  [address]    Report Status PENDING   Blood culture (routine x 2)     Status: None (Preliminary result)   Collection  Time: 09/28/16  2:27 PM  Result Value Ref Range   Specimen Description BLOOD LEFT ANTECUBITAL    Special Requests      BOTTLES DRAWN AEROBIC AND ANAEROBIC Blood Culture results may not be optimal due to an inadequate volume of blood received in culture bottles   Culture  Setup Time      GRAM POSITIVE COCCI IN CLUSTERS IN BOTH AEROBIC AND ANAEROBIC BOTTLES CRITICAL RESULT CALLED TO, READ BACK BY AND VERIFIED WITH: E WILLIAMSON,PHARMD AT 1347 09/29/16 BY L BENFIELD Performed at [facility], [place] [address] [place], [place] [address]    Culture GRAM POSITIVE COCCI    Report Status PENDING   Blood Culture ID Panel (Reflexed)     Status: Abnormal   Collection Time: 09/28/16  2:27 PM  Result Value Ref Range   Enterococcus species NOT DETECTED NOT DETECTED   Vancomycin resistance NOT DETECTED NOT DETECTED   Listeria monocytogenes NOT DETECTED NOT DETECTED   Staphylococcus species DETECTED (A) NOT DETECTED    Comment: CRITICAL RESULT CALLED TO, READ BACK BY AND VERIFIED WITH: E WILLIAMSON,PHARMD AT 1347 09/29/16 BY L BENFIELD    Staphylococcus aureus NOT DETECTED NOT DETECTED   Methicillin resistance NOT DETECTED NOT DETECTED   Streptococcus species NOT DETECTED NOT DETECTED   Streptococcus agalactiae NOT DETECTED NOT DETECTED   Streptococcus pneumoniae NOT DETECTED NOT DETECTED   Streptococcus pyogenes NOT DETECTED NOT DETECTED   Acinetobacter baumannii NOT DETECTED NOT DETECTED   Enterobacteriaceae species NOT DETECTED NOT DETECTED   Enterobacter cloacae complex NOT DETECTED NOT DETECTED   Escherichia coli NOT DETECTED NOT DETECTED   Klebsiella oxytoca NOT DETECTED NOT DETECTED   Klebsiella pneumoniae NOT DETECTED NOT DETECTED   Proteus species NOT DETECTED NOT DETECTED   Serratia marcescens NOT DETECTED NOT DETECTED    Carbapenem resistance NOT DETECTED NOT DETECTED   Haemophilus influenzae NOT DETECTED NOT DETECTED   Neisseria meningitidis NOT DETECTED NOT DETECTED   Pseudomonas aeruginosa NOT DETECTED NOT DETECTED   Candida albicans NOT DETECTED NOT DETECTED   Candida glabrata NOT DETECTED NOT DETECTED   Candida krusei NOT DETECTED NOT DETECTED   Candida parapsilosis NOT DETECTED NOT DETECTED   Candida tropicalis NOT DETECTED NOT DETECTED    Comment: Performed at [facility], [address] [place], [place] [address]  Basic metabolic panel     Status: Abnormal   Collection Time: 09/29/16  4:32 AM  Result Value Ref Range   Sodium 142 135 - 145 mmol/L   Potassium 3.6 3.5 - 5.1 mmol/L   Chloride 112 (H) 101 - 111 mmol/L   CO2 22 22 - 32 mmol/L   Glucose, Bld 99 65 - 99 mg/dL   BUN 17 6 - 20 mg/dL   Creatinine, Ser 1.59 (H) 0.61 - 1.24 mg/dL   Calcium 8.2 (L) 8.9 - 10.3 mg/dL   GFR calc non Af Amer 42 (L) >60 mL/min   GFR calc Af Amer 49 (L) >60 mL/min    Comment: (NOTE) The eGFR has been calculated using the CKD EPI equation. This calculation has not been validated in all clinical situations. eGFR's persistently <60 mL/min signify possible Chronic Kidney Disease.    Anion gap 8 5 - 15  Basic metabolic panel     Status: Abnormal   Collection Time: 09/30/16  4:59 AM  Result Value Ref Range   Sodium 141 135 - 145 mmol/L   Potassium 3.6 3.5 - 5.1 mmol/L   Chloride 112 (H)  101 - 111 mmol/L   CO2 22 22 - 32 mmol/L   Glucose, Bld 99 65 - 99 mg/dL   BUN 16 6 - 20 mg/dL   Creatinine, Ser 1.61 (H) 0.61 - 1.24 mg/dL   Calcium 8.3 (L) 8.9 - 10.3 mg/dL   GFR calc non Af Amer 42 (L) >60 mL/min   GFR calc Af Amer 48 (L) >60 mL/min    Comment: (NOTE) The eGFR has been calculated using the CKD EPI equation. This calculation has not been validated in all clinical situations. eGFR's persistently <60 mL/min signify possible Chronic Kidney Disease.    Anion  gap 7 5 - 15  CBC     Status: Abnormal   Collection Time: 09/30/16  4:59 AM  Result Value Ref Range   WBC 5.7 4.0 - 10.5 K/uL   RBC 4.67 4.22 - 5.81 MIL/uL   Hemoglobin 14.2 13.0 - 17.0 g/dL   HCT 40.7 39.0 - 52.0 %   MCV 87.2 78.0 - 100.0 fL   MCH 30.4 26.0 - 34.0 pg   MCHC 34.9 30.0 - 36.0 g/dL   RDW 13.5 11.5 - 15.5 %   Platelets 143 (L) 150 - 400 K/uL  Magnesium     Status: Abnormal   Collection Time: 09/30/16  4:59 AM  Result Value Ref Range   Magnesium 1.6 (L) 1.7 - 2.4 mg/dL      Imaging Results (Last 48 hours)  Dg Lumbar Spine Complete  Result Date: 09/28/2016 CLINICAL DATA:  Pt had a fall 2 days ago at home after trying to get up from couch to go to the bathroom. Hx of stroke in 2008, with residual right sided weakness. States since his fall, he has not been able to feel his right leg and has no muscle control of it. EXAM: LUMBAR SPINE - COMPLETE 4+ VIEW COMPARISON:  None. FINDINGS: No fracture. Slight anterolisthesis of L4 on L5, degenerative in origin. No other spondylolisthesis. Moderate loss of disc height at L4-L5 and L5-S1. Endplate osteophytes noted most prominently at these levels. There is facet degenerative change bilaterally at L4-L5-L5-S1. Bones are demineralized. Soft tissues are unremarkable. IMPRESSION: 1. No fracture or acute finding. 2. Degenerative changes as detailed. Electronically Signed   By: Lajean Manes M.D.   On: 09/28/2016 12:27   Ct Head Wo Contrast  Result Date: 09/28/2016 CLINICAL DATA:  Fall 2 days ago. Fall yesterday as well. Dizziness. Stroke in 2008 right-sided weakness. EXAM: CT HEAD WITHOUT CONTRAST TECHNIQUE: Contiguous axial images were obtained from the base of the skull through the vertex without intravenous contrast. COMPARISON:  None. FINDINGS: Brain: No subdural, epidural, or subarachnoid hemorrhage. Cerebellum, brainstem, and basal cisterns are normal. Ventricles and sulci are prominent. White matter changes are  identified. No acute cortical ischemia or infarct. No mass, mass effect, or midline shift. Vascular: Calcified atherosclerosis is seen in the intracranial carotid arteries. Skull: Normal. Negative for fracture or focal lesion. Sinuses/Orbits: No acute finding. Other: None. IMPRESSION: 1. No acute abnormalities identified. Chronic white matter changes. Prominence of ventricles and sulci may represent volume loss. Given the history of falls, recommend correlation for symptoms of normal pressure hydrocephalus as well. Electronically Signed   By: Dorise Bullion III M.D   On: 09/28/2016 12:15   Mr Brain Wo Contrast  Result Date: 09/29/2016 CLINICAL DATA:  Acute presentation with ataxia.  Assess for stroke. EXAM: MRI HEAD WITHOUT CONTRAST TECHNIQUE: Multiplanar, multiecho pulse sequences of the brain and surrounding structures were obtained without intravenous  contrast. COMPARISON:  CT 09/28/2016 FINDINGS: Brain: diffusion imaging shows a 1 cm acute infarction on the left in the radiating white matter tracts of the posterior corona radiata. No cortical or large vessel territory infarction. Elsewhere, there chronic small-vessel ischemic changes affecting the pons. There are old small vessel cerebellar infarctions. Cerebral hemispheres show old small vessel infarctions affecting the thalami, basal ganglia and hemispheric white matter. No mass lesion, hemorrhage, hydrocephalus or extra-axial collection. Vascular: Major vessels at the base of the brain show flow. Skull and upper cervical spine: Negative Sinuses/Orbits: Clear except for retention cyst in the right maxillary sinus and right division of the sphenoid sinus. Orbits negative. Other: None IMPRESSION: Acute 1 cm infarction in the left hemisphere radiating white matter tracts/posterior corona radiata. No hemorrhage or mass effect. Extensive chronic small vessel ischemic changes elsewhere throughout the brain. Electronically Signed   By: Nelson Chimes M.D.   On:  09/29/2016 13:45        Medical Problem List and Plan: 1.  Right side weakness secondary to acute infarction left hemisphere/corona radiata. History of CVA 2010 with right-sided weakness             -admit to inpatient rehab 2.  DVT Prophylaxis/Anticoagulation: Subcutaneous Lovenox. Monitor for any bleeding episodes 3. Pain Management: Tylenol as needed 4. Mood: Provide emotional support 5. Neuropsych: This patient is capable of making decisions on his own behalf. 6. Skin/Wound Care: Routine skin checks 7. Fluids/Electrolytes/Nutrition: Routine I&O with follow-up chemistries upon admit 8.AKI versus CRI. Follow-up chemistries 9. Escherichia coli urinary tract infection. Rocephin changed to Keflex 10/01/2016 10. Hyperlipidemia. Lipitor 11. Hypertension. Monitor with increased mobility 12. BPH. Proscar             -discussed moving away from condom cath.             -up out of bed to void when possible  Post Admission Physician Evaluation: 1. Functional deficits secondary  to left corona radiata infarct. 2. Patient is admitted to receive collaborative, interdisciplinary care between the physiatrist, rehab nursing staff, and therapy team. 3. Patient's level of medical complexity and substantial therapy needs in context of that medical necessity cannot be provided at a lesser intensity of care such as a SNF. 4. Patient has experienced substantial functional loss from his/her baseline which was documented above under the "Functional History" and "Functional Status" headings.  Judging by the patient's diagnosis, physical exam, and functional history, the patient has potential for functional progress which will result in measurable gains while on inpatient rehab.  These gains will be of substantial and practical use upon discharge  in facilitating mobility and self-care at the household level. 5. Physiatrist will provide 24 hour management of medical needs as well as oversight of the  therapy plan/treatment and provide guidance as appropriate regarding the interaction of the two. 6. The Preadmission Screening has been reviewed and patient status is unchanged unless otherwise stated above. 7. 24 hour rehab nursing will assist with bladder management, bowel management, safety, skin/wound care, disease management, medication administration, pain management and patient education  and help integrate therapy concepts, techniques,education, etc. 8. PT will assess and treat for/with: Lower extremity strength, range of motion, stamina, balance, functional mobility, safety, adaptive techniques and equipment, NMR, ego support, family education.   Goals are: supervision to mod I. 9. OT will assess and treat for/with: ADL's, functional mobility, safety, upper extremity strength, adaptive techniques and equipment, NMR, family ed, community reentry.   Goals are: supervision to mod  I. Therapy may proceed with showering this patient. 10. SLP will assess and treat for/with: cognition, speech, communication.  Goals are: mod I to supervision. 11. Case Management and Social Worker will assess and treat for psychological issues and discharge planning. 12. Team conference will be held weekly to assess progress toward goals and to determine barriers to discharge. 13. Patient will receive at least 3 hours of therapy per day at least 5 days per week. 14. ELOS: 10-14 days       15. Prognosis:  excellent     Meredith Staggers, MD, [facility] 10/01/2016  Cathlyn Parsons., PA-C 09/30/2016

## 2016-10-01 NOTE — Progress Notes (Signed)
Pt will discharge to CIR today.

## 2016-10-01 NOTE — Plan of Care (Signed)
Problem: RH SKIN INTEGRITY Goal: RH STG SKIN FREE OF INFECTION/BREAKDOWN No new skin injuries/breakdown.

## 2016-10-01 NOTE — Progress Notes (Signed)
I met with pt at bedside to discuss a possible inpt rehab admit. He is in agreement and realizes he will need some rehab before returning home with his sons. I need clarification of when medical workup is complete so I can verify if I will have a bed available at that time. Please call me after seeing pt today Dr. Posey Pronto.[phone]

## 2016-10-01 NOTE — PMR Pre-admission (Signed)
[facility] PMR Admission Coordinator Pre-Admission Assessment  Patient: Robert Buckley is an 71 y.o., male MRN: [id number] DOB: October 05, 1945 Height: _0  (170.2 cm) Weight: 88.4 kg (194 lb 14.2 oz)  Insurance Information HMO:     PPO:      PCP:      IPA:      80/20: yes     OTHER: no HMO PRIMARY: Medicare a and b      Policy#: [id number]      Subscriber: pt Benefits:  Phone #: passport one online     Name: 10/01/2016 Eff. Date: 01/12/2011     Deduct: $1340      Out of Pocket Max: none      Life Max: none CIR: 100%      SNF: 20 full days Outpatient: 80%     Co-Pay: 20% Home Health: 100%      Co-Pay: none DME: 80%     Co-Pay: 20% Providers: pt choice  SECONDARY: [employer]      Policy#: [id number]      Subscriber: pt  Medicaid Application Date:       Case Manager:  Disability Application Date:       Case Worker:    Emergency Contact Information Contact Information    Name Relation Home Work Mobile   [place] Son   [phone]      Current Medical History  Patient Admitting Diagnosis: left CVA  History of Present Illness: BHA:[id number] Robert Buckley is a 71 year old right-handed male with 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  Presented 09/28/2016 after a recent fall 09/26/2016 without loss of consciousness while trying to get up from his chair as well as reported increased right sided weakness. Cranial CT scan with no acute changes. Lumbar spine films negative. MRI showed acute 1 cm infarction in the left hemisphere radiating white at her tracts posterior corona radiata. No hemorrhage or mass effect. Patient did not receive TPA.Carotid Dopplers with no ICA stenosis.  Echocardiogram with ejection fraction of 40% grade 1 diastolic dysfunction.Marland KitchenMRA with no large vessel occlusion aneurysm or significant stenosis. Currently continues on Plavix for CVA prophylaxis. Subcutaneous Lovenox for DVT prophylaxis. Creatinine  1.66 on admission and no prior labs for comparison and latest creatinine 1.48. Tolerating a regular consistency diet. Urine culture greater than 100,000 Escherichia coli currently maintained on Rocephin. Blood culture showing no growth.   Patient's medical record from [facility] has been reviewed by the rehabilitation admission coordinator and physician.  NIH Stroke scale: 6 Glascow Coma Scale:  Past Medical History  Past Medical History:  Diagnosis Date  . Arthritis   . Hypertension   . Stroke [facility]) 2008   residual R sided weakness    Family History   family history is not on file.  Prior Rehab/Hospitalizations Has the patient had major surgery during 100 days prior to admission? No    Current Medications SEE MAR   Patients Current Diet:  Regular diet with thin liquids  Precautions / Restrictions Precautions Precautions: Fall Precaution Comments: R hemiparesis Restrictions Weight Bearing Restrictions: No   Has the patient had 2 or more falls or a fall with injury in the past year?No fall once 2 days pta  Prior Activity Level Limited Community (1-2x/wk): Pt mod I RW pta, does own adls, does not drive since 9735. Uses seated RW, needs assist into shower but able to bathe and dress himself.  Prior Functional Level Self Care:  Did the patient need help bathing, dressing, using the toilet or eating?  Independent  Indoor Mobility: Did the patient need assistance with walking from room to room (with or without device)? Independent  Stairs: Did the patient need assistance with internal or external stairs (with or without device)? Independent  Functional Cognition: Did the patient need help planning regular tasks such as shopping or remembering to take medications? Needed some help  Home Assistive Devices / Equipment Home Assistive Devices/Equipment:  (RW with seat) Home Equipment: Shower seat, Grab bars - tub/shower, Hand held shower head, Walker - 4  wheels  Prior Device Use: Indicate devices/aids used by the patient prior to current illness, exacerbation or injury? Walker   Prior Functional Level Current Functional Level  Bed Mobility  Mod Independent  Min assist   Transfers  Mod Independent  Min assist, Mod assist   Mobility - Walk/Wheelchair  Mod Independent  Mod assist (mod to max assist  EVA walker 14 feet. Increased time to adv)ance RLE   Upper Body Dressing  Mod Independent  Min assist   Lower Body Dressing  Mod Independent  Max assist   Grooming  Mod Independent  Min assist   Eating/Drinking  Independent  Min assist   Toilet Transfer  Mod Independent  Min assist   Bladder Continence   continent  incontinent; condom catheter   Bowel Management  continent  continent LBM 09/28/16   Stair Climbing  Not needed   (not attempted)   Communication  intact  intact   Memory  intact  intact   Cooking/Meal Prep  pt would warm up leftovers for lunch or sandwich      Housework  dependent    Money Management  dependent    Driving   not since 2426 after CVA      Special needs/care consideration BiPAP/CPAP  N/a CPM n/a Continuous Drip IV  N/a Dialysis  N/a Life Vest  N/a Oxygen  N/a Special Bed  N/a Trach Size  N/a Wound Vac n/a Skin  intact Bowel mgmt: continent LBM 09/28/16 Bladder mgmt: incontinent ; condom catheter Diabetic mgmt n/a  Previous Home Environment Living Arrangements: Children (pt lives with his 2 sons, Cecilie Lowers and Vicente Males)  Lives With: Family, Son Available Help at Discharge:  Vicente Males works days at [employer]; Cecilie Lowers currently unemployed) Type of Home: Apartment Home Layout: One level Home Access: Level entry Bathroom Shower/Tub: Multimedia programmer: Handicapped height Bathroom Accessibility: Yes How Accessible: Accessible via [place]: No Additional Comments: lives in handicapped accessible apt with his 2 sons  Discharge Living  Setting Plans for Discharge Living Setting: Apartment, Lives with (comment) (lives with 2 sons, Cecilie Lowers and Vicente Males) Type of Home at Discharge: Apartment Discharge Home Layout: One level Discharge Home Access: Level entry (handicapped accessible apartment) Discharge Bathroom Shower/Tub: Walk-in shower Discharge Bathroom Toilet: Handicapped height Discharge Bathroom Accessibility: Yes How Accessible: Accessible via walker Does the patient have any problems obtaining your medications?: No  Social/Family/Support Systems Patient Roles: 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 Information: Cecilie Lowers, son Anticipated Caregiver: sons Anticipated Ambulance person Information: see above Ability/Limitations of Caregiver: [place] currently unemployed Caregiver Availability: 24/7 Discharge Plan Discussed with Primary Caregiver: Yes Is Caregiver In Agreement with Plan?: Yes Does Caregiver/Family have Issues with Lodging/Transportation while Pt is in Rehab?: No  Goals/Additional Needs Patient/Family Goal for Rehab: Mod I to superivision with PT, mod I to supervision with OT and SLP Expected length of stay: ELOS 10-  14 days Equipment Needs: fall prevention bundle Pt/Family Agrees to Admission and willing to participate: Yes Program Orientation Provided & Reviewed with Pt/Caregiver Including Roles  & Responsibilities: Yes  Barriers to Discharge: Incontinence  Patient Condition: I have reviewed medical records and met with pt at bedside for assessment. Patient will benefit from ongoing PT, OT, and SLP offered with an inpatient acute rehabilitation admission. He will receive 3 hours per day of therapy as well benefit from the coordinated Team approach offered though therapy, Rehabilatative Physician and Nursing care. He is currently overall mod to max assist with mobility and ADLs. We will admit to Acute inpatient Rehabilatation today.  Preadmission Screen Completed By:  Cleatrice Burke, 10/01/2016 10:28 AM ______________________________________________________________________   Discussed status with Dr. Naaman Plummer  on  10/01/2016  at  1039 and received telephone approval for admission today.  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 Does the need for close, 24 hr/day  Medical supervision in concert with the patient's rehab needs make it unreasonable for this patient to be served in a less intensive setting? Yes 2. Co-Morbidities requiring supervision/potential complications: post-stroke sequelae, htn, renal insufficiency, prior cva 3. Due to bladder management, bowel management, safety, skin/wound care, disease management, medication administration, pain management and patient education, does the patient require 24 hr/day rehab nursing? Yes 4. Does the patient require coordinated care of a physician, rehab nurse, PT (1-2 hrs/day, 5 days/week), OT (1-2 hrs/day, 5 days/week) and SLP (1-2 hrs/day, 5 days/week) to address physical and functional deficits in the context of the above medical diagnosis(es)? Yes Addressing deficits in the following areas: balance, endurance, locomotion, strength, transferring, bowel/bladder control, bathing, dressing, feeding, grooming, toileting, cognition, speech and psychosocial support 5. Can the patient actively participate in an intensive therapy program of at least 3 hrs of therapy 5 days a week? Yes 6. The potential for patient to make measurable gains while on inpatient rehab is excellent 7. Anticipated functional outcomes upon discharge from inpatients are: modified independent and supervision PT, modified independent and supervision OT, modified independent and supervision SLP 8. Estimated rehab length of stay to reach the above functional goals is: 10-14 days 9. Does the patient have adequate social supports to accommodate  these discharge functional goals? Yes 10. Anticipated D/C setting: Home 11. Anticipated post D/C treatments: HH therapy and Outpatient therapy 12. Overall Rehab/Functional Prognosis: excellent    RECOMMENDATIONS: This patient's condition is appropriate for continued rehabilitative care in the following setting: CIR Patient has agreed to participate in recommended program. Yes Note that insurance prior authorization may be required for reimbursement for recommended care.  Comment:  Meredith Staggers, MD, [place] [facility] 10/01/2016   Cleatrice Burke 10/01/2016

## 2016-10-01 NOTE — Progress Notes (Signed)
I have an inpt rehab bed available to admit pt to today. Dr. Allena Katz and I have discussed d/c to CIR today. I contacted pt's son, Tasia Catchings, by phone and he is in agreement to admit. RN CM made aware. I will contact pt's RN to make arrangements to admit pt today. [phone]

## 2016-10-01 NOTE — Progress Notes (Signed)
[facility] arrive to take pt to Rehab, pt stable condition. SRP, RN

## 2016-10-02 ENCOUNTER — Inpatient Hospital Stay (HOSPITAL_COMMUNITY): Payer: Medicare Other | Admitting: Occupational Therapy

## 2016-10-02 ENCOUNTER — Inpatient Hospital Stay (HOSPITAL_COMMUNITY): Payer: Medicare Other

## 2016-10-02 ENCOUNTER — Inpatient Hospital Stay (HOSPITAL_COMMUNITY): Payer: Medicare Other | Admitting: Speech Pathology

## 2016-10-02 LAB — COMPREHENSIVE METABOLIC PANEL
ALBUMIN: 3.2 g/dL — AB (ref 3.5–5.0)
ALT: 20 U/L (ref 17–63)
AST: 20 U/L (ref 15–41)
Alkaline Phosphatase: 61 U/L (ref 38–126)
Anion gap: 8 (ref 5–15)
BILIRUBIN TOTAL: 1.1 mg/dL (ref 0.3–1.2)
BUN: 15 mg/dL (ref 6–20)
CHLORIDE: 113 mmol/L — AB (ref 101–111)
CO2: 22 mmol/L (ref 22–32)
Calcium: 8.5 mg/dL — ABNORMAL LOW (ref 8.9–10.3)
Creatinine, Ser: 1.63 mg/dL — ABNORMAL HIGH (ref 0.61–1.24)
GFR calc Af Amer: 48 mL/min — ABNORMAL LOW (ref >60)
GFR calc non Af Amer: 41 mL/min — ABNORMAL LOW (ref >60)
GLUCOSE: 92 mg/dL (ref 65–99)
POTASSIUM: 3.4 mmol/L — AB (ref 3.5–5.1)
SODIUM: 143 mmol/L (ref 135–145)
TOTAL PROTEIN: 6.2 g/dL — AB (ref 6.5–8.1)

## 2016-10-02 LAB — CBC WITH DIFFERENTIAL/PLATELET
BASOS ABS: 0 10*3/uL (ref 0.0–0.1)
BASOS PCT: 0 %
EOS ABS: 0.1 10*3/uL (ref 0.0–0.7)
Eosinophils Relative: 3 %
HEMATOCRIT: 41 % (ref 39.0–52.0)
Hemoglobin: 14.3 g/dL (ref 13.0–17.0)
Lymphocytes Relative: 31 %
Lymphs Abs: 1.5 10*3/uL (ref 0.7–4.0)
MCH: 30.7 pg (ref 26.0–34.0)
MCHC: 34.9 g/dL (ref 30.0–36.0)
MCV: 88 fL (ref 78.0–100.0)
MONO ABS: 0.4 10*3/uL (ref 0.1–1.0)
Monocytes Relative: 8 %
NEUTROS ABS: 2.9 10*3/uL (ref 1.7–7.7)
NEUTROS PCT: 58 %
Platelets: 162 10*3/uL (ref 150–400)
RBC: 4.66 MIL/uL (ref 4.22–5.81)
RDW: 13.5 % (ref 11.5–15.5)
WBC: 4.9 10*3/uL (ref 4.0–10.5)

## 2016-10-02 MED ORDER — POTASSIUM CHLORIDE CRYS ER 20 MEQ PO TBCR
20.0000 meq | EXTENDED_RELEASE_TABLET | Freq: Two times a day (BID) | ORAL | Status: AC
  Administered 2016-10-02 – 2016-10-03 (×4): 20 meq via ORAL
  Filled 2016-10-02 (×3): qty 1

## 2016-10-02 MED ORDER — METOPROLOL SUCCINATE ER 50 MG PO TB24
50.0000 mg | ORAL_TABLET | Freq: Every day | ORAL | Status: DC
  Administered 2016-10-02 – 2016-10-16 (×15): 50 mg via ORAL
  Filled 2016-10-02 (×15): qty 1

## 2016-10-02 NOTE — Patient Care Conference (Signed)
Inpatient RehabilitationTeam Conference and Plan of Care Update Date: 10/02/2016   Time: 10:30 AM    Patient Name: Robert Buckley      Medical Record Number: [id number]  Date of Birth: 1945-06-08 Sex: Male         Room/Bed: 4M10C/4M10C-01 Payor Info: Payor: MEDICARE / Plan: MEDICARE PART A AND B / Product Type: *No Product type* /    Admitting Diagnosis: CVA  Admit Date/Time:  10/01/2016  1:22 PM Admission Comments: No comment available   Primary Diagnosis:  Left sided cerebral hemisphere cerebrovascular accident (CVA) (HCC) Principal Problem: Left sided cerebral hemisphere cerebrovascular accident (CVA) [facility])  Patient Active Problem List   Diagnosis Date Noted  . Left sided cerebral hemisphere cerebrovascular accident (CVA) (HCC) 10/01/2016  . Right hemiparesis (HCC) 10/01/2016  . Acute CVA (cerebrovascular accident) (HCC) 09/30/2016  . Dyslipidemia 09/30/2016  . Hypomagnesemia 09/30/2016  . AKI (acute kidney injury) (HCC) 09/30/2016  . Hypokalemia 09/30/2016  . Fall at home, initial encounter 09/30/2016  . Ataxia 09/30/2016  . Essential hypertension   . UTI (urinary tract infection) 09/28/2016    Expected Discharge Date:    Team Members Present: Physician leading conference: Dr. Claudette Laws Social Worker Present: Dossie Der, LCSW Nurse Present: Allayne Stack, RN PT Present: Woodfin Ganja, PT OT Present: Perrin Maltese, OT SLP Present: Jackalyn Lombard, SLP PPS Coordinator present : Tora Duck, RN, CRRN     Current Status/Progress Goal Weekly Team Focus  Medical     adjusting meds   medical stability     Bowel/Bladder   incont of b/b; able to sense urge to defecate but not to urinate; condom cath at night; LBM 8/21 per pt  urinal use q3 hours while awake  offer urinal q3hr while awake   Swallow/Nutrition/ Hydration   pending ST evaluation          ADL's     new eval        Mobility     new eval        Communication   pending ST evaluation          Safety/Cognition/ Behavioral Observations  pending ST evaluation         Pain   denies  3 out of 10 or less  assess q shift and prn   Skin   CDI  no new skin breakdown  assess q shift and prn      *See Care Plan and progress notes for long and short-term goals.     Barriers to Discharge  Current Status/Progress Possible Resolutions Date Resolved   Physician                    Nursing                  PT                    OT Decreased caregiver support                SLP                SW Decreased caregiver support Does not have 24 hr care per pt            Discharge Planning/Teaching Needs:    Home with tow son's who will not be there during the day-one works and other looking for a job.     Team Discussion:  Team evaluating pt and setting goals. Pt  hopefully can be mod/i level goals to be safe at home alone while they are at work  Revisions to Treatment Plan:  New eval       [name], Lemar Livings 10/02/2016, 3:01 PM

## 2016-10-02 NOTE — Care Management Note (Signed)
Inpatient Rehabilitation Center Individual Statement of Services  Patient Name:  Delmonte Yerk  Date:  10/02/2016  Welcome to the Inpatient Rehabilitation Center.  Our goal is to provide you with an individualized program based on your diagnosis and situation, designed to meet your specific needs.  With this comprehensive rehabilitation program, you will be expected to participate in at least 3 hours of rehabilitation therapies Monday-Friday, with modified therapy programming on the weekends.  Your rehabilitation program will include the following services:  Physical Therapy (PT), Occupational Therapy (OT), Speech Therapy (ST), 24 hour per day rehabilitation nursing, Therapeutic Recreaction (TR), Neuropsychology, Case Management (Social Worker), Rehabilitation Medicine, Nutrition Services and Pharmacy Services  Weekly team conferences will be held on Wednesday to discuss your progress.  Your Social Worker will talk with you frequently to get your input and to update you on team discussions.  Team conferences with you and your family in attendance may also be held.  Expected length of stay: 2 weeks  Overall anticipated outcome: supervision-min with ambulation  Depending on your progress and recovery, your program may change. Your Social Worker will coordinate services and will keep you informed of any changes. Your Social Worker's name and contact numbers are listed  below.  The following services may also be recommended but are not provided by the Inpatient Rehabilitation Center:    Home Health Rehabiltiation Services  Outpatient Rehabilitation Services    Arrangements will be made to provide these services after discharge if needed.  Arrangements include referral to agencies that provide these services.  Your insurance has been verified to be:  Medicare & Cigna Your primary doctor is:  None  Pertinent information will be shared with your doctor and your insurance company.  Social  Worker:  Dossie Der, SW [phone] or [phone]  Information discussed with and copy given to patient by: Lucy Chris, 10/02/2016, 9:31 AM

## 2016-10-02 NOTE — Progress Notes (Signed)
Subjective/Complaints:   Objective: Vital Signs: Blood pressure (!) 147/93, pulse 80, temperature 98.5 F (36.9 C), temperature source Oral, resp. rate 18, height '5\' 7"'  (1.702 m), weight 89.4 kg (197 lb 3.2 oz), SpO2 95 %. [name] DG Contrast  Result Date: 09/30/2016 CLINICAL DATA:  71 y/o  M; acute CVA lacunar infarct for follow-up. EXAM: MRA HEAD WITHOUT CONTRAST TECHNIQUE: Angiographic images of the Circle of Willis were obtained using MRA technique without intravenous contrast. COMPARISON:  09/29/2016 MRI of the head. FINDINGS: Anterior circulation: Right distal cavernous ICA mild stenosis, likely related to atherosclerosis. Otherwise no large vessel occlusion, aneurysm, or significant stenosis is identified. Posterior circulation: No large vessel occlusion, aneurysm, or significant stenosis is identified. Left dominant vertebrobasilar system. Posterior circulation: No anterior posterior communicating artery identified, likely hypoplastic or absent. IMPRESSION: Patent circle of Willis. No large vessel occlusion, aneurysm, or significant stenosis is identified. Electronically Signed   By: Kristine Garbe M.D.   On: 09/30/2016 22:38   Results for orders placed or performed during the hospital encounter of 10/01/16 (from the past 72 hour(s))  CBC     Status: None   Collection Time: 10/01/16  2:51 PM  Result Value Ref Range   WBC 5.9 4.0 - 10.5 K/uL   RBC 4.76 4.22 - 5.81 MIL/uL   Hemoglobin 14.9 13.0 - 17.0 g/dL   HCT 42.5 39.0 - 52.0 %   MCV 89.3 78.0 - 100.0 fL   MCH 31.3 26.0 - 34.0 pg   MCHC 35.1 30.0 - 36.0 g/dL   RDW 13.8 11.5 - 15.5 %   Platelets 157 150 - 400 K/uL  Creatinine, serum     Status: Abnormal   Collection Time: 10/01/16  2:51 PM  Result Value Ref Range   Creatinine, Ser 1.76 (H) 0.61 - 1.24 mg/dL   GFR calc non Af Amer 37 (L) >60 mL/min   GFR calc Af Amer 43 (L) >60 mL/min    Comment: (NOTE) The eGFR has been calculated using the CKD EPI equation. This  calculation has not been validated in all clinical situations. eGFR's persistently <60 mL/min signify possible Chronic Kidney Disease.   CBC WITH DIFFERENTIAL     Status: None   Collection Time: 10/02/16  4:45 AM  Result Value Ref Range   WBC 4.9 4.0 - 10.5 K/uL   RBC 4.66 4.22 - 5.81 MIL/uL   Hemoglobin 14.3 13.0 - 17.0 g/dL   HCT 41.0 39.0 - 52.0 %   MCV 88.0 78.0 - 100.0 fL   MCH 30.7 26.0 - 34.0 pg   MCHC 34.9 30.0 - 36.0 g/dL   RDW 13.5 11.5 - 15.5 %   Platelets 162 150 - 400 K/uL   Neutrophils Relative % 58 %   Neutro Abs 2.9 1.7 - 7.7 K/uL   Lymphocytes Relative 31 %   Lymphs Abs 1.5 0.7 - 4.0 K/uL   Monocytes Relative 8 %   Monocytes Absolute 0.4 0.1 - 1.0 K/uL   Eosinophils Relative 3 %   Eosinophils Absolute 0.1 0.0 - 0.7 K/uL   Basophils Relative 0 %   Basophils Absolute 0.0 0.0 - 0.1 K/uL  Comprehensive metabolic panel     Status: Abnormal   Collection Time: 10/02/16  4:45 AM  Result Value Ref Range   Sodium 143 135 - 145 mmol/L   Potassium 3.4 (L) 3.5 - 5.1 mmol/L   Chloride 113 (H) 101 - 111 mmol/L   CO2 22 22 - 32 mmol/L  Glucose, Bld 92 65 - 99 mg/dL   BUN 15 6 - 20 mg/dL   Creatinine, Ser 1.63 (H) 0.61 - 1.24 mg/dL   Calcium 8.5 (L) 8.9 - 10.3 mg/dL   Total Protein 6.2 (L) 6.5 - 8.1 g/dL   Albumin 3.2 (L) 3.5 - 5.0 g/dL   AST 20 15 - 41 U/L   ALT 20 17 - 63 U/L   Alkaline Phosphatase 61 38 - 126 U/L   Total Bilirubin 1.1 0.3 - 1.2 mg/dL   GFR calc non Af Amer 41 (L) >60 mL/min   GFR calc Af Amer 48 (L) >60 mL/min    Comment: (NOTE) The eGFR has been calculated using the CKD EPI equation. This calculation has not been validated in all clinical situations. eGFR's persistently <60 mL/min signify possible Chronic Kidney Disease.    Anion gap 8 5 - 15     HEENT: normal Cardio: RRR and no murmur Resp: CTA B/L and unlabored GI: BS positive and NT, ND Extremity:  Pulses positive and No Edema Skin:   Intact Neuro: Alert/Oriented, Abnormal Motor  3-/5 R delt, bi, tri, grip , HF, KE, ADF and Abnormal FMC Ataxic/ dec FMC Musc/Skel:  Normal and Other no pain with UE or LE ROM Gen NAD   Assessment/Plan: 1. Functional deficits secondary to left corona radiata infarct which require 3+ hours per day of interdisciplinary therapy in a comprehensive inpatient rehab setting. Physiatrist is providing close team supervision and 24 hour management of active medical problems listed below. Physiatrist and rehab team continue to assess barriers to discharge/monitor patient progress toward functional and medical goals. FIM:       Function - Toileting Toileting steps completed by helper: Performs perineal hygiene, Adjust clothing prior to toileting, Adjust clothing after toileting Toileting Assistive Devices: Grab bar or rail Assist level: Two helpers           Function - Comprehension Comprehension: Auditory Comprehension assist level: Follows basic conversation/direction with extra time/assistive device  Function - Expression Expression: Verbal Expression assist level: Expresses basic needs/ideas: With extra time/assistive device  Function - Social Interaction Social Interaction assist level: Interacts appropriately with others with medication or extra time (anti-anxiety, antidepressant).  Function - Problem Solving Problem solving assist level: Solves basic 90% of the time/requires cueing < 10% of the time    Medical Problem List and Plan: 1. Right side weaknesssecondary to acute infarction left hemisphere/corona radiata. 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 DVT Prophylaxis/Anticoagulation: Subcutaneous Lovenox. Monitor for any bleeding episodes 3. Pain Management: Tylenol as needed 4. Mood: Provide emotional support 5. Neuropsych: This patient iscapable of making decisions on hisown behalf. 6. Skin/Wound Care: Routine skin checks 7. Fluids/Electrolytes/Nutrition: Routine I&O with  follow-up chemistries upon admit 8.AKIversus CRI. Follow-up chemistries 9.Escherichia coli urinary tract infection. Rocephin changed to Keflex 10/01/2016 10.Hyperlipidemia. Lipitor 11.Hypertension. Monitor with increased mobility Vitals:   10/01/16 1354 10/02/16 0438  BP: (!) 127/95 (!) 147/93  Pulse: 71 80  Resp: 18 18  Temp: 98.4 F (36.9 C) 98.5 F (36.9 C)  SpO2: 95% 95%   12.BPH. Proscar -discussed moving away from condom cath. -up out of bed to void when possible    LOS (Days) 1 A FACE TO FACE EVALUATION WAS PERFORMED  KIRSTEINS,ANDREW E 10/02/2016, 8:31 AM

## 2016-10-02 NOTE — Progress Notes (Signed)
Social Work Sigurd Pugh, Elveria Rising Social Worker Signed   Patient Care Conference Date of Service: 10/02/2016  3:01 PM      Hide copied text Hover for attribution information Inpatient RehabilitationTeam Conference and Plan of Care Update Date: 10/02/2016   Time: 10:30 AM      Patient Name: Robert Buckley      Medical Record Number: [id number]  Date of Birth: [birth year] Sex: Male         Room/Bed: 4M10C/4M10C-01 Payor Info: Payor: MEDICARE / Plan: MEDICARE PART A AND B / Product Type: *No Product type* /     Admitting Diagnosis: CVA  Admit Date/Time:  10/01/2016  1:22 PM Admission Comments: No comment available    Primary Diagnosis:  Left sided cerebral hemisphere cerebrovascular accident (CVA) (HCC) Principal Problem: Left sided cerebral hemisphere cerebrovascular accident (CVA) [facility])       Patient Active Problem List    Diagnosis Date Noted  . Left sided cerebral hemisphere cerebrovascular accident (CVA) (HCC) 10/01/2016  . Right hemiparesis (HCC) 10/01/2016  . Acute CVA (cerebrovascular accident) (HCC) 09/30/2016  . Dyslipidemia 09/30/2016  . Hypomagnesemia 09/30/2016  . AKI (acute kidney injury) (HCC) 09/30/2016  . Hypokalemia 09/30/2016  . Fall at home, initial encounter 09/30/2016  . Ataxia 09/30/2016  . Essential hypertension    . UTI (urinary tract infection) 09/28/2016      Expected Discharge Date:     Team Members Present: Physician leading conference: Dr. Claudette Laws Social Worker Present: Dossie Der, LCSW Nurse Present: Allayne Stack, RN PT Present: Woodfin Ganja, PT OT Present: Perrin Maltese, OT SLP Present: Jackalyn Lombard, SLP PPS Coordinator present : Tora Duck, RN, CRRN       Current Status/Progress Goal Weekly Team Focus  Medical       adjusting meds   medical stability     Bowel/Bladder     incont of b/b; able to sense urge to defecate but not to urinate; condom cath at night; LBM 8/21 per pt  urinal use q3 hours while awake  offer  urinal q3hr while awake   Swallow/Nutrition/ Hydration     pending ST evaluation          ADL's       new eval        Mobility       new eval        Communication     pending ST evaluation         Safety/Cognition/ Behavioral Observations   pending ST evaluation         Pain     denies  3 out of 10 or less  assess q shift and prn   Skin     CDI  no new skin breakdown  assess q shift and prn     *See Care Plan and progress notes for long and short-term goals.      Barriers to Discharge   Current Status/Progress Possible Resolutions Date Resolved   Physician                    Nursing                 PT                    OT Decreased caregiver support               SLP            SW  Decreased caregiver support Does not have 24 hr care per pt             Discharge Planning/Teaching Needs:    Home with tow son's who will not be there during the day-one works and other looking for a job.     Team Discussion:  Team evaluating pt and setting goals. Pt hopefully can be mod/i level goals to be safe at home alone while they are at work  Revisions to Treatment Plan:  New eval      Jacinta Penalver, Lemar Livings 10/02/2016, 3:01 PM           Patient ID: Josejuan Stagnaro, male   DOB: Oct 28, 1945, 71 y.o.   MRN: [id number]

## 2016-10-02 NOTE — Evaluation (Signed)
Occupational Therapy Assessment and Plan  Patient Details  Name: Magnum Lunde MRN: [id number] Date of Birth: Oct 14, 1945  OT Diagnosis: abnormal posture, hemiplegia affecting non-dominant side and muscle weakness (generalized) Rehab Potential: Rehab Potential (ACUTE ONLY): Good ELOS: 14 days   Today's Date: 10/02/2016 OT Individual Time: 0800-0900 OT Individual Time Calculation (min): 60 min     Problem List:  Patient Active Problem List   Diagnosis Date Noted  . Left sided cerebral hemisphere cerebrovascular accident (CVA) ([place]) 10/01/2016  . Right hemiparesis ([place]) 10/01/2016  . Acute CVA (cerebrovascular accident) ([place]) 09/30/2016  . Dyslipidemia 09/30/2016  . Hypomagnesemia 09/30/2016  . AKI (acute kidney injury) ([place]) 09/30/2016  . Hypokalemia 09/30/2016  . Fall at home, initial encounter 09/30/2016  . Ataxia 09/30/2016  . Essential hypertension   . UTI (urinary tract infection) 09/28/2016    Past Medical History:  Past Medical History:  Diagnosis Date  . Arthritis   . Hypertension   . Stroke [facility]) 2008   residual R sided weakness   Past Surgical History: No past surgical history on file.  Assessment & Plan Clinical Impression: Patient is a 71 y.o. year old male with recent admission to the hospital on 09/28/2016 after a recent fall 09/26/2016 without loss of consciousness while trying to get up from his chair as well as reported increased right sided weakness. Cranial CT scan with no acute changes. Lumbar spine films negative. MRI showed acute 1 cm infarction in the left hemisphere radiating white at her tracts posterior corona radiata. No hemorrhage or mass effect.  Patient transferred to CIR on 10/01/2016 .    Patient currently requires max with basic self-care skills secondary to muscle weakness, impaired timing and sequencing, abnormal tone, unbalanced muscle activation and decreased coordination, decreased problem solving and decreased memory and decreased sitting  balance, decreased standing balance, decreased postural control, hemiplegia and decreased balance strategies.  Prior to hospitalization, patient could complete ADLs with independent .  Patient will benefit from skilled intervention to decrease level of assist with basic self-care skills and increase independence with basic self-care skills prior to discharge home with care partner.  Anticipate patient will require 24 hour supervision and follow up home health.  OT - End of Session Activity Tolerance: Decreased this session;Tolerates 10 - 20 min activity with multiple rests Endurance Deficit: Yes Endurance Deficit Description: Pt fatigues with minimal activity. OT Assessment Rehab Potential (ACUTE ONLY): Good OT Barriers to Discharge: Decreased caregiver support OT Patient demonstrates impairments in the following area(s): Balance;Motor;Cognition;Endurance;Sensory OT Basic ADL's Functional Problem(s): Eating;Grooming;Bathing;Dressing;Toileting OT Advanced ADL's Functional Problem(s): Simple Meal Preparation OT Transfers Functional Problem(s): Toilet;Tub/Shower OT Additional Impairment(s): Fuctional Use of Upper Extremity OT Plan OT Intensity: Minimum of 1-2 x/day, 45 to 90 minutes OT Frequency: 5 out of 7 days OT Duration/Estimated Length of Stay: 14 days OT Treatment/Interventions: Balance/vestibular training;Cognitive remediation/compensation;Discharge planning;Self Care/advanced ADL retraining;Therapeutic Activities;Patient/family education;Therapeutic Exercise;UE/LE Strength taining/ROM;Neuromuscular re-education;DME/adaptive equipment instruction;Functional mobility training;UE/LE Coordination activities OT Self Feeding Anticipated Outcome(s): modified independent OT Basic Self-Care Anticipated Outcome(s): supervision  OT Toileting Anticipated Outcome(s): supervision OT Bathroom Transfers Anticipated Outcome(s): supervision OT Recommendation Patient destination: Home Follow Up  Recommendations: Home health OT;24 hour supervision/assistance Equipment Recommended: To be determined   Skilled Therapeutic Intervention Pt began working on selfcare retraining sit to stand on the EOB.  Pt with increased fear with sit to stand during session stating "I can't stand", but with re-assurance and support from therapist he attempted and only needed mod assist to complete.  Decreased ability to  use the RUE for washing the LUE, requiring mod assist from therapist.  Pt stating he usually does not need help with this at home.  Decreased ability to reach either foot for dressing tasks as well, with pt needing max assist to thread over his feet.  Total assist for TEDs and gripper socks.  Max assist to complete stand pivot transfers to the toilet with clothing management and hygiene.  Pt left in wheelchair at end of session with call button and phone in reach and safety belt in place.    OT Evaluation Precautions/Restrictions  Precautions Precautions: Fall Precaution Comments: R hemiparesis Restrictions Weight Bearing Restrictions: No  Pain Pain Assessment Pain Assessment: No/denies pain Home Living/Prior Functioning Home Living Living Arrangements: Children Available Help at Discharge: Family (Two sons one is unemployed the other works full time) Type of Home: Apartment Home Access: Level entry Home Layout: One level Bathroom Shower/Tub: Multimedia programmer: Handicapped height Additional Comments: lives in handicapped accessible apt with his 2 sons  Lives With: Family, Son (lives with 2 sons) IADL History Homemaking Responsibilities: Yes Meal Prep Responsibility: Secondary (would heat up foot in the microwave) Current License: No Education: worked as Therapist, 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 Occupation: Retired Prior Function Level of Independence: Pinon for independence (used rollator at home) ADL  See Function section of chart for  details  Vision Baseline Vision/History: Wears glasses Wears Glasses: Reading only Patient Visual Report: No change from baseline Vision Assessment?: Yes Eye Alignment: Within Functional Limits Ocular Range of Motion: Within Functional Limits Alignment/Gaze Preference: Within Defined Limits Tracking/Visual Pursuits: Able to track stimulus in all quads without difficulty Convergence: Within functional limits Visual Fields: No apparent deficits Perception  Perception: Within Functional Limits Praxis Praxis: Intact Cognition Overall Cognitive Status: Within Functional Limits for tasks assessed Arousal/Alertness: Awake/alert Orientation Level: Person;Place;Situation Person: Oriented Place: Oriented Situation: Oriented Year: 2018 Month: August Day of Week: Correct Memory: Impaired Memory Impairment: Decreased recall of new information Immediate Memory Recall: Sock;Blue;Bed Memory Recall: Sock;Blue;Bed Memory Recall Sock: Without Cue Memory Recall Blue: Without Cue Memory Recall Bed: With Cue Attention: Sustained Sustained Attention: Impaired Sustained Attention Impairment: Functional basic Awareness: Appears intact (able to state need for assistance with transfers secondary to weakness and fear of falling) Problem Solving: Impaired Problem Solving Impairment: Functional complex;Functional basic Comments: Pt reports his memory hasn't been as sharp since the first CVA. Sensation Sensation Light Touch: Appears Intact Stereognosis: Not tested Hot/Cold: Appears Intact Proprioception: Impaired Detail Proprioception Impaired Details: Impaired RUE Additional Comments: Pt with impaired proprioception in the right hand and wrist but intact at elbow and shoulder.   Coordination Gross Motor Movements are Fluid and Coordinated: No Fine Motor Movements are Fluid and Coordinated: No Coordination and Movement Description: Pt uses the RUE as an active assist with min facilitation for  bathing and dressing tasks.  History of weakness secondary to previous CVA but pt states its a little worse since this CVA. Motor  Motor Motor: Hemiplegia Motor - Skilled Clinical Observations: RUE and RLE moderate hemiparesis Mobility  Transfers Transfers: Sit to Stand;Stand to Sit Sit to Stand: 3: Mod assist;With upper extremity assist;From bed Stand to Sit: 3: Mod assist;With upper extremity assist;To bed  Trunk/Postural Assessment  Cervical Assessment Cervical Assessment: Exceptions to [facility] (slight cervical protraction and flexion) Thoracic Assessment Thoracic Assessment: Exceptions to [facility] (thoracic rounding ) Lumbar Assessment Lumbar Assessment: Exceptions to [facility] (posterior pelvic tilt) Postural Control Postural Control: Deficits on evaluation Postural Limitations:  Increased right lean in sitting and standing with increased trunk flexion and decreased hip extension as well.   Balance Balance Balance Assessed: Yes Static Sitting Balance Static Sitting - Balance Support: No upper extremity supported;Feet supported Static Sitting - Level of Assistance: 5: Stand by assistance Dynamic Sitting Balance Dynamic Sitting - Balance Support: During functional activity Dynamic Sitting - Level of Assistance: 4: Min assist Static Standing Balance Static Standing - Balance Support: During functional activity Static Standing - Level of Assistance: 4: Min assist Dynamic Standing Balance Dynamic Standing - Balance Support: During functional activity Dynamic Standing - Level of Assistance: 2: Max assist Extremity/Trunk Assessment RUE Assessment RUE Assessment: Exceptions to [facility] RUE Strength RUE Overall Strength Comments: Pt with prior hemiparesis but reports worsening this admission.  Brunnstrum stage V in the arm with increased tone noted in the elbow flexors, and digit flexors.  AROM shoulder flexion 0- 70 degrees in synergy pattern with compensations of shoulder hike present.  Increased  pain reported at the elbow as welll with shoulder flexion.  Demonstrates AROM elbow flexion 0-140 degrees with slower speed as well as extension 0-160 degrees.  Full gross grasp and released but unable to demonstrate tip to tip pinch for FM tasks.   LUE Assessment LUE Assessment: Within Functional Limits   See Function Navigator for Current Functional Status.   Refer to Care Plan for Long Term Goals  Recommendations for other services: None    Discharge Criteria: Patient will be discharged from OT if patient refuses treatment 3 consecutive times without medical reason, if treatment goals not met, if there is a change in medical status, if patient makes no progress towards goals or if patient is discharged from hospital.  The above assessment, treatment plan, treatment alternatives and goals were discussed and mutually agreed upon: by patient  Quanetta Truss OTR/L 10/02/2016, 12:55 PM

## 2016-10-02 NOTE — Progress Notes (Signed)
Social Work Assessment and Plan Social Work Assessment and Plan  Patient Details  Name: Robert Buckley MRN: [id number] Date of Birth: 14-May-1945  Today's Date: 10/02/2016  Problem List:  Patient Active Problem List   Diagnosis Date Noted  . Left sided cerebral hemisphere cerebrovascular accident (CVA) (HCC) 10/01/2016  . Right hemiparesis (HCC) 10/01/2016  . Acute CVA (cerebrovascular accident) (HCC) 09/30/2016  . Dyslipidemia 09/30/2016  . Hypomagnesemia 09/30/2016  . AKI (acute kidney injury) (HCC) 09/30/2016  . Hypokalemia 09/30/2016  . Fall at home, initial encounter 09/30/2016  . Ataxia 09/30/2016  . Essential hypertension   . UTI (urinary tract infection) 09/28/2016   Past Medical History:  Past Medical History:  Diagnosis Date  . Arthritis   . Hypertension   . Stroke [facility]) 2008   residual R sided weakness   Past Surgical History: No past surgical history on file. Social History:  reports that he has never smoked. He has never used smokeless tobacco. He reports that he does not drink alcohol or use drugs.  Family / Support Systems Marital Status: Single Patient Roles: Parent Children: Robert Buckley lives with pt and Robert Buckley [phone] Other Supports: Friends Anticipated Caregiver: Son's Ability/Limitations of Caregiver: Robert Buckley is not employed right now but is looking for a job and Robert Buckley works days at [employer]: Evenings only Family Dynamics: Close knit with his two son's, both are involved and supportive. They provide transportation for pt has since 2010. Pt does have a few friends but feels they will not assist him at home. Pt feels he has limited supports just he and his boys  Social History Preferred language: English Religion:  Cultural Background: No issues Education: High School Read: Yes Write: Yes Employment Status: Retired Fish farm manager Issues: No issues Guardian/Conservator: None-according to MD pt is capable  of making his own decisions while here.   Abuse/Neglect Physical Abuse: Denies Verbal Abuse: Denies Sexual Abuse: Denies Exploitation of patient/patient's resources: Denies Self-Neglect: Denies  Emotional Status Pt's affect, behavior adn adjustment status: Pt is motivated and feels he needs to get to mod/i level before going home. He is hopeful he will do well like he did last stroke in 2010. He voiced he had another stroke in between but was unaware of it. he was independent and taking care of himself and wants to again. Recent Psychosocial Issues: other health issues-past history of stroke Pyschiatric History: No history deferred depression screen due to seems to be adjusitng to this new unit well and able to verbalize his feelings and concerns. May benefit from seeing neuro-psych while here for coping. Substance Abuse History: No issues  Patient / Family Perceptions, Expectations & Goals Pt/Family understanding of illness & functional limitations: Pt can explain his stroke and deficits as a result of this. Son's have been here and will continue to provide support. All are hopeful he will do well and regain his independence. Pt talks with the MD and feels his questions are being answered. Premorbid pt/family roles/activities: Father, retiree, friend, etc Anticipated changes in roles/activities/participation: resume Pt/family expectations/goals: Pt states: " I need to be able to take care of myself by the time I leave here, my boys are not ther during the day."    [employer]: None Premorbid Home Care/DME Agencies: None Transportation available at discharge: Son's provide transportation and have since CVA in 2010 Resource referrals recommended: Neuropsychology, Support group (specify)  Discharge Planning Living Arrangements: Children Support Systems: Children, Friends/neighbors Type of Residence: Private residence [employer]: [employer],  Marine scientist (specify) Counselling psychologist) Financial Resources: Tree surgeon, Family Support Financial Screen Referred: No Living Expenses: Rent Money Management: Patient Does the patient have any problems obtaining your medications?: No Home Management: Son's and pt Patient/Family Preliminary Plans: Return to his apartment with his son's who both live there. Robert Buckley works during the day and Robert Buckley is unemployed but looking for a job and will not be there with him at discharge. Team evaluating him today and setting goals will discuss with them if realistic goals of mod/i. Sw Barriers to Discharge: Decreased caregiver support Sw Barriers to Discharge Comments: Does not have 24 hr care per pt Social Work Anticipated Follow Up Needs: HH/OP, Support Group  Clinical Impression Pleasant gentleman who is motivated to do well and recover from this stroke and get back to his independent level. He was ambulating with a rollator prior to admission due to past stroke. His two son's are supportive but will not be providing 24 hr care upon discharge. Will await therapist's eval and work on a safe discharge plan. May benefit from seeing neuro-psych while here. Will get input from team. Hopefully pt can reach mod/i level to return home safely.  Lucy Chris 10/02/2016, 9:27 AM

## 2016-10-02 NOTE — Progress Notes (Signed)
Occupational Therapy Session Note  Patient Details  Name: Robert Buckley MRN: [id number] Date of Birth: December 18, 1945  Today's Date: 10/02/2016 OT Individual Time: 1033-1101 OT Individual Time Calculation (min): 28 min    Short Term Goals: Week 1:  OT Short Term Goal 1 (Week 1): Pt will complete all bathing with min assist sit to stand.  OT Short Term Goal 2 (Week 1): Pt will complete LB dressing with mod assist for donning pants and gripper socks. OT Short Term Goal 3 (Week 1): Pt will complete toilet transfers stand pivot with min assist to the 3:1 using the RW.  OT Short Term Goal 4 (Week 1): Pt will demonstrate RUE use at a active assist level with supervision for washing the left arm during bathing tasks.   Skilled Therapeutic Interventions/Progress Updates:    Pt completed toilet transfers stand pivot to the [facility] during session with mod assist.  Mod demonstrational cueing for sequencing walker and LLE.  Increased left knee hyperextension secondary to decreased ability to maintain upright posture and left hip extension in standing as well.  Mod assist for managing clothing in standing with max assist for therapist to hold urinal while he focused on standing balance.  Finished session with transfer back to the wheelchair with mod assist.  Pt left at bedside with safety belt and call button in reach.    Therapy Documentation Precautions:  Precautions Precautions: Fall Precaution Comments: R hemiparesis Restrictions Weight Bearing Restrictions: No  Pain: Pain Assessment Pain Assessment: No/denies pain ADL: See Function Navigator for Current Functional Status.   Therapy/Group: Individual Therapy  Liston Thum OTR/L 10/02/2016, 5:29 PM

## 2016-10-02 NOTE — Evaluation (Signed)
Speech Language Pathology Assessment and Plan  Patient Details  Name: Robert Buckley MRN: [id number] Date of Birth: Apr 10, 1945  SLP Diagnosis: Cognitive Impairments  Rehab Potential: Good ELOS: 18-21 days     Today's Date: 10/02/2016 SLP Individual Time: 1305-1400 SLP Individual Time Calculation (min): 55 min   Problem List:  Patient Active Problem List   Diagnosis Date Noted  . Left sided cerebral hemisphere cerebrovascular accident (CVA) ([place]) 10/01/2016  . Right hemiparesis ([place]) 10/01/2016  . Acute CVA (cerebrovascular accident) ([place]) 09/30/2016  . Dyslipidemia 09/30/2016  . Hypomagnesemia 09/30/2016  . AKI (acute kidney injury) ([place]) 09/30/2016  . Hypokalemia 09/30/2016  . Fall at home, initial encounter 09/30/2016  . Ataxia 09/30/2016  . Essential hypertension   . UTI (urinary tract infection) 09/28/2016   Past Medical History:  Past Medical History:  Diagnosis Date  . Arthritis   . Hypertension   . Stroke [facility]) 2008   residual R sided weakness   Past Surgical History: No past surgical history on file.  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 Per chart review patient lives in a handicapped accessible apartment. Ambulated with a rolling walker modified independent prior to admission. He was able to shower and dress himself. He has 2 sons who assist with much of his household duties. Presented 09/28/2016 after a recent fall 09/26/2016 without loss of consciousness while trying to get up from his chair as well as reported increased right sided weakness. Cranial CT scan with no acute changes. Lumbar spine films negative. MRI showed acute 1 cm infarction in the left hemisphere radiating white at her tracts posterior corona radiata. No hemorrhage or mass effect. Tolerating a regular consistency  diet. Urine culture greater than 100,000 Escherichia coli maintained on Rocephin and changed to Keflex 09/11/2016. Blood culture showing no growth. Physical therapy evaluation completed with recommendations of physical medicine rehabilitation consult.  SLP evaluation completed on 10/02/2016 with the following results:  Pt presents with mild cognitive deficits characterized by decreased sustained attention to tasks, decreased emergent awareness of deficits, poor safety awareness, decreased recall of new information, and decreased functional problem solving.  As a result, pt would benefit from skilled ST while inpatient in order to maximize functional independence and reduce burden of care prior to discharge.  Anticipate that pt will need 24/7 supervision at discharge; however, given pt's previous level of function he may not required additional ST follow up after discharge.     Skilled Therapeutic Interventions          Cognitive-linguistic evaluation completed with results and recommendations reviewed with family. Pt needed mod assist verbal cues for recall of safety precautions as pt was requesting removal of quick release belt upon therapist's arrival.  Pt was also impulsive with mobility and needed mod-max assist verbal cues for safety and following commands.  Pt also needed mod assist verbal cues to problem solve use of call bell to convey needs to nursing.  Pt was left in wheelchair with sons at bedside.  All questions were answered to their satisfaction at this time.      SLP Assessment  Patient will need skilled [facility] during CIR admission    Recommendations  Patient destination: Home Follow up Recommendations: 24 hour supervision/assistance Equipment Recommended: None recommended by SLP    SLP Frequency 3 to 5 out of  7 days   SLP Duration  SLP Intensity  SLP Treatment/Interventions 18-21 days   Minumum of 1-2 x/day, 30 to 90 minutes  Cognitive  remediation/compensation;Cueing hierarchy;Functional tasks;Environmental controls;Internal/external aids;Patient/family education    Pain Pain Assessment Pain Assessment: No/denies pain  Prior Functioning Cognitive/Linguistic Baseline: Baseline deficits Baseline deficit details: history of CVA and baseline memory deficits Type of Home: Apartment  Lives With: Son Available Help at Discharge: Family Education: worked as Therapist, 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  Problem Solving Problem solving assist level: Solves basic 50 - 74% of the time/requires cueing 25 - 49% of the time  Memory Memory assist level: Recognizes or recalls 50 - 74% of the time/requires cueing 25 - 49% of the time   Short Term Goals: Week 1: SLP Short Term Goal 1 (Week 1): Pt will use external memory aids to facilitate recall of daily information with min assist verbal cues.  SLP Short Term Goal 2 (Week 1): Pt will complete basic, familiar tasks with min assist verbal cues for functional problem solving.  SLP Short Term Goal 3 (Week 1): Pt will sustain his attention to a basic, familiar task for 15 minutes with supervision verbal cues for redirection.   SLP Short Term Goal 4 (Week 1): Pt will return demonstration of at least 3 safety precautions during functional tasks with min assist.    Refer to Care Plan for Long Term Goals  Recommendations for other services: None   Discharge Criteria: Patient will be  discharged from SLP if patient refuses treatment 3 consecutive times without medical reason, if treatment goals not met, if there is a change in medical status, if patient makes no progress towards goals or if patient is discharged from hospital.  The above assessment, treatment plan, treatment alternatives and goals were discussed and mutually agreed upon: by patient  Emilio Math 10/02/2016, 4:22 PM

## 2016-10-02 NOTE — Evaluation (Addendum)
Physical Therapy Assessment and Plan  Patient Details  Name: Robert Buckley MRN: [id number] Date of Birth: 1946-02-10  PT Diagnosis: Abnormal posture, Difficulty walking, Hemiparesis dominant, Hypertonia, Impaired cognition and Muscle weakness Rehab Potential: Good ELOS: 2 weeks   Today's Date: 10/02/2016 PT Individual Time: 1400-1500 PT Individual Time Calculation (min): 60 min    Problem List:  Patient Active Problem List   Diagnosis Date Noted  . Left sided cerebral hemisphere cerebrovascular accident (CVA) ([place]) 10/01/2016  . Right hemiparesis ([place]) 10/01/2016  . Acute CVA (cerebrovascular accident) ([place]) 09/30/2016  . Dyslipidemia 09/30/2016  . Hypomagnesemia 09/30/2016  . AKI (acute kidney injury) ([place]) 09/30/2016  . Hypokalemia 09/30/2016  . Fall at home, initial encounter 09/30/2016  . Ataxia 09/30/2016  . Essential hypertension   . UTI (urinary tract infection) 09/28/2016    Past Medical History:  Past Medical History:  Diagnosis Date  . Arthritis   . Hypertension   . Stroke [facility]) 2008   residual R sided weakness   Past Surgical History: No past surgical history on file.  Assessment & Plan Clinical Impression: Patient is a 71 y.o. year old male with 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 Per chart review patient lives in a handicapped accessible apartment. Ambulated with a rolling walker modified independent prior to admission. He was able to shower and dress himself. He has 2 sons who assist with much of his household duties. Presented 09/28/2016 after a recent fall 09/26/2016 without loss of consciousness while trying to get up from his chair as well as reported increased right sided weakness. Cranial CT scan with no acute changes. Lumbar spine films negative. MRI showed acute 1 cm infarction in the left hemisphere radiating white at her tracts posterior corona radiata. No hemorrhage or mass  effect. Patient did not receive TPA.Carotid Dopplers with no ICA stenosis. Echocardiogram with ejection fraction of 60% grade 1 diastolic dysfunction.Marland KitchenMRA with no large vessel occlusion aneurysm or significant stenosis.Currently continues on Plavix for CVA prophylaxis. Subcutaneous Lovenox for DVT prophylaxis. Creatinine 1.66 on admission and no prior labs forcomparisonand latest creatinine 1.48. Tolerating a regular consistency diet. Urine culture greater than 100,000 Escherichia coli maintained on Rocephin and changed to Keflex 09/11/2016. Blood culture showing no growth. Physical therapy evaluation completed with recommendations of physical medicine rehabilitation consult. Patient transferred to CIR on 10/01/2016 .   Patient currently requires max with mobility secondary to muscle weakness and muscle joint tightness, impaired timing and sequencing, abnormal tone, decreased coordination and decreased motor planning, decreased attention to right and decreased standing balance, decreased postural control, hemiplegia and decreased balance strategies.  Prior to hospitalization, patient was modified independent  with mobility and lived with Family, Son in a [place].  Home access is  Level entry.  Patient will benefit from skilled PT intervention to maximize safe functional mobility, minimize fall risk and decrease caregiver burden for planned discharge home with 24 hour supervision and intermittent assist.  Anticipate patient will benefit from follow up [facility] at discharge.  PT - End of Session Activity Tolerance: Decreased this session;Tolerates 10 - 20 min activity with multiple rests Endurance Deficit: Yes Endurance Deficit Description: Pt fatigues with minimal activity PT Assessment Rehab Potential (ACUTE/IP ONLY): Good PT Barriers to Discharge: Decreased caregiver support PT Patient demonstrates impairments in the following area(s):  Balance;Behavior;Endurance;Motor;Nutrition;Pain;Perception;Safety;Sensory PT Transfers Functional Problem(s): Bed Mobility;Bed to Chair;Car;Furniture;Floor PT Locomotion Functional Problem(s): Ambulation;Wheelchair Mobility;Stairs PT Plan PT Intensity: Minimum of 1-2  x/day ,45 to 90 minutes PT Frequency: 5 out of 7 days PT Duration Estimated Length of Stay: 18-21 days PT Treatment/Interventions: Ambulation/gait training;Balance/vestibular training;Discharge planning;Community reintegration;Cognitive remediation/compensation;DME/adaptive equipment instruction;Functional electrical stimulation;Functional mobility training;Patient/family education;Splinting/orthotics;Therapeutic Exercise;Visual/perceptual remediation/compensation;UE/LE Coordination activities;Therapeutic Activities;Neuromuscular re-education;Pain management;Psychosocial support;Stair training;UE/LE Strength taining/ROM;Wheelchair propulsion/positioning PT Transfers Anticipated Outcome(s): supervision to min assist PT Locomotion Anticipated Outcome(s): min assist PT Recommendation Follow Up Recommendations: Home health PT Patient destination: Home Equipment Recommended: To be determined Equipment Details: Pt already has RW and rollator  Skilled Therapeutic Intervention Evaluation completed (see details above and below) with education on PT POC and goals and individual treatment initiated with focus on transfer training, ambulation training and w/c propulsion. PT performed transfer from w/c<>car with max assist using RW. Pt performed stand pivot transfers from <>mat with mod assist. Pt able to perform squat pivot transfer w/c<>mat mod assist. Pt performed sit<>stand with mod assist using RW, increased posterior lateral lean noted, verbal cues to correct. Worked on standing balance and postural control with mirror for feedback, pt unable to bring hips forward over feet and keeps his weight shifted posteriorly. Pt ambulated x 15 ft using a  L handrail, able to advance R LE with activation of hip flexors and knee extensors but no ankle activation for toe clearance. Pt worked on w/c propulsion using L UE and L LE, pt with difficulty using L LE to steer and requiring min assist. Pt left seated in w/c at end of session with needs in reach.    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 Comments: amb with RW mod I, able to shower/dress self (sons help  with transfer only); sons assist with much of household duties Cognition Overall Cognitive Status: Within Functional Limits for tasks assessed Arousal/Alertness: Awake/alert Orientation Level: Oriented X4 Attention: Sustained Sustained Attention: Impaired Sustained Attention Impairment: Functional basic Memory: Impaired Memory Impairment: Decreased recall of new information Awareness: Appears intact Problem Solving: Impaired Problem Solving Impairment: Functional complex;Functional basic Safety/Judgment: Impaired Comments: Pt reports his memory hasn't been as sharp since the first CVA. Sensation Sensation Light Touch: Appears Intact (B LEs) Stereognosis: Not tested Hot/Cold: Not tested Proprioception: Appears Intact (B LEs) Proprioception Impaired Details: Impaired RUE Additional Comments: Pt with impaired proprioception in the right hand and wrist but intact at  elbow and shoulder.   Coordination Gross Motor Movements are Fluid and Coordinated: No Fine Motor Movements are Fluid and Coordinated: No Coordination and Movement Description: Pt uses the RUE as an active assist with min facilitation for bathing and dressing tasks.  History of weakness secondary to previous CVA but pt states its a little worse since this CVA. Heel Shin Test: limited bilaterally Motor  Motor Motor: Hemiplegia Motor - Skilled Clinical Observations: RUE and RLE moderate hemiparesis  Trunk/Postural Assessment  Cervical Assessment Cervical Assessment: Exceptions to [facility] (slight cervical flexion and protraction) Thoracic Assessment Thoracic Assessment: Exceptions to [facility] (thoracic kyphosis) Lumbar Assessment Lumbar Assessment: Exceptions to [facility] (posterior pelvic tilt) Postural Control Postural Control: Deficits on evaluation Postural Limitations: Increased right lean in sitting and standing with  increased trunk flexion and decreased hip extension   Balance Balance Balance Assessed: Yes Static Sitting Balance Static Sitting - Balance Support: No upper extremity supported;Feet supported Static Sitting - Level of Assistance: 5: Stand by assistance Dynamic Sitting Balance Dynamic Sitting - Balance Support: During functional activity Dynamic Sitting - Level of Assistance: 4: Min assist Static Standing Balance Static Standing - Balance Support: During functional activity Static Standing - Level of Assistance: 3: Mod assist Dynamic Standing Balance Dynamic Standing - Balance Support: During functional activity Dynamic Standing - Level of Assistance: 2: Max assist Extremity Assessment  RLE Assessment RLE Assessment: Exceptions to [facility] (tight hamstrings, limited DF ROM to neutral) RLE Strength Right Hip Flexion: 2+/5 Right Hip Extension: 2+/5 Right Hip ABduction: 2-/5 Right Hip ADduction: 3/5 Right Knee Flexion: 2-/5 Right Knee Extension: 3-/5 Right Ankle Dorsiflexion:  0/5 Right Ankle Plantar Flexion: 1/5 RLE Tone RLE Tone: Hypertonic;Mild LLE Assessment LLE Assessment: Within Functional Limits   See Function Navigator for Current Functional Status.   Refer to Care Plan for Long Term Goals  Recommendations for other services: None   Discharge Criteria: Patient will be discharged from PT if patient refuses treatment 3 consecutive times without medical reason, if treatment goals not met, if there is a change in medical status, if patient makes no progress towards goals or if patient is discharged from hospital.  The above assessment, treatment plan, treatment alternatives and goals were discussed and mutually agreed upon: by patient  Netta Corrigan, PT, DPT 10/02/2016, 3:06 PM

## 2016-10-03 ENCOUNTER — Inpatient Hospital Stay (HOSPITAL_COMMUNITY): Payer: Medicare Other | Admitting: Speech Pathology

## 2016-10-03 ENCOUNTER — Inpatient Hospital Stay (HOSPITAL_COMMUNITY): Payer: Medicare Other | Admitting: Occupational Therapy

## 2016-10-03 ENCOUNTER — Inpatient Hospital Stay (HOSPITAL_COMMUNITY): Payer: Medicare Other | Admitting: Physical Therapy

## 2016-10-03 LAB — CULTURE, BLOOD (ROUTINE X 2): CULTURE: NO GROWTH

## 2016-10-03 NOTE — Progress Notes (Signed)
Occupational Therapy Session Note  Patient Details  Name: Robert Buckley MRN: [id number] Date of Birth: May 19, 1945  Today's Date: 10/03/2016 OT Individual Time: 1003-1102 OT Individual Time Calculation (min): 59 min    Short Term Goals: Week 1:  OT Short Term Goal 1 (Week 1): Pt will complete all bathing with min assist sit to stand.  OT Short Term Goal 2 (Week 1): Pt will complete LB dressing with mod assist for donning pants and gripper socks. OT Short Term Goal 3 (Week 1): Pt will complete toilet transfers stand pivot with min assist to the 3:1 using the RW.  OT Short Term Goal 4 (Week 1): Pt will demonstrate RUE use at a active assist level with supervision for washing the left arm during bathing tasks.   Skilled Therapeutic Interventions/Progress Updates:    Pt completed shower, grooming, and dressing tasks during session.  Mod assist for stand pivot transfer to the shower bench from the wheelchair.  Mod instructional cueing for sequencing bathing with min assist to integrate the LUE for washing the right arm.  Mod assist for sit to stand to wash and dry peri area as well.  Dressing completed following hemi techniques for donning brief and pants, having pt cross the RLE over the left knee and maintain with mod assist to thread items.  Mod assist for sit to stand and standing to pull brief and pants over hips.  He was able to complete grooming tasks with overall supervision, min assist for donning deodorant under the left arm using the RUE.  Finished session with pt in wheelchair with call button and phone in reach and safety belt in place.   Therapy Documentation Precautions:  Precautions Precautions: Fall Precaution Comments: R hemiparesis Restrictions Weight Bearing Restrictions: No  Pain: Pain Assessment Pain Assessment: No/denies pain ADL: See Function Navigator for Current Functional Status.   Therapy/Group: Individual Therapy  Rodricus Candelaria OTR/L 10/03/2016, 12:27 PM

## 2016-10-03 NOTE — Progress Notes (Signed)
Subjective/Complaints: No issues overnite Pt does not recall hx of kidney problems  ROS- no CP. SOB, N/V/D  Objective: Vital Signs: Blood pressure (!) 143/88, pulse 80, temperature 98.5 F (36.9 C), temperature source Oral, resp. rate 16, height _0  (1.702 m), weight 89.4 kg (197 lb 3.2 oz), SpO2 98 %. No results found. Results for orders placed or performed during the hospital encounter of 10/01/16 (from the past 72 hour(s))  CBC     Status: None   Collection Time: 10/01/16  2:51 PM  Result Value Ref Range   WBC 5.9 4.0 - 10.5 K/uL   RBC 4.76 4.22 - 5.81 MIL/uL   Hemoglobin 14.9 13.0 - 17.0 g/dL   HCT 42.5 39.0 - 52.0 %   MCV 89.3 78.0 - 100.0 fL   MCH 31.3 26.0 - 34.0 pg   MCHC 35.1 30.0 - 36.0 g/dL   RDW 13.8 11.5 - 15.5 %   Platelets 157 150 - 400 K/uL  Creatinine, serum     Status: Abnormal   Collection Time: 10/01/16  2:51 PM  Result Value Ref Range   Creatinine, Ser 1.76 (H) 0.61 - 1.24 mg/dL   GFR calc non Af Amer 37 (L) >60 mL/min   GFR calc Af Amer 43 (L) >60 mL/min    Comment: (NOTE) The eGFR has been calculated using the CKD EPI equation. This calculation has not been validated in all clinical situations. eGFR's persistently <60 mL/min signify possible Chronic Kidney Disease.   CBC WITH DIFFERENTIAL     Status: None   Collection Time: 10/02/16  4:45 AM  Result Value Ref Range   WBC 4.9 4.0 - 10.5 K/uL   RBC 4.66 4.22 - 5.81 MIL/uL   Hemoglobin 14.3 13.0 - 17.0 g/dL   HCT 41.0 39.0 - 52.0 %   MCV 88.0 78.0 - 100.0 fL   MCH 30.7 26.0 - 34.0 pg   MCHC 34.9 30.0 - 36.0 g/dL   RDW 13.5 11.5 - 15.5 %   Platelets 162 150 - 400 K/uL   Neutrophils Relative % 58 %   Neutro Abs 2.9 1.7 - 7.7 K/uL   Lymphocytes Relative 31 %   Lymphs Abs 1.5 0.7 - 4.0 K/uL   Monocytes Relative 8 %   Monocytes Absolute 0.4 0.1 - 1.0 K/uL   Eosinophils Relative 3 %   Eosinophils Absolute 0.1 0.0 - 0.7 K/uL   Basophils Relative 0 %   Basophils Absolute 0.0 0.0 - 0.1 K/uL   Comprehensive metabolic panel     Status: Abnormal   Collection Time: 10/02/16  4:45 AM  Result Value Ref Range   Sodium 143 135 - 145 mmol/L   Potassium 3.4 (L) 3.5 - 5.1 mmol/L   Chloride 113 (H) 101 - 111 mmol/L   CO2 22 22 - 32 mmol/L   Glucose, Bld 92 65 - 99 mg/dL   BUN 15 6 - 20 mg/dL   Creatinine, Ser 1.63 (H) 0.61 - 1.24 mg/dL   Calcium 8.5 (L) 8.9 - 10.3 mg/dL   Total Protein 6.2 (L) 6.5 - 8.1 g/dL   Albumin 3.2 (L) 3.5 - 5.0 g/dL   AST 20 15 - 41 U/L   ALT 20 17 - 63 U/L   Alkaline Phosphatase 61 38 - 126 U/L   Total Bilirubin 1.1 0.3 - 1.2 mg/dL   GFR calc non Af Amer 41 (L) >60 mL/min   GFR calc Af Amer 48 (L) >60 mL/min    Comment: (NOTE) The  eGFR has been calculated using the CKD EPI equation. This calculation has not been validated in all clinical situations. eGFR's persistently <60 mL/min signify possible Chronic Kidney Disease.    Anion gap 8 5 - 15     HEENT: normal Cardio: RRR and no murmur Resp: CTA B/L and unlabored GI: BS positive and NT, ND Extremity:  Pulses positive and No Edema Skin:   Intact Neuro: Alert/Oriented, Abnormal Motor 3-/5 R delt, bi, tri, grip , HF, KE, ADF and Abnormal FMC Ataxic/ dec FMC Musc/Skel:  Normal and Other no pain with UE or LE ROM Gen NAD   Assessment/Plan: 1. Functional deficits secondary to left corona radiata infarct which require 3+ hours per day of interdisciplinary therapy in a comprehensive inpatient rehab setting. Physiatrist is providing close team supervision and 24 hour management of active medical problems listed below. Physiatrist and rehab team continue to assess barriers to discharge/monitor patient progress toward functional and medical goals. FIM: Function - Bathing Position: Sitting EOB Body parts bathed by patient: Right arm, Chest, Abdomen, Front perineal area, Right upper leg, Left upper leg, Left lower leg Body parts bathed by helper: Right lower leg, Buttocks, Left arm, Back  Function- Upper  Body Dressing/Undressing What is the patient wearing?: Pull over shirt/dress Pull over shirt/dress - Perfomed by patient: Thread/unthread right sleeve, Thread/unthread left sleeve, Put head through opening Pull over shirt/dress - Perfomed by helper: Pull shirt over trunk Function - Lower Body Dressing/Undressing What is the patient wearing?: Pants, Non-skid slipper socks, Ted Hose Position: Sitting EOB Pants- Performed by helper: Thread/unthread right pants leg, Thread/unthread left pants leg, Pull pants up/down Non-skid slipper socks- Performed by helper: Don/doff right sock, Don/doff left sock TED Hose - Performed by helper: Don/doff right TED hose, Don/doff left TED hose  Function - Toileting Toileting steps completed by patient: Adjust clothing prior to toileting Toileting steps completed by helper: Performs perineal hygiene, Adjust clothing after toileting Toileting Assistive Devices: Grab bar or rail Assist level: Two helpers  Function - Air cabin crew transfer assistive device: Bedside commode, Walker Assist level to toilet: Moderate assist (Pt 50 - 74%/lift or lower) Assist level from toilet: Moderate assist (Pt 50 - 74%/lift or lower)  Function - Chair/bed transfer Chair/bed transfer method: Stand pivot Chair/bed transfer assist level: Moderate assist (Pt 50 - 74%/lift or lower) Chair/bed transfer assistive device: Armrests, Walker Chair/bed transfer details: Tactile cues for posture, Verbal cues for technique  Function - Locomotion: Wheelchair Will patient use wheelchair at discharge?: Yes Type: Manual Max wheelchair distance: 50 ft Assist Level: Touching or steadying assistance (Pt > 75%) Assist Level: Touching or steadying assistance (Pt > 75%) Wheel 150 feet activity did not occur: Safety/medical concerns Turns around,maneuvers to table,bed, and toilet,negotiates 3% grade,maneuvers on rugs and over doorsills: No Function - Locomotion: Ambulation Assistive  device: Rail in hallway Max distance: 15 ft Assist level: Maximal assist (Pt 25 - 49%) Assist level: Maximal assist (Pt 25 - 49%) Walk 50 feet with 2 turns activity did not occur: Safety/medical concerns Walk 150 feet activity did not occur: Safety/medical concerns Walk 10 feet on uneven surfaces activity did not occur: Safety/medical concerns  Function - Comprehension Comprehension: Auditory Comprehension assist level: Follows basic conversation/direction with extra time/assistive device  Function - Expression Expression: Verbal Expression assist level: Expresses basic needs/ideas: With extra time/assistive device  Function - Social Interaction Social Interaction assist level: Interacts appropriately 75 - 89% of the time - Needs redirection for appropriate language or to initiate interaction.  Function - Problem Solving Problem solving assist level: Solves basic 50 - 74% of the time/requires cueing 25 - 49% of the time  Function - Memory Memory assist level: Recognizes or recalls 50 - 74% of the time/requires cueing 25 - 49% of the time Patient normally able to recall (first 3 days only): Current season, That he or she is in a hospital Medical Problem List and Plan: 1. Right side weaknesssecondary to acute infarction left hemisphere/corona radiata. 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 DVT Prophylaxis/Anticoagulation: Subcutaneous Lovenox. Monitor for any bleeding episodes 3. Pain Management: Tylenol as needed 4. Mood: Provide emotional support 5. Neuropsych: This patient iscapable of making decisions on hisown behalf. 6. Skin/Wound Care: Routine skin checks 7. Fluids/Electrolytes/Nutrition: Routine I&O with follow-up chemistries upon admit 8.CKD 3 Follow-up chemistries  GFR stable in 40s 9.Escherichia coli urinary tract infection. Rocephin changed to Keflex 10/01/2016-afebrile 10.Hyperlipidemia. Lipitor 11.Hypertension. Monitor  with increased mobility- occ lability- measure am BP in sitting Vitals:   10/02/16 1500 10/03/16 0536  BP: (!) 153/101 (!) 143/88  Pulse: 94 80  Resp: 18 16  Temp: (!) 97.5 F (36.4 C) 98.5 F (36.9 C)  SpO2: 96% 98%   12.BPH. Proscar  -up out of bed to void when possible    LOS (Days) 2 A FACE TO FACE EVALUATION WAS PERFORMED  Rhonda Vangieson E 10/03/2016, 8:10 AM

## 2016-10-03 NOTE — Progress Notes (Signed)
Speech Language Pathology Daily Session Note  Patient Details  Name: Robert Buckley MRN: [id number] Date of Birth: 04-25-1945  Today's Date: 10/03/2016 SLP Individual Time: 0801-0856 SLP Individual Time Calculation (min): 55 min  Short Term Goals: Week 1: SLP Short Term Goal 1 (Week 1): Pt will use external memory aids to facilitate recall of daily information with min assist verbal cues.  SLP Short Term Goal 2 (Week 1): Pt will complete basic, familiar tasks with min assist verbal cues for functional problem solving.  SLP Short Term Goal 3 (Week 1): Pt will sustain his attention to a basic, familiar task for 15 minutes with supervision verbal cues for redirection.   SLP Short Term Goal 4 (Week 1): Pt will return demonstration of at least 3 safety precautions during functional tasks with min assist.    Skilled Therapeutic Interventions:  Pt was seen for skilled ST targeting cognitive goals.  Pt was less impulsive when transferring to wheelchair from bed in comparison to yesterday's evaluation; however, he needed mod encouragement for use of right upper and lower extremities prior to asking for help with components of task.  SLP administered the MoCA-basic for ongoing diagnostic treatment of cognitive function.  Pt scored 16 out of 30 on assessment with deficits most remarkable in the areas of reasoning, mental flexibility, delayed recall, and attention.  Pt was able to recall 2 out of 5 words independently on delayed recall subtest, which improved to 3 out of 5 with min assist and 5 out of 5 with mod assist/choice of three.  Pt demonstrated decreased task organization and planning during attention subtests, and often jumped around to locate targets rather than scanning from left to right in an organized manner.  This also lead to decreased recall of previously found targets.  Pt was left in wheelchair with call bell within reach and quick release belt donned for safety.  Continue per current plan of  care.       Function:  Eating Eating                 Cognition Comprehension Comprehension assist level: Follows basic conversation/direction with extra time/assistive device  Expression   Expression assist level: Expresses basic needs/ideas: With extra time/assistive device  Social Interaction Social Interaction assist level: Interacts appropriately 90% of the time - Needs monitoring or encouragement for participation or interaction.  Problem Solving Problem solving assist level: Solves basic 50 - 74% of the time/requires cueing 25 - 49% of the time  Memory Memory assist level: Recognizes or recalls 50 - 74% of the time/requires cueing 25 - 49% of the time    Pain Pain Assessment Pain Assessment: No/denies pain  Therapy/Group: Individual Therapy  Lanai Conlee, Melanee Spry 10/03/2016, 8:56 AM

## 2016-10-03 NOTE — Progress Notes (Signed)
Physical Therapy Session Note  Patient Details  Name: Robert Buckley MRN: [id number] Date of Birth: 1945/11/14  Today's Date: 10/03/2016 PT Individual Time: 6606-0045 PT Individual Time Calculation (min): 75 min   Short Term Goals: Week 1:  PT Short Term Goal 1 (Week 1): Pt will ambulate 50' Mod A w/ LRAD PT Short Term Goal 2 (Week 1): Pt will transfer bed<>chair w/ Min A  PT Short Term Goal 3 (Week 1): Pt will perform sit<>stand transfer w/ Min A PT Short Term Goal 4 (Week 1): Pt will maintain dynamic sitting balance w/ supervision  Skilled Therapeutic Interventions/Progress Updates:   Pt in w/c upon arrival and agreeable to therapy, no c/o pain.   Worked on functional mobility and endurance to OOB activity this session.  -Pt self-propelled w/c w/ L UE/LE Mod A for 50'. Verbal and tactile cues for technique and sequencing.  -Practiced stand pivot and lateral scoot transfers to both sides, Mod A to L side and Max A to R side. Verbal and visual cues for technique and sequencing. Decreased motor planning and initiation.  -Ambulated 25' w/ Mod A using RW and bilateral UE support.   NMR while seated at edge of mat w/ emphasis on L weight-shift and reaching outside of BOS w/ LUE.   NuStep at end of session 20 min @ L1 to work on functional LE strength and reciprocal movement pattern. Verbal cues to attend to task.   Assisted pt in donning new briefs after having incontinent episode, total A for pericare. Ended session in w/c, call bell within reach and all needs met.   Therapy Documentation Precautions:  Precautions Precautions: Fall Precaution Comments: R hemiparesis Restrictions Weight Bearing Restrictions: No Vital Signs: Therapy Vitals Temp: 99.1 F (37.3 C) Temp Source: Oral Pulse Rate: 76 Resp: 18 BP: 129/82 Patient Position (if appropriate): Sitting Oxygen Therapy SpO2: 99 % O2 Device: Not Delivered  See Function Navigator for Current Functional  Status.   Therapy/Group: Individual Therapy  Jessie Schrieber K Arnette 10/03/2016, 3:48 PM

## 2016-10-04 ENCOUNTER — Inpatient Hospital Stay (HOSPITAL_COMMUNITY): Payer: Medicare Other | Admitting: Occupational Therapy

## 2016-10-04 ENCOUNTER — Inpatient Hospital Stay (HOSPITAL_COMMUNITY): Payer: Medicare Other | Admitting: Speech Pathology

## 2016-10-04 ENCOUNTER — Inpatient Hospital Stay (HOSPITAL_COMMUNITY): Payer: Medicare Other | Admitting: Physical Therapy

## 2016-10-04 MED ORDER — AMLODIPINE BESYLATE 5 MG PO TABS
5.0000 mg | ORAL_TABLET | Freq: Every day | ORAL | Status: DC
  Administered 2016-10-04 – 2016-10-12 (×9): 5 mg via ORAL
  Filled 2016-10-04 (×9): qty 1

## 2016-10-04 NOTE — Progress Notes (Signed)
Subjective/Complaints:  Slept ok, no issues overnite ROS- no CP. SOB, N/V/D  Objective: Vital Signs: Blood pressure (!) 145/90, pulse 66, temperature 98.2 F (36.8 C), temperature source Oral, resp. rate 18, height '5\' 7"'  (1.702 m), weight 89.4 kg (197 lb 3.2 oz), SpO2 99 %. No results found. Results for orders placed or performed during the hospital encounter of 10/01/16 (from the past 72 hour(s))  CBC     Status: None   Collection Time: 10/01/16  2:51 PM  Result Value Ref Range   WBC 5.9 4.0 - 10.5 K/uL   RBC 4.76 4.22 - 5.81 MIL/uL   Hemoglobin 14.9 13.0 - 17.0 g/dL   HCT 42.5 39.0 - 52.0 %   MCV 89.3 78.0 - 100.0 fL   MCH 31.3 26.0 - 34.0 pg   MCHC 35.1 30.0 - 36.0 g/dL   RDW 13.8 11.5 - 15.5 %   Platelets 157 150 - 400 K/uL  Creatinine, serum     Status: Abnormal   Collection Time: 10/01/16  2:51 PM  Result Value Ref Range   Creatinine, Ser 1.76 (H) 0.61 - 1.24 mg/dL   GFR calc non Af Amer 37 (L) >60 mL/min   GFR calc Af Amer 43 (L) >60 mL/min    Comment: (NOTE) The eGFR has been calculated using the CKD EPI equation. This calculation has not been validated in all clinical situations. eGFR's persistently <60 mL/min signify possible Chronic Kidney Disease.   CBC WITH DIFFERENTIAL     Status: None   Collection Time: 10/02/16  4:45 AM  Result Value Ref Range   WBC 4.9 4.0 - 10.5 K/uL   RBC 4.66 4.22 - 5.81 MIL/uL   Hemoglobin 14.3 13.0 - 17.0 g/dL   HCT 41.0 39.0 - 52.0 %   MCV 88.0 78.0 - 100.0 fL   MCH 30.7 26.0 - 34.0 pg   MCHC 34.9 30.0 - 36.0 g/dL   RDW 13.5 11.5 - 15.5 %   Platelets 162 150 - 400 K/uL   Neutrophils Relative % 58 %   Neutro Abs 2.9 1.7 - 7.7 K/uL   Lymphocytes Relative 31 %   Lymphs Abs 1.5 0.7 - 4.0 K/uL   Monocytes Relative 8 %   Monocytes Absolute 0.4 0.1 - 1.0 K/uL   Eosinophils Relative 3 %   Eosinophils Absolute 0.1 0.0 - 0.7 K/uL   Basophils Relative 0 %   Basophils Absolute 0.0 0.0 - 0.1 K/uL  Comprehensive metabolic panel      Status: Abnormal   Collection Time: 10/02/16  4:45 AM  Result Value Ref Range   Sodium 143 135 - 145 mmol/L   Potassium 3.4 (L) 3.5 - 5.1 mmol/L   Chloride 113 (H) 101 - 111 mmol/L   CO2 22 22 - 32 mmol/L   Glucose, Bld 92 65 - 99 mg/dL   BUN 15 6 - 20 mg/dL   Creatinine, Ser 1.63 (H) 0.61 - 1.24 mg/dL   Calcium 8.5 (L) 8.9 - 10.3 mg/dL   Total Protein 6.2 (L) 6.5 - 8.1 g/dL   Albumin 3.2 (L) 3.5 - 5.0 g/dL   AST 20 15 - 41 U/L   ALT 20 17 - 63 U/L   Alkaline Phosphatase 61 38 - 126 U/L   Total Bilirubin 1.1 0.3 - 1.2 mg/dL   GFR calc non Af Amer 41 (L) >60 mL/min   GFR calc Af Amer 48 (L) >60 mL/min    Comment: (NOTE) The eGFR has been calculated using the  CKD EPI equation. This calculation has not been validated in all clinical situations. eGFR's persistently <60 mL/min signify possible Chronic Kidney Disease.    Anion gap 8 5 - 15     HEENT: normal Cardio: RRR and no murmur Resp: CTA B/L and unlabored GI: BS positive and NT, ND Extremity:  Pulses positive and No Edema Skin:   Intact Neuro: Alert/Oriented, Abnormal Motor 3-/5 R delt, bi, tri, grip , HF, KE, ADF and Abnormal FMC Ataxic/ dec FMC Musc/Skel:  Normal and Other no pain with UE or LE ROM Gen NAD   Assessment/Plan: 1. Functional deficits secondary to left corona radiata infarct which require 3+ hours per day of interdisciplinary therapy in a comprehensive inpatient rehab setting. Physiatrist is providing close team supervision and 24 hour management of active medical problems listed below. Physiatrist and rehab team continue to assess barriers to discharge/monitor patient progress toward functional and medical goals. FIM: Function - Bathing Position: Shower Body parts bathed by patient: Right arm, Chest, Abdomen, Front perineal area, Buttocks, Right upper leg, Left upper leg, Right lower leg, Left lower leg Body parts bathed by helper: Back, Left arm  Function- Upper Body Dressing/Undressing What is the  patient wearing?: Pull over shirt/dress Pull over shirt/dress - Perfomed by patient: Thread/unthread right sleeve, Thread/unthread left sleeve, Put head through opening, Pull shirt over trunk Pull over shirt/dress - Perfomed by helper: Pull shirt over trunk Assist Level: Set up Function - Lower Body Dressing/Undressing What is the patient wearing?: Pants, Ted Hose, Non-skid slipper socks Position: Wheelchair/chair at [employer]- Performed by patient: Thread/unthread left pants leg, Pull pants up/down Pants- Performed by helper: Thread/unthread right pants leg Non-skid slipper socks- Performed by patient: Don/doff right sock Non-skid slipper socks- Performed by helper: Don/doff left sock TED Hose - Performed by helper: Don/doff right TED hose, Don/doff left TED hose  Function - Toileting Toileting steps completed by patient: Adjust clothing prior to toileting Toileting steps completed by helper: Performs perineal hygiene, Adjust clothing after toileting Toileting Assistive Devices: Grab bar or rail Assist level: Two helpers  Function - Air cabin crew transfer assistive device: Bedside commode, Walker Assist level to toilet: Moderate assist (Pt 50 - 74%/lift or lower) Assist level from toilet: Moderate assist (Pt 50 - 74%/lift or lower) Assist level to bedside commode (at bedside): Moderate assist (Pt 50 - 74%/lift or lower) Assist level from bedside commode (at bedside): Moderate assist (Pt 50 - 74%/lift or lower)  Function - Chair/bed transfer Chair/bed transfer method: Stand pivot Chair/bed transfer assist level: Moderate assist (Pt 50 - 74%/lift or lower) Chair/bed transfer assistive device: Armrests Chair/bed transfer details: Verbal cues for sequencing, Verbal cues for technique  Function - Locomotion: Wheelchair Will patient use wheelchair at discharge?: Yes Type: Manual Max wheelchair distance: 50 ft Assist Level: Moderate assistance (Pt 50 - 74%) Assist Level:  Moderate assistance (Pt 50 - 74%) Wheel 150 feet activity did not occur: Safety/medical concerns Turns around,maneuvers to table,bed, and toilet,negotiates 3% grade,maneuvers on rugs and over doorsills: No Function - Locomotion: Ambulation Assistive device: Walker-rolling Max distance: 25' Assist level: Moderate assist (Pt 50 - 74%) Assist level: Moderate assist (Pt 50 - 74%) Walk 50 feet with 2 turns activity did not occur: Safety/medical concerns Walk 150 feet activity did not occur: Safety/medical concerns Walk 10 feet on uneven surfaces activity did not occur: Safety/medical concerns  Function - Comprehension Comprehension: Auditory Comprehension assist level: Follows basic conversation/direction with extra time/assistive device  Function - Expression Expression: Verbal Expression  assist level: Expresses basic needs/ideas: With extra time/assistive device  Function - Social Interaction Social Interaction assist level: Interacts appropriately 90% of the time - Needs monitoring or encouragement for participation or interaction.  Function - Problem Solving Problem solving assist level: Solves basic 50 - 74% of the time/requires cueing 25 - 49% of the time  Function - Memory Memory assist level: Recognizes or recalls 50 - 74% of the time/requires cueing 25 - 49% of the time Patient normally able to recall (first 3 days only): Current season, That he or she is in a hospital Medical Problem List and Plan: 1. Right side weaknesssecondary to acute infarction left hemisphere/corona radiata. 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 DVT Prophylaxis/Anticoagulation: Subcutaneous Lovenox. Monitor for any bleeding episodes 3. Pain Management: Tylenol as needed 4. Mood: Provide emotional support 5. Neuropsych: This patient iscapable of making decisions on hisown behalf. 6. Skin/Wound Care: Routine skin checks 7. Fluids/Electrolytes/Nutrition:  Routine I&O with follow-up chemistries upon admit 8.CKD 3 Follow-up chemistries  GFR stable in 40s 9.Escherichia coli urinary tract infection. Rocephin changed to Keflex 10/01/2016-afebrile finish 7 d course 10.Hyperlipidemia. Lipitor 11.Hypertension. Monitor with increased mobility- trending up restart norvasc 13m Vitals:   10/03/16 1352 10/04/16 0524  BP: 129/82 (!) 145/90  Pulse: 76 66  Resp: 18 18  Temp: 99.1 F (37.3 C) 98.2 F (36.8 C)  SpO2: 99% 99%   12.BPH. Proscar  -up out of bed to void when possible   13.  Mild hypokalemia- finished KCL will recheck BMET in am LOS (Days) 3LinnE 10/04/2016, 7:31 AM

## 2016-10-04 NOTE — Progress Notes (Signed)
Speech Language Pathology Daily Session Note  Patient Details  Name: Robert Buckley MRN: [id number] Date of Birth: 21-Jun-1945  Today's Date: 10/04/2016 SLP Individual Time: 1405-1503 SLP Individual Time Calculation (min): 58 min  Short Term Goals: Week 1: SLP Short Term Goal 1 (Week 1): Pt will use external memory aids to facilitate recall of daily information with min assist verbal cues.  SLP Short Term Goal 2 (Week 1): Pt will complete basic, familiar tasks with min assist verbal cues for functional problem solving.  SLP Short Term Goal 3 (Week 1): Pt will sustain his attention to a basic, familiar task for 15 minutes with supervision verbal cues for redirection.   SLP Short Term Goal 4 (Week 1): Pt will return demonstration of at least 3 safety precautions during functional tasks with min assist.    Skilled Therapeutic Interventions:  Pt was seen for skilled ST targeting cognitive goals.  SLP facilitated the session with money management tasks to address attention, problem solving, and memory in a functional, familiar context.  Pt needed max assist for working memory and [employer] forces operational [employer] when counting money.   Pt demonstrated good insight into task difficulty, stating that he was "slower" than usual.  Therapist also facilitated the session with a novel card game.  Pt was able to sustain his attention to task for its duration (~15 minutes) with no cues needed for redirection.  He was able to plan and execute a problem solving strategy with min-mod assist verbal cues for working memory of task rules and procedures.  Pt was left in wheelchair with call bell within reach. Continue per current plan of care.    Function:  Eating Eating              Cognition Comprehension Comprehension assist level: Understands basic 90% of the time/cues < 10% of the time  Expression   Expression assist level: Expresses basic 75 - 89% of the time/requires cueing 10 - 24% of the time. Needs helper to  occlude trach/needs to repeat words.  Social Interaction Social Interaction assist level: Interacts appropriately 90% of the time - Needs monitoring or encouragement for participation or interaction.  Problem Solving Problem solving assist level: Solves basic 50 - 74% of the time/requires cueing 25 - 49% of the time  Memory Memory assist level: Recognizes or recalls 50 - 74% of the time/requires cueing 25 - 49% of the time    Pain Pain Assessment Pain Assessment: No/denies pain  Therapy/Group: Individual Therapy  Okla Qazi, Melanee Spry 10/04/2016, 3:05 PM

## 2016-10-04 NOTE — Progress Notes (Signed)
Speech Language Pathology Daily Session Note  Patient Details  Name: Robert Buckley MRN: [id number] Date of Birth: 07/01/1945  Today's Date: 10/04/2016 SLP Individual Time: 1030-1055 SLP Individual Time Calculation (min): 25 min  Short Term Goals: Week 1: SLP Short Term Goal 1 (Week 1): Pt will use external memory aids to facilitate recall of daily information with min assist verbal cues.  SLP Short Term Goal 2 (Week 1): Pt will complete basic, familiar tasks with min assist verbal cues for functional problem solving.  SLP Short Term Goal 3 (Week 1): Pt will sustain his attention to a basic, familiar task for 15 minutes with supervision verbal cues for redirection.   SLP Short Term Goal 4 (Week 1): Pt will return demonstration of at least 3 safety precautions during functional tasks with min assist.    Skilled Therapeutic Interventions: Skilled treatment session focused on cognitive goals. Patient independently recalled events from previous therapy session, the clinician's name and proper compensatory dressing techniques. Patient also demonstrated intellectual awareness of his physical deficits and independently reported he is not allowed to get up without assistance and showed this clinician the correct button on his call bell to utilize for assistance. Patient demonstrated selective attention to task for ~15 minutes with Mod I in a mildly distracting environment. Patient left upright in wheelchair with quick release belt in place and all needs within reach. Continue with current plan of care.      Function:  Cognition Comprehension Comprehension assist level: Understands basic 90% of the time/cues < 10% of the time  Expression   Expression assist level: Expresses basic 75 - 89% of the time/requires cueing 10 - 24% of the time. Needs helper to occlude trach/needs to repeat words.  Social Interaction Social Interaction assist level: Interacts appropriately 90% of the time - Needs monitoring  or encouragement for participation or interaction.  Problem Solving Problem solving assist level: Solves basic 50 - 74% of the time/requires cueing 25 - 49% of the time  Memory Memory assist level: Recognizes or recalls 75 - 89% of the time/requires cueing 10 - 24% of the time    Pain Pain Assessment Pain Assessment: No/denies pain  Therapy/Group: Individual Therapy  Dimond Crotty 10/04/2016, 12:43 PM

## 2016-10-04 NOTE — Progress Notes (Signed)
Occupational Therapy Session Note  Patient Details  Name: Robert Buckley MRN: [id number] Date of Birth: [birth year]  Today's Date: 10/04/2016 OT Individual Time: 0901-1001 OT Individual Time Calculation (min): 60 min    Short Term Goals: Week 1:  OT Short Term Goal 1 (Week 1): Pt will complete all bathing with min assist sit to stand.  OT Short Term Goal 2 (Week 1): Pt will complete LB dressing with mod assist for donning pants and gripper socks. OT Short Term Goal 3 (Week 1): Pt will complete toilet transfers stand pivot with min assist to the 3:1 using the RW.  OT Short Term Goal 4 (Week 1): Pt will demonstrate RUE use at a active assist level with supervision for washing the left arm during bathing tasks.   Skilled Therapeutic Interventions/Progress Updates:    Pt completed shower and dressing sit to stand during session.  Mod assist for ambulation from the wheelchair at bedside to the shower bench with use of the RW.  Decreased ability to advance the RLE efficiently during mobility with external rotation also present when stepping, causing him to walk more sideways then forward.  Once on the shower seat he completed bathing sit to stand with min assist.  Mod instructional cueing for sequencing and thoroughness.  Dressing completed from wheelchair level at the sink.  He needed min assist to cross the RLE over the left knee and maintain for donning brief and pants, but was able to keep it crossed on his own to donn his gripper sock after therapist had assisted with TEDs.  Finished session with pt in wheelchair at end of session and call button and phone in reach.    Therapy Documentation Precautions:  Precautions Precautions: Fall Precaution Comments: R hemiparesis Restrictions Weight Bearing Restrictions: No  Pain: Pain Assessment Pain Assessment: No/denies pain ADL: See Function Navigator for Current Functional Status.   Therapy/Group: Individual Therapy  Genova Kiner  OTR/L 10/04/2016, 12:25 PM

## 2016-10-04 NOTE — Progress Notes (Signed)
Physical Therapy Session Note  Patient Details  Name: Robert Buckley MRN: [id number] Date of Birth: July 17, 1945  Today's Date: 10/04/2016 PT Individual Time: 1100-1156 PT Individual Time Calculation (min): 56 min   Short Term Goals: Week 1:  PT Short Term Goal 1 (Week 1): Pt will ambulate 50' Mod A w/ LRAD PT Short Term Goal 2 (Week 1): Pt will transfer bed<>chair w/ Min A  PT Short Term Goal 3 (Week 1): Pt will perform sit<>stand transfer w/ Min A PT Short Term Goal 4 (Week 1): Pt will maintain dynamic sitting balance w/ supervision  Skilled Therapeutic Interventions/Progress Updates:   Pt in w/c upon arrival and agreeable to therapy, no c/o pain. Worked on [employer] and functional mobility this session. NMR in standing and seated w/ emphasis on R weight-bearing w/ unilateral UE support. Verbal cues to increase trunk extension in stance and tactile cues for R weight-shift. Min A to maintain balance in stance. NMR in seated on physioball w/ Mod A to practice trunk control w/ perturbations in all directions. Practiced performing sit<>stands w/ LUE over RLE/UE w/ Min guard to supervision. Practiced w/c<>bed transfers w/ Mod A to go towards R side. Gait w/ RW 25' w/ Mod A, verbal and manual cues for gait pattern and to increase R forward trunk rotation and weight-bearing. Ended session in w/c, call bell within reach and pink safety belt on, all needs met.   Therapy Documentation Precautions:  Precautions Precautions: Fall Precaution Comments: R hemiparesis Restrictions Weight Bearing Restrictions: No  See Function Navigator for Current Functional Status.   Therapy/Group: Individual Therapy  Demon Volante K Arnette 10/04/2016, 11:57 AM

## 2016-10-04 NOTE — IPOC Note (Signed)
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) (HCC) Active Problems:   Essential hypertension   Right hemiparesis (HCC)     Functional Problem List: Nursing Bladder, Bowel, Endurance, Medication Management, Nutrition, Pain, Safety, Motor, Skin Integrity  PT Balance, Behavior, Endurance, Motor, Nutrition, Pain, Perception, Safety, Sensory  OT Balance, Motor, Cognition, Endurance, Sensory  SLP Cognition  TR         Basic ADL's: OT Eating, Grooming, Bathing, Dressing, Toileting     Advanced  ADL's: OT Simple Meal Preparation     Transfers: PT Bed Mobility, Bed to Chair, Car, Furniture, Floor  OT Toilet, Research scientist (life sciences): PT Ambulation, Psychologist, prison and probation services, Stairs     Additional Impairments: OT Fuctional Use of Upper Extremity  SLP Social Cognition   Problem Solving, Memory, Attention, Awareness  TR      Anticipated Outcomes Item Anticipated Outcome  Self Feeding modified independent  Swallowing      Basic self-care  supervision   Toileting  supervision   Bathroom Transfers supervision  Bowel/Bladder  patient will be continent of bowel and bladder with min assist  Transfers  supervision to min assist  Locomotion  min assist  Communication     Cognition  supervision   Pain  pain will be equal to or less than 4/10 with min assist  Safety/Judgment  Patient will be free from falls/injury and displaying sound safety judgement with min assist   Therapy Plan: PT Intensity: Minimum of 1-2 x/day ,45 to 90 minutes PT Frequency: 5 out of 7 days PT Duration Estimated Length of Stay: 2 weeks OT Intensity: Minimum of 1-2 x/day, 45 to 90 minutes OT Frequency: 5 out of 7 days OT Duration/Estimated Length of Stay: 14 days SLP Intensity: Minumum of 1-2 x/day, 30 to 90  minutes SLP Frequency: 3 to 5 out of 7 days SLP Duration/Estimated Length of Stay: 18-21 days     Team Interventions: Nursing Interventions Patient/Family Education, Bladder Management, Bowel Management, Disease Management/Prevention, Pain Management, Medication Management, Skin Care/Wound Management, Cognitive Remediation/Compensation, Discharge Planning  PT interventions Ambulation/gait training, Balance/vestibular training, Discharge planning, Community reintegration, Cognitive remediation/compensation, DME/adaptive equipment instruction, Functional electrical stimulation, Functional mobility training, Patient/family education, Splinting/orthotics, Therapeutic Exercise, Visual/perceptual remediation/compensation, UE/LE Coordination activities, Therapeutic Activities, Neuromuscular re-education, Pain management, Psychosocial support, Stair training, UE/LE Strength taining/ROM, Wheelchair propulsion/positioning  OT Interventions Warden/ranger, Cognitive remediation/compensation, Discharge planning, Self Care/advanced ADL retraining, Therapeutic Activities, Patient/family education, Therapeutic Exercise, UE/LE Strength taining/ROM, Neuromuscular re-education, DME/adaptive equipment instruction, Functional mobility training, UE/LE Coordination activities  SLP Interventions Cognitive remediation/compensation, Cueing hierarchy, Functional tasks, Environmental controls, Internal/external aids, Patient/family education  TR Interventions    SW/CM Interventions Discharge Planning, Psychosocial Support, Patient/Family Education   Barriers to Discharge MD  Medical stability, Home enviroment access/loayout and Lack of/limited family support  Nursing      PT Decreased caregiver support    OT Decreased caregiver support    SLP      SW Decreased caregiver support Does not have 24 hr care per pt   Team Discharge Planning: Destination: PT-Home ,OT- Home , SLP-Home Projected Follow-up:  PT-Home health PT, OT-  Home health OT, 24 hour supervision/assistance, SLP-24 hour supervision/assistance Projected Equipment Needs: PT-To be determined, OT- To be determined, SLP-None recommended by SLP Equipment Details: PT-Pt already has RW and rollator, OT-  Patient/family involved in discharge planning: PT-  Patient, Family member/caregiver,  OT-Patient, SLP-Patient  MD ELOS: 14-18d Medical Rehab Prognosis:  Excellent Assessment:  71 year old right-handed male with 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 Per chart review patient lives in a handicapped accessible apartment. Ambulated with a rolling walker modified independent prior to admission. He was able to shower and dress himself. He has 2 sons who assist with much of his household duties. Presented 09/28/2016 after a recent fall 09/26/2016 without loss of consciousness while trying to get up from his chair as well as reported increased right sided weakness. Cranial CT scan with no acute changes. Lumbar spine films negative. MRI showed acute 1 cm infarction in the left hemisphere radiating white at her tracts posterior corona radiata. No hemorrhage or mass effect. Patient did not receive TPA.Carotid Dopplers with no ICA stenosis. Echocardiogram with ejection fraction of 60% grade 1 diastolic dysfunction.Marland KitchenMRA with no large vessel occlusion aneurysm or significant stenosis.Currently continues on Plavix for CVA prophylaxis   Now requiring 24/7 Rehab RN,MD, as well as CIR level PT, OT and SLP.  Treatment team will focus on ADLs and mobility with goals set at Sup See Team Conference Notes for weekly updates to the plan of care

## 2016-10-05 ENCOUNTER — Inpatient Hospital Stay (HOSPITAL_COMMUNITY): Payer: Medicare Other

## 2016-10-05 ENCOUNTER — Inpatient Hospital Stay (HOSPITAL_COMMUNITY): Payer: Medicare Other | Admitting: Speech Pathology

## 2016-10-05 ENCOUNTER — Encounter (HOSPITAL_COMMUNITY): Payer: Self-pay

## 2016-10-05 ENCOUNTER — Inpatient Hospital Stay (HOSPITAL_COMMUNITY): Payer: Medicare Other | Admitting: Physical Therapy

## 2016-10-05 DIAGNOSIS — E785 Hyperlipidemia, unspecified: Secondary | ICD-10-CM

## 2016-10-05 DIAGNOSIS — I639 Cerebral infarction, unspecified: Secondary | ICD-10-CM

## 2016-10-05 DIAGNOSIS — G8191 Hemiplegia, unspecified affecting right dominant side: Secondary | ICD-10-CM

## 2016-10-05 DIAGNOSIS — I10 Essential (primary) hypertension: Secondary | ICD-10-CM

## 2016-10-05 LAB — BASIC METABOLIC PANEL
ANION GAP: 7 (ref 5–15)
BUN: 24 mg/dL — ABNORMAL HIGH (ref 6–20)
CALCIUM: 8.7 mg/dL — AB (ref 8.9–10.3)
CHLORIDE: 114 mmol/L — AB (ref 101–111)
CO2: 20 mmol/L — AB (ref 22–32)
Creatinine, Ser: 1.81 mg/dL — ABNORMAL HIGH (ref 0.61–1.24)
GFR calc non Af Amer: 36 mL/min — ABNORMAL LOW (ref >60)
GFR, EST AFRICAN AMERICAN: 42 mL/min — AB (ref >60)
GLUCOSE: 101 mg/dL — AB (ref 65–99)
Potassium: 4.3 mmol/L (ref 3.5–5.1)
Sodium: 141 mmol/L (ref 135–145)

## 2016-10-05 NOTE — Progress Notes (Signed)
Robert Buckley is a 71 y.o. male [birth year] [id number]  Subjective: No new complaints. No new problems. Slept well. Feeling OK.  Objective: Vital signs in last 24 hours: Temp:  [97.9 F (36.6 C)] 97.9 F (36.6 C) (08/25 0420) Pulse Rate:  [63-70] 63 (08/25 0420) Resp:  [18] 18 (08/25 0420) BP: (122-134)/(74-75) 134/75 (08/25 0420) SpO2:  [95 %] 95 % (08/25 0420) Weight change:  Last BM Date: 10/04/16  Intake/Output from previous day: 08/24 0701 - 08/25 0700 In: 480 [P.O.:480] Out: 400 [Urine:400] Last cbgs: CBG (last 3)  No results for input(s): GLUCAP in the last 72 hours.   Physical Exam General: No apparent distress   HEENT: not dry Lungs: Normal effort. Lungs clear to auscultation, no crackles or wheezes. Cardiovascular: Regular rate and rhythm, no edema Abdomen: S/NT/ND; BS(+) Musculoskeletal:  unchanged Neurological: No new neurological deficits Wounds: N/A    Skin: clear  Aging changes Mental state: Alert, oriented, cooperative    Lab Results: BMET    Component Value Date/Time   NA 141 10/05/2016 0607   K 4.3 10/05/2016 0607   CL 114 (H) 10/05/2016 0607   CO2 20 (L) 10/05/2016 0607   GLUCOSE 101 (H) 10/05/2016 0607   BUN 24 (H) 10/05/2016 0607   CREATININE 1.81 (H) 10/05/2016 0607   CALCIUM 8.7 (L) 10/05/2016 0607   GFRNONAA 36 (L) 10/05/2016 0607   GFRAA 42 (L) 10/05/2016 0607   CBC    Component Value Date/Time   WBC 4.9 10/02/2016 0445   RBC 4.66 10/02/2016 0445   HGB 14.3 10/02/2016 0445   HCT 41.0 10/02/2016 0445   PLT 162 10/02/2016 0445   MCV 88.0 10/02/2016 0445   MCH 30.7 10/02/2016 0445   MCHC 34.9 10/02/2016 0445   RDW 13.5 10/02/2016 0445   LYMPHSABS 1.5 10/02/2016 0445   MONOABS 0.4 10/02/2016 0445   EOSABS 0.1 10/02/2016 0445   BASOSABS 0.0 10/02/2016 0445    Studies/Results: No results found.  Medications: I have reviewed the patient's current medications.  Assessment/Plan:  1. R hemiparesis due to L hemisphere CVA  - CIR 2. DVT proph - Lovenox 3. CKD 3: monitor labs 4. UT_ - Keflex 5. Dyslipidemia - Lipitor 6. HTN: Norvasc 7. BPH - Proscar   Length of stay, days: 4  Sonda Primes , MD 10/05/2016, 12:57 PM

## 2016-10-05 NOTE — Progress Notes (Signed)
Physical Therapy Session Note  Patient Details  Name: Robert Buckley MRN: [id number] Date of Birth: [birth year]  Today's Date: 10/05/2016 PT Individual Time: 1000-1054 PT Individual Time Calculation (min): 54 min   Short Term Goals: Week 1:  PT Short Term Goal 1 (Week 1): Pt will ambulate 50' Mod A w/ LRAD PT Short Term Goal 2 (Week 1): Pt will transfer bed<>chair w/ Min A  PT Short Term Goal 3 (Week 1): Pt will perform sit<>stand transfer w/ Min A PT Short Term Goal 4 (Week 1): Pt will maintain dynamic sitting balance w/ supervision  Skilled Therapeutic Interventions/Progress Updates:   Pt in w/c upon arrival and agreeable to therapy, no c/o pain.   Gait training in Lite gait @ 0.3 mph, 0% incline. Manual cues for R foot placement, step length, and knee extension in stance w/ emphasis on preventing extension thrust. Verbal cues to take R and L steps as well as to increase trunk extension. Pt ambulates w/ forward flexed posture.   Pt soiled pants at this time and taken back to room to change. Worked on standing tolerance and static standing balance while PT assisted in changing briefs and pants. Pt tolerated 30-60 sec of static standing at a time w/ close supervision and verbal cues to increase trunk extension in stance.   Ended session in recliner, call bell within reach and all needs met.   Therapy Documentation Precautions:  Precautions Precautions: Fall Precaution Comments: R hemiparesis Restrictions Weight Bearing Restrictions: No Pain: Pain Assessment Pain Assessment: No/denies pain  See Function Navigator for Current Functional Status.   Therapy/Group: Individual Therapy  [name] 10/05/2016, 10:55 AM

## 2016-10-05 NOTE — Progress Notes (Signed)
Physical Therapy Note  Patient Details  Name: Geramiah Rennaker MRN: [id number] Date of Birth: 1945/04/29 Today's Date: 10/05/2016  1430-1450  20 min individual tx Pain: none per pt  Seated /nmr via multimodal cues for R knee flex/ext, ankle PF. Hip protraction for scooting bilaterally to front of recliner. Pt left resting in recliner with all needs within reach.  [id number], 15 min individual tx Pain: none per pt  Seated neuro re-ed via multimodal cues, visual feedback for reciprocal scooting for pelvic activation, forward wt shift to facilitate sit>< stand.  Sit > stand from recliner with min assist to stand with bil UE support on chair back in front of pt, for 10 x 1 R><L lateral wt shift, trunk and hip extension for upright posture and forward gaze, and mini squats for R hip/knee stance control.  Pt left resting in recliner with all needs within reach.  See function navigator for current status.   Sofia Jaquith 10/05/2016, 8:13 AM

## 2016-10-05 NOTE — Progress Notes (Signed)
Speech Language Pathology Daily Session Note  Patient Details  Name: Robert Buckley MRN: [id number] Date of Birth: 05-Dec-1945  Today's Date: 10/05/2016 SLP Individual Time: 0915-1000 SLP Individual Time Calculation (min): 45 min  Short Term Goals: Week 1: SLP Short Term Goal 1 (Week 1): Pt will use external memory aids to facilitate recall of daily information with min assist verbal cues.  SLP Short Term Goal 2 (Week 1): Pt will complete basic, familiar tasks with min assist verbal cues for functional problem solving.  SLP Short Term Goal 3 (Week 1): Pt will sustain his attention to a basic, familiar task for 15 minutes with supervision verbal cues for redirection.   SLP Short Term Goal 4 (Week 1): Pt will return demonstration of at least 3 safety precautions during functional tasks with min assist.    Skilled Therapeutic Interventions: Skilled treatment session focuse don cognition goals. SLP faciltiated session by providing question cues to recall previous therapy session. Pt able to recall 2 activites in previous morning's session. Pt with decreased safety awareness as he asked SLP to take off his safety belt so that he "could walk around room." Pt able to demonstrate selective attention for ~ 30 minutes in moderately distracting environment. Pt required Min A verbal and visual cues to complete mildly complex visual peg design task and mildly complex money management task. Pt was returned to room, left upright in wheelchair with safey belt donned and all needs within reach. Continue per current plan of care.       Function:  Eating Eating   Modified Consistency Diet: No Eating Assist Level: Set up assist for   Eating Set Up Assist For: Opening containers       Cognition Comprehension Comprehension assist level: Follows basic conversation/direction with extra time/assistive device;Understands basic 90% of the time/cues < 10% of the time  Expression   Expression assist level:  Expresses basic needs/ideas: With extra time/assistive device;Expresses basic 90% of the time/requires cueing < 10% of the time.  Social Interaction Social Interaction assist level: Interacts appropriately 75 - 89% of the time - Needs redirection for appropriate language or to initiate interaction.  Problem Solving Problem solving assist level: Solves basic 75 - 89% of the time/requires cueing 10 - 24% of the time  Memory Memory assist level: Recognizes or recalls 50 - 74% of the time/requires cueing 25 - 49% of the time;Recognizes or recalls 75 - 89% of the time/requires cueing 10 - 24% of the time    Pain Pain Assessment Pain Assessment: No/denies pain  Therapy/Group: Individual Therapy  Estell Puccini 10/05/2016, 10:35 AM

## 2016-10-05 NOTE — Progress Notes (Signed)
Occupational Therapy Session Note  Patient Details  Name: Robert Buckley MRN: [id number] Date of Birth: 1945/07/03  Today's Date: 10/05/2016 OT Individual Time: 0700-0755 OT Individual Time Calculation (min): 55 min    Short Term Goals: Week 1:  OT Short Term Goal 1 (Week 1): Pt will complete all bathing with min assist sit to stand.  OT Short Term Goal 2 (Week 1): Pt will complete LB dressing with mod assist for donning pants and gripper socks. OT Short Term Goal 3 (Week 1): Pt will complete toilet transfers stand pivot with min assist to the 3:1 using the RW.  OT Short Term Goal 4 (Week 1): Pt will demonstrate RUE use at a active assist level with supervision for washing the left arm during bathing tasks.   Skilled Therapeutic Interventions/Progress Updates:    1;1. No c/o pain. Pt supine>EOB with MOD A for trunk elevation. Pt stand pivot transfer with RW with MOD A for lifting and VC for safety awareness. W/c set up at sink without arm rests/ Pt leaning R requiring Vc to correct. Pt bathes at sit to stand level for washing back and buttocks pt declines washing feet. Pt dons pull over shirt with Vc for orientation as pt does not realize putting on backwards. Pt able to recall hemi techniques for LB dressing threading RLE first by bringing LE into figure 4. Pt able to advance pants past hips with MOD A for standing balance. OT dons ted hose and pt able to don B socks. Pt require A to don R shoe, but pt able to fasten B shoes with VC to bring LE into figure 4. Throughout session pt required VC for use of RUE to incorporate into functional tasks.Exited session with pt eating breakfast in w/c with call light in reach and QRB donned.   Therapy Documentation Precautions:  Precautions Precautions: Fall Precaution Comments: R hemiparesis Restrictions Weight Bearing Restrictions: No General:   Vital Signs: Therapy Vitals Temp: 97.9 F (36.6 C) Temp Source: Oral Pulse Rate: 63 Resp: 18 BP:  134/75 Patient Position (if appropriate): Lying Oxygen Therapy SpO2: 95 % O2 Device: Not Delivered  See Function Navigator for Current Functional Status.   Therapy/Group: Individual Therapy  Shon Hale 10/05/2016, 7:31 AM

## 2016-10-06 ENCOUNTER — Inpatient Hospital Stay (HOSPITAL_COMMUNITY): Payer: Medicare Other

## 2016-10-06 ENCOUNTER — Inpatient Hospital Stay (HOSPITAL_COMMUNITY): Payer: Medicare Other | Admitting: *Deleted

## 2016-10-06 NOTE — Progress Notes (Signed)
Physical Therapy Session Note  Patient Details  Name: Robert Buckley MRN: [id number] Date of Birth: 1945/04/26  Today's Date: 10/06/2016 PT Individual Time: 1455-1540 PT Individual Time Calculation (min): 45 min   Short Term Goals: Week 1:  PT Short Term Goal 1 (Week 1): Pt will ambulate 50' Mod A w/ LRAD PT Short Term Goal 2 (Week 1): Pt will transfer bed<>chair w/ Min A  PT Short Term Goal 3 (Week 1): Pt will perform sit<>stand transfer w/ Min A PT Short Term Goal 4 (Week 1): Pt will maintain dynamic sitting balance w/ supervision  Skilled Therapeutic Interventions/Progress Updates:  Pt up in recliner, unaware of urinary incontinence.  Sit<>stand at sink x2 with Min A for steadyign and cues for technique.  Pt stood 2x28min with multi-modal cues for posture and upward gaz in mirror, RLE ext in stance.  Gait in controlled setting x25' with up to Mod A for correct gait cycle and timing, RLE management esp at knee. Pt unable to modify short steps, flexed hips, and decreased weight shift to R with facilitation.   Seated WC propulsion with bil LEs only for challenged coordination practice x25' with Mod A and cues for technique.     Seated neuro re-ed for reciprocal scooting for pelvic activation, forward wt shift to facilitate sit>< stand in // bars. Serial sit<>stands with focus on forced use RLE and neutral pelvic alignment in standing x8 with Min A.   Pt left up in [facility] with lap belt and all needs in reach.   Therapy Documentation Precautions:  Precautions Precautions: Fall Precaution Comments: R hemiparesis Restrictions Weight Bearing Restrictions: No Pain:none    See Function Navigator for Current Functional Status.   Therapy/Group: Individual Therapy  Triva Hueber Virl Cagey, PT, DPT  10/06/2016, 12:52 PM

## 2016-10-06 NOTE — Progress Notes (Signed)
Robert Buckley is a 71 y.o. male Dec 12, 1945 [id number]  Subjective: No new complaints or problems. Slept well. Feeling OK.  Objective: Vital signs in last 24 hours: Temp:  [97.4 F (36.3 C)-97.9 F (36.6 C)] 97.4 F (36.3 C) (08/26 0602) Pulse Rate:  [72-79] 72 (08/26 0602) Resp:  [18] 18 (08/26 0602) BP: (127)/(88-90) 127/90 (08/26 0602) SpO2:  [94 %-97 %] 97 % (08/26 0602) Weight change:  Last BM Date: 10/04/16  Intake/Output from previous day: 08/25 0701 - 08/26 0700 In: 240 [P.O.:240] Out: -  Last cbgs: CBG (last 3)  No results for input(s): GLUCAP in the last 72 hours.   Physical Exam General: No apparent distress   HEENT: not dry Lungs: Normal effort. Lungs clear to auscultation, no crackles or wheezes. Cardiovascular: Regular rate and rhythm, no edema Abdomen: S/NT/ND; BS(+) Musculoskeletal:  unchanged Neurological: No new neurological deficits Wounds: N/A    Skin: clear  Aging changes Mental state: Alert, oriented, cooperative    Lab Results: BMET    Component Value Date/Time   NA 141 10/05/2016 0607   K 4.3 10/05/2016 0607   CL 114 (H) 10/05/2016 0607   CO2 20 (L) 10/05/2016 0607   GLUCOSE 101 (H) 10/05/2016 0607   BUN 24 (H) 10/05/2016 0607   CREATININE 1.81 (H) 10/05/2016 0607   CALCIUM 8.7 (L) 10/05/2016 0607   GFRNONAA 36 (L) 10/05/2016 0607   GFRAA 42 (L) 10/05/2016 0607   CBC    Component Value Date/Time   WBC 4.9 10/02/2016 0445   RBC 4.66 10/02/2016 0445   HGB 14.3 10/02/2016 0445   HCT 41.0 10/02/2016 0445   PLT 162 10/02/2016 0445   MCV 88.0 10/02/2016 0445   MCH 30.7 10/02/2016 0445   MCHC 34.9 10/02/2016 0445   RDW 13.5 10/02/2016 0445   LYMPHSABS 1.5 10/02/2016 0445   MONOABS 0.4 10/02/2016 0445   EOSABS 0.1 10/02/2016 0445   BASOSABS 0.0 10/02/2016 0445    Studies/Results: No results found.  Medications: I have reviewed the patient's current medications.  Assessment/Plan:  1. 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 DVT proph - on sq Lovenox 3. CKD 3: monitor labs 4. UTI - Keflex po 5. Dyslipidemia - on po Lipitor 6. HTN: on po Norvasc 7. BPH - Cont w/Proscar     Length of stay, days: 5  Sonda Primes , MD 10/06/2016, 9:54 AM

## 2016-10-07 ENCOUNTER — Inpatient Hospital Stay (HOSPITAL_COMMUNITY): Payer: Medicare Other | Admitting: Speech Pathology

## 2016-10-07 ENCOUNTER — Inpatient Hospital Stay (HOSPITAL_COMMUNITY): Payer: Medicare Other | Admitting: Occupational Therapy

## 2016-10-07 ENCOUNTER — Inpatient Hospital Stay (HOSPITAL_COMMUNITY): Payer: Medicare Other

## 2016-10-07 ENCOUNTER — Encounter (HOSPITAL_COMMUNITY): Payer: Medicare Other

## 2016-10-07 DIAGNOSIS — N179 Acute kidney failure, unspecified: Secondary | ICD-10-CM

## 2016-10-07 DIAGNOSIS — G8111 Spastic hemiplegia affecting right dominant side: Secondary | ICD-10-CM

## 2016-10-07 DIAGNOSIS — N183 Chronic kidney disease, stage 3 unspecified: Secondary | ICD-10-CM

## 2016-10-07 NOTE — Consult Note (Signed)
Neuropsychological Consultation   Patient:   Robert Buckley   DOB:   1945/06/16  MR Number:  [id number]  Location:  [facility] [address] [facility] [id number] [place] [address] Dept: [phone] Loc: [phone]           Date of Service:   10/07/2016  Start Time:   8 AM End Time:   9 AM  Provider/Observer:  Arley Phenix, Psy.D.       Clinical Neuropsychologist       Billing Code/Service: [phone] 4 Units  Chief Complaint:    Robert Buckley is a 71 year old right handed male with prior CVA in 2010.  The patient reports that he improved from this stroke but had continued right side weakness but was able to complete ADLs and get around and walk.  Presented on 09/28/2016 after fall due to right side weakness MRI showed acute 1 cm infarction in the left hemisphere.  The patient reports that he has had right side weakness effecting both his right arm and right leg.  He reports that he is coping ok with these changes without issues of depression or anxiety.  Denies other neuropsychological symptoms.  Receptive and Expressive Language abilities remain at baseline.    Reason for Service:  Robert Buckley was referred for neuropsychological consultation due to acute effects of left hemisphere stroke.  Right side hemiparesis.  Coping with acute changes and loss of motor function.  Below is the HPI for the current admission.   Robert Buckley is a 71 year old right-handed male with 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 Per chart review patient lives in a handicapped accessible apartment. Ambulated with a rolling walker modified independent prior to admission. He was able to shower and dress himself. He has 2 sons who assist with much of his household duties. Presented 09/28/2016 after a recent fall 09/26/2016 without loss of consciousness while trying  to get up from his chair as well as reported increased right sided weakness. Cranial CT scan with no acute changes. Lumbar spine films negative. MRI showed acute 1 cm infarction in the left hemisphere radiating white at her tracts posterior corona radiata. No hemorrhage or mass effect. Patient did not receive TPA.Carotid Dopplers with no ICA stenosis. Echocardiogram with ejection fraction of 60% grade 1 diastolic dysfunction.Marland KitchenMRA with no large vessel occlusion aneurysm or significant stenosis.Currently continues on Plavix for CVA prophylaxis. Subcutaneous Lovenox for DVT prophylaxis. Creatinine 1.66 on admission and no prior labs forcomparisonand latest creatinine 1.48. Tolerating a regular consistency diet. Urine culture greater than 100,000 Escherichia coli maintained on Rocephin and changed to Keflex 09/11/2016. Blood culture showing no growth. Physical therapy evaluation completed with recommendations of physical medicine rehabilitation consult.  Current Status:  The patient reports symptoms of right hemiparesis.  His mood is good and reports that he is coping adequately, but starting to worry some as he is not improving as fast as he remembers from the stroke he had in 2010.  Behavioral Observation: Robert Buckley  presents as a 71 y.o.-year-old Right African American Male who appeared his stated age. his dress was Appropriate and he was Well Groomed and his manners were Appropriate to the situation.  his participation was indicative of Appropriate and Attentive behaviors.  There were physical disabilities noted related to right side weakness.  he displayed an appropriate level of cooperation and motivation.  Interactions:    Active Appropriate and Attentive  Attention:   within normal limits and attention span and concentration were age appropriate  Memory:   within normal limits; recent and remote memory intact  Visuo-spatial:  within normal limits  Speech  (Volume):  normal  Speech:   normal; normal  Thought Process:  Coherent and Relevant  Though Content:  WNL; not suicidal  Orientation:   person, place, time/date and situation  Judgment:   Good  Planning:   Good  Affect:    Anxious  Mood:    Anxious  Insight:   Present  Intelligence:   normal  Medical History:   Past Medical History:  Diagnosis Date  . Arthritis   . Hypertension   . Stroke [facility]) 2008   residual R sided weakness       Abuse/Trauma History: Patient denies prior history of abuse or trauma  Family Med/Psych History:  Family History  Problem Relation Age of Onset  . Diabetes Mother     Risk of Suicide/Violence: low patient denies SI or HI  Impression/DX:  Robert Buckley is a 71 year old right handed male with prior CVA in 2010.  The patient reports that he improved from this stroke but had continued right side weakness but was able to complete ADLs and get around and walk.  Presented on 09/28/2016 after fall due to right side weakness MRI showed acute 1 cm infarction in the left hemisphere.  The patient reports that he has had right side weakness effecting both his right arm and right leg.  He reports that he is coping ok with these changes without issues of depression or anxiety.  Denies other neuropsychological symptoms.  Receptive and Expressive Language abilities remain at baseline.   Robert Buckley memory, executive functions, attention, and other neuropsychological functioning appear to within normal limits.  His deficits appear to be primarily due to right side motor functioning.  Disposition/Plan:  Will see the patient again if the mild anxiety he is reporting worsens.  Have worked with patient on some coping skills and ways to cope with current situation.    Diagnosis:    CVA Right        Electronically Signed   _______________________ Arley Phenix, Psy.D.

## 2016-10-07 NOTE — Progress Notes (Signed)
Physical Therapy Session Note  Patient Details  Name: Robert Buckley MRN: [id number] Date of Birth: Mar 29, 1945  Today's Date: 10/07/2016 PT Individual Time: 8182-9937 PT Individual Time Calculation (min): 72 min   Short Term Goals: Week 1:  PT Short Term Goal 1 (Week 1): Pt will ambulate 50' Mod A w/ LRAD PT Short Term Goal 2 (Week 1): Pt will transfer bed<>chair w/ Min A  PT Short Term Goal 3 (Week 1): Pt will perform sit<>stand transfer w/ Min A PT Short Term Goal 4 (Week 1): Pt will maintain dynamic sitting balance w/ supervision  Skilled Therapeutic Interventions/Progress Updates:    Pt seated in w/c upon PT arrival, agreeable to therapy tx and denies pain. Pt propelled w/c to gym using L hemi technique with verbal cue for technique and min assist needed for steering, increased time to complete. Pt ambulated x 25 ft using RW and mod assist, verbal cues for upright posture and sequencing, pt with increased knee hyperextension on R LE during stance phase. Performed mini squats 3 x10 using RW for UE support focus on R LE neuromuscular re-ed, facilitation for left lateral weightshift to promote weightbearing on R LE, facilitation for knee control to prevent hyperextension. Standing with RW worked on knee control during stance phase with R LE while taking steps forward/backward x 10 with L LE, requiring manual facilitation and verbal cues to prevent hyperextension. Trialed ambulation x 15 ft using RW and AFO, pt requiring mod assist, demo AFO did not fit well and pt may benefit from orthotist consult for better fit of AFO. Pt reported feeling soiled, incontinent of bladder through briefs and shorts. PT transported pt back to room total A. Pt performed sit<>stands x 2 using RW, verbal cues for use of R LE, in order to doff dirty briefs/pants and don clean briefs/pants, pt worked on dynamic standing balance to help pull up/down and adjust pants over hips with min assist. Pt left seated in w/c at end of  session with needs in reach.   Therapy Documentation Precautions:  Precautions Precautions: Fall Precaution Comments: R hemiparesis Restrictions Weight Bearing Restrictions: No   See Function Navigator for Current Functional Status.   Therapy/Group: Individual Therapy  Cresenciano Genre, PT, DPT 10/07/2016, 3:40 PM

## 2016-10-07 NOTE — Progress Notes (Signed)
Speech Language Pathology Daily Session Note  Patient Details  Name: Robert Buckley MRN: [id number] Date of Birth: [birth year]  Today's Date: 10/07/2016 SLP Individual Time: 0915-0955 SLP Individual Time Calculation (min): 40 min  Short Term Goals: Week 1: SLP Short Term Goal 1 (Week 1): Pt will use external memory aids to facilitate recall of daily information with min assist verbal cues.  SLP Short Term Goal 2 (Week 1): Pt will complete basic, familiar tasks with min assist verbal cues for functional problem solving.  SLP Short Term Goal 3 (Week 1): Pt will sustain his attention to a basic, familiar task for 15 minutes with supervision verbal cues for redirection.   SLP Short Term Goal 4 (Week 1): Pt will return demonstration of at least 3 safety precautions during functional tasks with min assist.    Skilled Therapeutic Interventions: Skilled treatment session focused on cognitive goals. Upon arrival, patient was supine in bed. Patient demonstrated appropriate intellectual awareness of deficits by asking for assistance to transfer and directing set-up of wheelchair but also appeared to underestimate his physical abilities and requesting too much assistance from clinician. SLP facilitated session by providing Mod A verbal cues for problem solving during a 4 step and 6 step picture sequencing task but was Mod I for attention to task in a mildly distracting environment for ~20 minutes. Patient left upright in wheelchair with all needs within reach and quick release belt in place. Continue with current plan of care.      Function:  Eating Eating   Modified Consistency Diet: No Eating Assist Level: Set up assist for   Eating Set Up Assist For: Opening containers       Cognition Comprehension Comprehension assist level: Understands basic 90% of the time/cues < 10% of the time  Expression   Expression assist level: Expresses basic 90% of the time/requires cueing < 10% of the time.  Social  Interaction Social Interaction assist level: Interacts appropriately 90% of the time - Needs monitoring or encouragement for participation or interaction.  Problem Solving Problem solving assist level: Solves basic 75 - 89% of the time/requires cueing 10 - 24% of the time  Memory Memory assist level: Recognizes or recalls 90% of the time/requires cueing < 10% of the time    Pain No/Denies Pain   Therapy/Group: Individual Therapy  [name] 10/07/2016, 3:10 PM

## 2016-10-07 NOTE — Progress Notes (Signed)
Occupational Therapy Session Note  Patient Details  Name: Robert Buckley MRN: [id number] Date of Birth: Dec 13, 1945  Today's Date: 10/07/2016 OT Individual Time: 1100-1155 OT Individual Time Calculation (min): 55 min    Short Term Goals: Week 1:  OT Short Term Goal 1 (Week 1): Pt will complete all bathing with min assist sit to stand.  OT Short Term Goal 2 (Week 1): Pt will complete LB dressing with mod assist for donning pants and gripper socks. OT Short Term Goal 3 (Week 1): Pt will complete toilet transfers stand pivot with min assist to the 3:1 using the RW.  OT Short Term Goal 4 (Week 1): Pt will demonstrate RUE use at a active assist level with supervision for washing the left arm during bathing tasks.   Skilled Therapeutic Interventions/Progress Updates:    Pt resting in w/c upon arrival.  Pt declined bathing and changing pants this morning but requested to change shirts.  Pt doffed and donned pullover shirt without assistance.  Pt transitioned to gym and practiced sit<>stand and stand pivot transfers with min A.  Pt's R knee hyperextends when standing and during transfers.  Pt states his RLE has been "like that" since his stroke in 2010.  Pt requires min A for sit<>stand and for standing balance.  Pt also engaged in table activities (theraputty) using RUE.  Pt returned to room and remained in w/c with QRB in place and all needs within reach.   Therapy Documentation Precautions:  Precautions Precautions: Fall Precaution Comments: R hemiparesis Restrictions Weight Bearing Restrictions: No Pain:  pt denies pain  See Function Navigator for Current Functional Status.   Therapy/Group: Individual Therapy  Treyvor, Marsala 10/07/2016, 11:55 AM

## 2016-10-07 NOTE — Progress Notes (Signed)
Occupational Therapy Session Note  Patient Details  Name: Robert Buckley MRN: [id number] Date of Birth: May 16, 1945  Today's Date: 10/07/2016 OT Individual Time: 1330-1400 OT Individual Time Calculation (min): 30 min    Short Term Goals: Week 1:  OT Short Term Goal 1 (Week 1): Pt will complete all bathing with min assist sit to stand.  OT Short Term Goal 2 (Week 1): Pt will complete LB dressing with mod assist for donning pants and gripper socks. OT Short Term Goal 3 (Week 1): Pt will complete toilet transfers stand pivot with min assist to the 3:1 using the RW.  OT Short Term Goal 4 (Week 1): Pt will demonstrate RUE use at a active assist level with supervision for washing the left arm during bathing tasks.   Skilled Therapeutic Interventions/Progress Updates:    Pt seen for OT session focusing on neuro re-ed with weightbearing/ shifting in R UE/LE. Pt sitting up in w/c upon arrival, agreeable to tx session and desiring to "work on my R leg and arm". Pt taken to therapy day room total A in w/c for time and energy conservation. Completed standing towel pushes at high/low table, reaching to R, L, and middle for weightbearing and weight shifting. Completed x3 trials with seated rest breaks btwn trials. Pt tolerated ~1 minute standing trials before requiring seated rest break. Pt returned to room at end of session, left seated with all needs in reach and QRB donned. Discussed role of OT, POC, and d/c planning throughout session.   Therapy Documentation Precautions:  Precautions Precautions: Fall Precaution Comments: R hemiparesis Restrictions Weight Bearing Restrictions: No Pain:   No/ denies pain  See Function Navigator for Current Functional Status.   Therapy/Group: Individual Therapy  Lewis, Everard Interrante C 10/07/2016, 7:20 AM

## 2016-10-07 NOTE — Progress Notes (Signed)
Subjective/Complaints: Pt seen laying in bed this AM.  He states he slept well overnight and is doing well.   ROS: Denies CP. SOB, N/V/D  Objective: Vital Signs: Blood pressure (!) 144/78, pulse 64, temperature 98 F (36.7 C), temperature source Oral, resp. rate 18, height _0  (1.702 m), weight 89.4 kg (197 lb 3.2 oz), SpO2 96 %. No results found. Results for orders placed or performed during the hospital encounter of 10/01/16 (from the past 72 hour(s))  Basic metabolic panel     Status: Abnormal   Collection Time: 10/05/16  6:07 AM  Result Value Ref Range   Sodium 141 135 - 145 mmol/L   Potassium 4.3 3.5 - 5.1 mmol/L   Chloride 114 (H) 101 - 111 mmol/L   CO2 20 (L) 22 - 32 mmol/L   Glucose, Bld 101 (H) 65 - 99 mg/dL   BUN 24 (H) 6 - 20 mg/dL   Creatinine, Ser 1.81 (H) 0.61 - 1.24 mg/dL   Calcium 8.7 (L) 8.9 - 10.3 mg/dL   GFR calc non Af Amer 36 (L) >60 mL/min   GFR calc Af Amer 42 (L) >60 mL/min    Comment: (NOTE) The eGFR has been calculated using the CKD EPI equation. This calculation has not been validated in all clinical situations. eGFR's persistently <60 mL/min signify possible Chronic Kidney Disease.    Anion gap 7 5 - 15    Gen: NAD. Vital signs reviewed.  HEENT: normocephalic, atraumatic Cardio: RRR and no JVD Resp: CTA B/L and unlabored GI: BS positive and ND Musc/Skel:  No edema. No tenderness. Neuro: Alert/Oriented Motor: 4/5 R delt, bi, tri, grip. mAS 1/4 elbow flexors 3/5 HF, KE, 2-/5 ADF/PF  Skin:   Intact. Warm and dry Psych: Normal mood and behavior  Assessment/Plan: 1. Functional deficits secondary to left corona radiata infarct which require 3+ hours per day of interdisciplinary therapy in a comprehensive inpatient rehab setting. Physiatrist is providing close team supervision and 24 hour management of active medical problems listed below. Physiatrist and rehab team continue to assess barriers to discharge/monitor patient progress toward  functional and medical goals. FIM: Function - Bathing Position: Shower Body parts bathed by patient: Right arm, Chest, Abdomen, Front perineal area, Buttocks, Right upper leg, Left upper leg, Right lower leg, Left lower leg, Left arm Body parts bathed by helper: Back, Left arm Assist Level: Touching or steadying assistance(Pt > 75%)  Function- Upper Body Dressing/Undressing What is the patient wearing?: Pull over shirt/dress Pull over shirt/dress - Perfomed by patient: Thread/unthread right sleeve, Thread/unthread left sleeve, Put head through opening, Pull shirt over trunk Pull over shirt/dress - Perfomed by helper: Pull shirt over trunk Assist Level: Supervision or verbal cues Function - Lower Body Dressing/Undressing What is the patient wearing?: Pants Position: Wheelchair/chair at sink Pants- Performed by patient: Thread/unthread right pants leg, Pull pants up/down, Thread/unthread left pants leg Pants- Performed by helper: Thread/unthread left pants leg Non-skid slipper socks- Performed by patient: Don/doff right sock, Don/doff left sock Non-skid slipper socks- Performed by helper: Don/doff left sock TED Hose - Performed by helper: Don/doff right TED hose, Don/doff left TED hose Assist for footwear: Dependant Assist for lower body dressing: Touching or steadying assistance (Pt > 75%)  Function - Toileting Toileting steps completed by patient: Adjust clothing prior to toileting Toileting steps completed by helper: Adjust clothing prior to toileting, Performs perineal hygiene, Adjust clothing after toileting Toileting Assistive Devices: Grab bar or rail Assist level: Touching or steadying assistance (Pt.75%)  Function - Air cabin crew transfer assistive device: Bedside commode Assist level to toilet: 2 helpers (per [place], [place]) Assist level from toilet: 2 helpers Assist level to bedside commode (at bedside): Moderate assist (Pt 50 - 74%/lift or lower) Assist level  from bedside commode (at bedside): Moderate assist (Pt 50 - 74%/lift or lower)  Function - Chair/bed transfer Chair/bed transfer method: Stand pivot Chair/bed transfer assist level: Moderate assist (Pt 50 - 74%/lift or lower) Chair/bed transfer assistive device: Armrests, Bedrails Chair/bed transfer details: Verbal cues for sequencing, Verbal cues for technique, Manual facilitation for weight shifting, Manual facilitation for placement  Function - Locomotion: Wheelchair Will patient use wheelchair at discharge?: Yes Type: Manual Max wheelchair distance: 25 Assist Level: Moderate assistance (Pt 50 - 74%) Wheel 50 feet with 2 turns activity did not occur: Safety/medical concerns Assist Level: Moderate assistance (Pt 50 - 74%) Wheel 150 feet activity did not occur: N/A Turns around,maneuvers to table,bed, and toilet,negotiates 3% grade,maneuvers on rugs and over doorsills: No Function - Locomotion: Ambulation Assistive device: Walker-rolling Max distance: 25' Assist level: Moderate assist (Pt 50 - 74%) Assist level: Moderate assist (Pt 50 - 74%) Walk 50 feet with 2 turns activity did not occur: Safety/medical concerns Walk 150 feet activity did not occur: Safety/medical concerns Walk 10 feet on uneven surfaces activity did not occur: Safety/medical concerns  Function - Comprehension Comprehension: Auditory Comprehension assist level: Understands basic 90% of the time/cues < 10% of the time  Function - Expression Expression: Verbal Expression assist level: Expresses basic 90% of the time/requires cueing < 10% of the time.  Function - Social Interaction Social Interaction assist level: Interacts appropriately 90% of the time - Needs monitoring or encouragement for participation or interaction.  Function - Problem Solving Problem solving assist level: Solves basic 90% of the time/requires cueing < 10% of the time  Function - Memory Memory assist level: Recognizes or recalls 90% of  the time/requires cueing < 10% of the time Patient normally able to recall (first 3 days only): Current season, Staff names and faces, That he or she is in a hospital   Medical Problem List and Plan: 1. Right side weaknesssecondary to acute infarction left hemisphere/corona radiata. History of CVA 2010 with right-sided weakness  Cont CIR   Notes reviewed, images reviewed, discussed with covering physician 2. DVT Prophylaxis/Anticoagulation: Subcutaneous Lovenox. Monitor for any bleeding episodes 3. Pain Management: Tylenol as needed 4. Mood: Provide emotional support 5. Neuropsych: This patient iscapable of making decisions on hisown behalf. 6. Skin/Wound Care: Routine skin checks 7. Fluids/Electrolytes/Nutrition: Routine I&Os 8.CKD 3 with AKI  Cr 1.81 on 8/25  Encourage fluids 9.Escherichia coli urinary tract infection. Rocephin changed to Keflex 10/01/2016-afebrile finish 7 d course 10.Hyperlipidemia. Lipitor 11.Hypertension. Monitor with increased mobility  Restarted norvasc 36m on 8/24 Vitals:   10/06/16 1407 10/07/16 0536  BP: (!) 142/90 (!) 144/78  Pulse: 73 64  Resp: 16 18  Temp: 98.4 F (36.9 C) 98 F (36.7 C)  SpO2: 96% 96%   12.BPH. Proscar  -up out of bed to void when possible   13.  Hypokalemia  K+ 4.3 on 8/25  Cont to monitor  LOS (Days) 6 A FACE TO FACE EVALUATION WAS PERFORMED  Adiah Guereca ALorie Phenix8/27/2018, 8:54 AM

## 2016-10-08 ENCOUNTER — Inpatient Hospital Stay (HOSPITAL_COMMUNITY): Payer: Medicare Other

## 2016-10-08 ENCOUNTER — Encounter (HOSPITAL_COMMUNITY): Payer: Medicare Other | Admitting: Psychology

## 2016-10-08 ENCOUNTER — Inpatient Hospital Stay (HOSPITAL_COMMUNITY): Payer: Medicare Other | Admitting: Occupational Therapy

## 2016-10-08 LAB — CREATININE, SERUM
CREATININE: 1.78 mg/dL — AB (ref 0.61–1.24)
GFR calc Af Amer: 43 mL/min — ABNORMAL LOW (ref >60)
GFR calc non Af Amer: 37 mL/min — ABNORMAL LOW (ref >60)

## 2016-10-08 NOTE — Progress Notes (Signed)
Physical Therapy Session Note  Patient Details  Name: Robert Buckley MRN: [id number] Date of Birth: 10/06/1945  Today's Date: 10/08/2016 PT Individual Time: 1045-1130, 1430-1500 PT Individual Time Calculation (min): 45 min , 30 min  Short Term Goals: Week 1:  PT Short Term Goal 1 (Week 1): Pt will ambulate 50' Mod A w/ LRAD PT Short Term Goal 2 (Week 1): Pt will transfer bed<>chair w/ Min A  PT Short Term Goal 3 (Week 1): Pt will perform sit<>stand transfer w/ Min A PT Short Term Goal 4 (Week 1): Pt will maintain dynamic sitting balance w/ supervision  Skilled Therapeutic Interventions/Progress Updates:    Session 1: Pt received from OT in the gym, agreeable to therapy tx and denies pain. Pt performed sit<>stand with min assist and RW, standing pt worked on stepping forward and backward to colored dot on the floor working on neuromuscular re-ed and dynamic balance, x 10 each leg with facilitation for R LE advancement. Pt performed 2 x 5 sit to stand with Min assist and without UE support, focus on R LE use and weightbearing. Pt performed 2 x 5 sit to stands with dina disc under R foot in order to promote L LE use, min assist for balance, verbal and tactile cues for upright posture in standing. Pt ambulated 2 x 15ft using RW and mod assist, pt with difficulty initiating swing with R LE secondary to hip flexor weakness, pt requiring manual facilitation for R LE advancement and facilitation for knee control to prevent hyperextension during stance. Pt transferred from mat to w/c with mod assist using RW. Pt left seated in w/c at end of session with needs in reach and QRB in place.   Session 2: Pt seated in w/c upon PT arrival, agreeable to therapy tx and denies pian. Pt propelled w/c x 50 ft using L hemi technique with min assist for steering. Pt transferred from w/c <> nustep, stand pivot with mod assist using RW. Pt used nustep for R LE and UE neuro re-education and to promote reciprocal movement,  x 8 minutes on workload 3. Pt transferred back to w/c mod assist with RW, verbal cues for technique and foot placement, verbal cues to attend to R side. Pt left in w/c at end of session with needs in reach and QRB in place.     Therapy Documentation Precautions:  Precautions Precautions: Fall Precaution Comments: R hemiparesis Restrictions Weight Bearing Restrictions: No   See Function Navigator for Current Functional Status.   Therapy/Group: Individual Therapy  Cresenciano Genre, PT, DPT 10/08/2016, 10:59 AM

## 2016-10-08 NOTE — Progress Notes (Signed)
Subjective/Complaints: Pt seen sitting up in bed this AM.  He slept well overnight.  Denies complaints.   ROS: Denies CP, SOB, N/V/D  Objective: Vital Signs: Blood pressure 136/83, pulse 68, temperature 97.9 F (36.6 C), temperature source Oral, resp. rate 18, height '5\' 7"'  (1.702 m), weight 89.4 kg (197 lb 3.2 oz), SpO2 96 %. No results found. Results for orders placed or performed during the hospital encounter of 10/01/16 (from the past 72 hour(s))  Creatinine, serum     Status: Abnormal   Collection Time: 10/08/16  5:27 AM  Result Value Ref Range   Creatinine, Ser 1.78 (H) 0.61 - 1.24 mg/dL   GFR calc non Af Amer 37 (L) >60 mL/min   GFR calc Af Amer 43 (L) >60 mL/min    Comment: (NOTE) The eGFR has been calculated using the CKD EPI equation. This calculation has not been validated in all clinical situations. eGFR's persistently <60 mL/min signify possible Chronic Kidney Disease.     Gen: NAD. Vital signs reviewed.  HEENT: normocephalic, atraumatic Cardio: RRR and no JVD Resp: CTA B/L and unlabored GI: BS positive and ND Musc/Skel:  No edema. No tenderness. Neuro: Alert/Oriented Motor: 4/5 R delt, bi, tri, grip. mAS 1/4 elbow flexors (stable) 3/5 HF, KE, 2-/5 ADF/PF  Skin:   Intact. Warm and dry Psych: Normal mood and behavior  Assessment/Plan: 1. Functional deficits secondary to left corona radiata infarct which require 3+ hours per day of interdisciplinary therapy in a comprehensive inpatient rehab setting. Physiatrist is providing close team supervision and 24 hour management of active medical problems listed below. Physiatrist and rehab team continue to assess barriers to discharge/monitor patient progress toward functional and medical goals. FIM: Function - Bathing Position: Shower Body parts bathed by patient: Right arm, Chest, Abdomen, Front perineal area, Buttocks, Right upper leg, Left upper leg, Right lower leg, Left lower leg, Left arm Body parts bathed by  helper: Back, Left arm Assist Level: Touching or steadying assistance(Pt > 75%)  Function- Upper Body Dressing/Undressing What is the patient wearing?: Pull over shirt/dress Pull over shirt/dress - Perfomed by patient: Thread/unthread right sleeve, Thread/unthread left sleeve, Put head through opening, Pull shirt over trunk Pull over shirt/dress - Perfomed by helper: Pull shirt over trunk Assist Level: Supervision or verbal cues Function - Lower Body Dressing/Undressing What is the patient wearing?: Pants Position: Wheelchair/chair at sink Pants- Performed by patient: Thread/unthread right pants leg, Pull pants up/down, Thread/unthread left pants leg Pants- Performed by helper: Thread/unthread left pants leg Non-skid slipper socks- Performed by patient: Don/doff right sock, Don/doff left sock Non-skid slipper socks- Performed by helper: Don/doff left sock TED Hose - Performed by helper: Don/doff right TED hose, Don/doff left TED hose Assist for footwear: Dependant Assist for lower body dressing: Touching or steadying assistance (Pt > 75%)  Function - Toileting Toileting steps completed by patient: Adjust clothing prior to toileting Toileting steps completed by helper: Adjust clothing prior to toileting, Performs perineal hygiene, Adjust clothing after toileting Toileting Assistive Devices: Grab bar or rail Assist level: Two helpers  Function - Air cabin crew transfer assistive device: Bedside commode Assist level to toilet: 2 helpers (per [place] Ingram, NT) Assist level from toilet: 2 helpers Assist level to bedside commode (at bedside): Moderate assist (Pt 50 - 74%/lift or lower) Assist level from bedside commode (at bedside): Moderate assist (Pt 50 - 74%/lift or lower)  Function - Chair/bed transfer Chair/bed transfer method: Stand pivot Chair/bed transfer assist level: Moderate assist (Pt 50 - 74%/lift  or lower) Chair/bed transfer assistive device: Armrests,  Bedrails Chair/bed transfer details: Verbal cues for sequencing, Verbal cues for technique, Manual facilitation for weight shifting, Manual facilitation for placement  Function - Locomotion: Wheelchair Will patient use wheelchair at discharge?: Yes Type: Manual Max wheelchair distance: 25 Assist Level: Moderate assistance (Pt 50 - 74%) Wheel 50 feet with 2 turns activity did not occur: Safety/medical concerns Assist Level: Moderate assistance (Pt 50 - 74%) Wheel 150 feet activity did not occur: N/A Turns around,maneuvers to table,bed, and toilet,negotiates 3% grade,maneuvers on rugs and over doorsills: No Function - Locomotion: Ambulation Assistive device: Walker-rolling Max distance: 25' Assist level: Moderate assist (Pt 50 - 74%) Assist level: Moderate assist (Pt 50 - 74%) Walk 50 feet with 2 turns activity did not occur: Safety/medical concerns Walk 150 feet activity did not occur: Safety/medical concerns Walk 10 feet on uneven surfaces activity did not occur: Safety/medical concerns  Function - Comprehension Comprehension: Auditory Comprehension assist level: Understands basic 90% of the time/cues < 10% of the time  Function - Expression Expression: Verbal Expression assist level: Expresses basic 50 - 74% of the time/requires cueing 25 - 49% of the time. Needs to repeat parts of sentences.  Function - Social Interaction Social Interaction assist level: Interacts appropriately 50 - 74% of the time - May be physically or verbally inappropriate.  Function - Problem Solving Problem solving assist level: Solves basic 50 - 74% of the time/requires cueing 25 - 49% of the time  Function - Memory Memory assist level: Recognizes or recalls 50 - 74% of the time/requires cueing 25 - 49% of the time Patient normally able to recall (first 3 days only): Current season, Staff names and faces, That he or she is in a hospital   Medical Problem List and Plan: 1. Right side  weaknesssecondary to acute infarction left hemisphere/corona radiata. 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 DVT Prophylaxis/Anticoagulation: Subcutaneous Lovenox. Monitor for any bleeding episodes 3. Pain Management: Tylenol as needed 4. Mood: Provide emotional support 5. Neuropsych: This patient iscapable of making decisions on hisown behalf. 6. Skin/Wound Care: Routine skin checks 7. Fluids/Electrolytes/Nutrition: Routine I&Os 8.CKD 3 with AKI  Cr 1.81 on 8/25  Encourage fluids  Labs ordered for tomorrow, will consider IVF 9.Escherichia coli urinary tract infection. Rocephin changed to Keflex 10/01/2016-8/28 10.Hyperlipidemia. Lipitor 11.Hypertension. Monitor with increased mobility  Restarted norvasc 54m on 8/24 Vitals:   10/07/16 1305 10/08/16 0616  BP: 138/88 136/83  Pulse: 65 68  Resp: 20 18  Temp: 98.7 F (37.1 C) 97.9 F (36.6 C)  SpO2: 96% 96%   Controlled 8/28  Cont to monitor 12.BPH. Proscar -up out of bed to void when possible   13.  Hypokalemia  K+ 4.3 on 8/25  Cont to monitor  LOS (Days) 7 A FACE TO FACE EVALUATION WAS PERFORMED  Ankit ALorie Phenix8/28/2018, 8:40 AM

## 2016-10-08 NOTE — Plan of Care (Signed)
Problem: RH BLADDER ELIMINATION Goal: RH STG MANAGE BLADDER WITH ASSISTANCE STG Manage Bladder With mod Assistance  Outcome: Not Progressing Incontinent   

## 2016-10-08 NOTE — Progress Notes (Signed)
Speech Language Pathology Daily Session Note  Patient Details  Name: Robert Buckley MRN: [id number] Date of Birth: 06-19-1945  Today's Date: 10/08/2016 SLP Individual Time: 0807-0900 SLP Individual Time Calculation (min): 53 min  Short Term Goals: Week 1: SLP Short Term Goal 1 (Week 1): Pt will use external memory aids to facilitate recall of daily information with min assist verbal cues.  SLP Short Term Goal 2 (Week 1): Pt will complete basic, familiar tasks with min assist verbal cues for functional problem solving.  SLP Short Term Goal 3 (Week 1): Pt will sustain his attention to a basic, familiar task for 15 minutes with supervision verbal cues for redirection.   SLP Short Term Goal 4 (Week 1): Pt will return demonstration of at least 3 safety precautions during functional tasks with min assist.    Skilled Therapeutic Interventions: Skilled ST services focused on cognitive goals. Pt unable to recall safety precautions given question cues, therefore SLP demonstrated and explained the function of three safety precautions utilized in room and pt demonstrated ability to recall given Mod verbal and visual cues. Pt demonstrated ability to recall events on schedule utilizing visual aid with Mod verbal cues. Pt demonstrated sustained attention given supervision cues for 20 minutes at a time, during problem solving safety tasks, utilizing novel picture cards requiring Mod verbal and question cues to identify problems and solutions. Pt expressed shortness of breath, SLP checked vital signs O2 stats 96-97 and notified nursing staff, PT was left in room in bed with call bell in reach and nurse present. Recommend to continue ST services.     Function:  Cognition Comprehension Comprehension assist level: Understands basic 90% of the time/cues < 10% of the time  Expression   Expression assist level: Expresses basic 50 - 74% of the time/requires cueing 25 - 49% of the time. Needs to repeat parts of  sentences.  Social Interaction Social Interaction assist level: Interacts appropriately 50 - 74% of the time - May be physically or verbally inappropriate.  Problem Solving Problem solving assist level: Solves basic 50 - 74% of the time/requires cueing 25 - 49% of the time  Memory Memory assist level: Recognizes or recalls 50 - 74% of the time/requires cueing 25 - 49% of the time    Pain Pain Assessment Pain Assessment: No/denies pain  Therapy/Group: Individual Therapy  [name]  [facility] 10/08/2016, 9:16 AM

## 2016-10-08 NOTE — Progress Notes (Signed)
Occupational Therapy Session Note  Patient Details  Name: Robert Freid MRN: [id number] Date of Birth: 04-27-1945  Today's Date: 10/08/2016 OT Individual Time: 7654-6503 OT Individual Time Calculation (min): 60 min    Short Term Goals: Week 1:  OT Short Term Goal 1 (Week 1): Pt will complete all bathing with min assist sit to stand.  OT Short Term Goal 2 (Week 1): Pt will complete LB dressing with mod assist for donning pants and gripper socks. OT Short Term Goal 3 (Week 1): Pt will complete toilet transfers stand pivot with min assist to the 3:1 using the RW.  OT Short Term Goal 4 (Week 1): Pt will demonstrate RUE use at a active assist level with supervision for washing the left arm during bathing tasks.   Skilled Therapeutic Interventions/Progress Updates:    Pt received in w/c and declined bathing but did need to complete grooming and LB dressing. He worked on threading RLE in first with steadying A to keep his R ankle crossed over L knee and then he was able to don over L foot. Min A with sit to stand and pt then pulled pants over hips. Completed grooming at sink with set up.    Pt taken to gym and transferred to mat with RW with min A and cues to weight shift to advance/ step back with R foot. On mat, worked on upright posture as he sits with a rounded posture and then A/AROM exercises for RUE:   -For shoulder flex/ext, rolling arm forward back and forth.  -Elbow flexion reaching hand to chin, with elbow supported. -wrist extension with hand on bolster  Grasping and releasing cones and then bean bags focusing full finger extension.  Pt resting on mat for next PT session.  Therapy Documentation Precautions:  Precautions Precautions: Fall Precaution Comments: R hemiparesis Restrictions Weight Bearing Restrictions: No    Vital Signs: Therapy Vitals Pulse Rate: 80 Resp: 18 BP: 129/82 Patient Position (if appropriate): Sitting Oxygen Therapy SpO2: 97 % O2 Device: Not  Delivered Pain: Pain Assessment Pain Assessment: No/denies pain ADL:  See Function Navigator for Current Functional Status.   Therapy/Group: Individual Therapy  Montgomery Favor 10/08/2016, 11:38 AM

## 2016-10-08 NOTE — Consult Note (Addendum)
Neuropsychological Consultation   Patient:   Robert Buckley   DOB:   05-13-1945  MR Number:  [id number]  Location:  [facility] [address] [facility] [id number] [place] [address] Dept: [phone] Loc: [phone]           Date of Service:   10/08/2016  Start Time:   12:45 PM End Time:   1:15 PM  Provider/Observer:  Arley Phenix, Psy.D.       Clinical Neuropsychologist       Billing Code/Service: [phone] 4 Units  Chief Complaint:    Robert Buckley is a 71 year old right handed male with prior CVA in 2010.  The patient reports that he improved from this stroke but had continued right side weakness but was able to complete ADLs and get around and walk.  Presented on 09/28/2016 after fall due to right side weakness MRI showed acute 1 cm infarction in the left hemisphere.  The patient reports that he has had right side weakness effecting both his right arm and right leg.  He reports that he is coping ok with these changes without issues of depression or anxiety.  Denies other neuropsychological symptoms.  Receptive and Expressive Language abilities remain at baseline.    Reason for Service:  Robert Buckley was referred for neuropsychological consultation due to acute effects of left hemisphere stroke.  Right side hemiparesis.  Coping with acute changes and loss of motor function.  Below is the HPI for the current admission.   Robert Buckley is a 71 year old right-handed male with 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 Per chart review patient lives in a handicapped accessible apartment. Ambulated with a rolling walker modified independent prior to admission. He was able to shower and dress himself. He has 2 sons who assist with much of his household duties. Presented 09/28/2016 after a recent fall 09/26/2016 without loss of consciousness while  trying to get up from his chair as well as reported increased right sided weakness. Cranial CT scan with no acute changes. Lumbar spine films negative. MRI showed acute 1 cm infarction in the left hemisphere radiating white at her tracts posterior corona radiata. No hemorrhage or mass effect. Patient did not receive TPA.Carotid Dopplers with no ICA stenosis. Echocardiogram with ejection fraction of 60% grade 1 diastolic dysfunction.Marland KitchenMRA with no large vessel occlusion aneurysm or significant stenosis.Currently continues on Plavix for CVA prophylaxis. Subcutaneous Lovenox for DVT prophylaxis. Creatinine 1.66 on admission and no prior labs forcomparisonand latest creatinine 1.48. Tolerating a regular consistency diet. Urine culture greater than 100,000 Escherichia coli maintained on Rocephin and changed to Keflex 09/11/2016. Blood culture showing no growth. Physical therapy evaluation completed with recommendations of physical medicine rehabilitation consult.  Current Status:  The patient reports symptoms of right hemiparesis.  His mood is good and reports that he is coping adequately, but starting to worry some as he is not improving as fast as he remembers from the stroke he had in 2010.  10/08/2016 includes reports of more worry about not recovering.  Worked on those issues today.  Behavioral Observation: Robert Buckley  presents as a 71 y.o.-year-old Right African American Male who appeared his stated age. his dress was Appropriate and he was Well Groomed and his manners were Appropriate to the situation.  his participation was indicative of Appropriate and Attentive behaviors.  There were physical disabilities noted related to right  side weakness.  he displayed an appropriate level of cooperation and motivation.     Interactions:    Active Appropriate and Attentive  Attention:   within normal limits and attention span and concentration were age appropriate  Memory:   within normal limits; recent and  remote memory intact  Visuo-spatial:  within normal limits  Speech (Volume):  normal  Speech:   normal; normal  Thought Process:  Coherent and Relevant  Though Content:  WNL; not suicidal  Orientation:   person, place, time/date and situation  Judgment:   Good  Planning:   Good  Affect:    Anxious  Mood:    Anxious  Insight:   Present  Intelligence:   normal  Medical History:   Past Medical History:  Diagnosis Date  . Arthritis   . Hypertension   . Stroke [facility]) 2008   residual R sided weakness       Abuse/Trauma History: Patient denies prior history of abuse or trauma  Family Med/Psych History:  Family History  Problem Relation Age of Onset  . Diabetes Mother     Risk of Suicide/Violence: low patient denies SI or HI  Impression/DX:  Robert Buckley is a 71 year old right handed male with prior CVA in 2010.  The patient reports that he improved from this stroke but had continued right side weakness but was able to complete ADLs and get around and walk.  Presented on 09/28/2016 after fall due to right side weakness MRI showed acute 1 cm infarction in the left hemisphere.  The patient reports that he has had right side weakness effecting both his right arm and right leg.  He reports that he is coping ok with these changes without issues of depression or anxiety.  Denies other neuropsychological symptoms.  Receptive and Expressive Language abilities remain at baseline.   Robert Buckley memory, executive functions, attention, and other neuropsychological functioning appear to within normal limits.  His deficits appear to be primarily due to right side motor functioning.  The patient is reporting more worry about not recovering this time and working through how his life is going to change.  Need to watch for depression and anxiety developing.  Disposition/Plan:  Depending on hospital course, may see the patient again.  Patient is reporting more worry that he will never walk  again.    Diagnosis:    CVA Right        Electronically Signed   _______________________ Arley Phenix, Psy.D.

## 2016-10-09 ENCOUNTER — Inpatient Hospital Stay (HOSPITAL_COMMUNITY): Payer: Medicare Other

## 2016-10-09 ENCOUNTER — Inpatient Hospital Stay (HOSPITAL_COMMUNITY): Payer: Medicare Other | Admitting: Speech Pathology

## 2016-10-09 ENCOUNTER — Inpatient Hospital Stay (HOSPITAL_COMMUNITY): Payer: Medicare Other | Admitting: Occupational Therapy

## 2016-10-09 ENCOUNTER — Encounter (HOSPITAL_COMMUNITY): Payer: Self-pay

## 2016-10-09 LAB — BASIC METABOLIC PANEL
Anion gap: 9 (ref 5–15)
BUN: 22 mg/dL — AB (ref 6–20)
CALCIUM: 8.8 mg/dL — AB (ref 8.9–10.3)
CO2: 20 mmol/L — ABNORMAL LOW (ref 22–32)
CREATININE: 1.68 mg/dL — AB (ref 0.61–1.24)
Chloride: 113 mmol/L — ABNORMAL HIGH (ref 101–111)
GFR, EST AFRICAN AMERICAN: 46 mL/min — AB (ref >60)
GFR, EST NON AFRICAN AMERICAN: 40 mL/min — AB (ref >60)
Glucose, Bld: 106 mg/dL — ABNORMAL HIGH (ref 65–99)
Potassium: 4.1 mmol/L (ref 3.5–5.1)
SODIUM: 142 mmol/L (ref 135–145)

## 2016-10-09 NOTE — Progress Notes (Signed)
Subjective/Complaints: Pt seen laying in bed this AM.  He slept well overnight.  He has questions about dis charge date.   ROS: Denies CP, SOB, N/V/D  Objective: Vital Signs: Blood pressure (!) 148/87, pulse (!) 59, temperature 98.2 F (36.8 C), temperature source Oral, resp. rate 18, height '5\' 7"'  (1.702 m), weight 89.4 kg (197 lb 3.2 oz), SpO2 99 %. No results found. Results for orders placed or performed during the hospital encounter of 10/01/16 (from the past 72 hour(s))  Creatinine, serum     Status: Abnormal   Collection Time: 10/08/16  5:27 AM  Result Value Ref Range   Creatinine, Ser 1.78 (H) 0.61 - 1.24 mg/dL   GFR calc non Af Amer 37 (L) >60 mL/min   GFR calc Af Amer 43 (L) >60 mL/min    Comment: (NOTE) The eGFR has been calculated using the CKD EPI equation. This calculation has not been validated in all clinical situations. eGFR's persistently <60 mL/min signify possible Chronic Kidney Disease.   Basic metabolic panel     Status: Abnormal   Collection Time: 10/09/16  6:09 AM  Result Value Ref Range   Sodium 142 135 - 145 mmol/L   Potassium 4.1 3.5 - 5.1 mmol/L   Chloride 113 (H) 101 - 111 mmol/L   CO2 20 (L) 22 - 32 mmol/L   Glucose, Bld 106 (H) 65 - 99 mg/dL   BUN 22 (H) 6 - 20 mg/dL   Creatinine, Ser 1.68 (H) 0.61 - 1.24 mg/dL   Calcium 8.8 (L) 8.9 - 10.3 mg/dL   GFR calc non Af Amer 40 (L) >60 mL/min   GFR calc Af Amer 46 (L) >60 mL/min    Comment: (NOTE) The eGFR has been calculated using the CKD EPI equation. This calculation has not been validated in all clinical situations. eGFR's persistently <60 mL/min signify possible Chronic Kidney Disease.    Anion gap 9 5 - 15    Gen: NAD. Vital signs reviewed.  HEENT: normocephalic, atraumatic Cardio: RRR and no JVD Resp: CTA B/L and Unlabored GI: BS positive and ND Musc/Skel:  No edema. No tenderness. Neuro: Alert/Oriented Motor: 4/5 R delt, bi, tri, grip. mAS 1/4 elbow flexors (stable) RLE: 3/5 HF, KE,  ADF/PF  Skin:   Intact. Warm and dry Psych: Normal mood and behavior  Assessment/Plan: 1. Functional deficits secondary to left corona radiata infarct which require 3+ hours per day of interdisciplinary therapy in a comprehensive inpatient rehab setting. Physiatrist is providing close team supervision and 24 hour management of active medical problems listed below. Physiatrist and rehab team continue to assess barriers to discharge/monitor patient progress toward functional and medical goals. FIM: Function - Bathing Position: Shower Body parts bathed by patient: Right arm, Chest, Abdomen, Front perineal area, Buttocks, Right upper leg, Left upper leg, Right lower leg, Left lower leg, Left arm Body parts bathed by helper: Back, Left arm Assist Level: Touching or steadying assistance(Pt > 75%)  Function- Upper Body Dressing/Undressing What is the patient wearing?: Pull over shirt/dress Pull over shirt/dress - Perfomed by patient: Thread/unthread right sleeve, Thread/unthread left sleeve, Put head through opening, Pull shirt over trunk Pull over shirt/dress - Perfomed by helper: Pull shirt over trunk Assist Level: Supervision or verbal cues Function - Lower Body Dressing/Undressing What is the patient wearing?: Pants, Shoes, Ted Hose Position: Wheelchair/chair at [employer]- Performed by patient: Thread/unthread right pants leg, Pull pants up/down, Thread/unthread left pants leg Pants- Performed by helper: Thread/unthread left pants leg Non-skid slipper  socks- Performed by patient: Don/doff right sock, Don/doff left sock Non-skid slipper socks- Performed by helper: Don/doff left sock Shoes - Performed by patient: Don/doff left shoe Shoes - Performed by helper: Don/doff right shoe, Fasten right, Fasten left TED Hose - Performed by helper: Don/doff right TED hose, Don/doff left TED hose Assist for footwear: Dependant Assist for lower body dressing: Touching or steadying assistance (Pt >  75%)  Function - Toileting Toileting steps completed by patient: Adjust clothing prior to toileting Toileting steps completed by helper: Adjust clothing prior to toileting, Performs perineal hygiene, Adjust clothing after toileting Toileting Assistive Devices: Grab bar or rail Assist level: Two helpers  Function - Air cabin crew transfer assistive device: Bedside commode Assist level to toilet: Moderate assist (Pt 50 - 74%/lift or lower) Assist level from toilet: Moderate assist (Pt 50 - 74%/lift or lower) Assist level to bedside commode (at bedside): Moderate assist (Pt 50 - 74%/lift or lower) Assist level from bedside commode (at bedside): Moderate assist (Pt 50 - 74%/lift or lower)  Function - Chair/bed transfer Chair/bed transfer method: Stand pivot Chair/bed transfer assist level: Touching or steadying assistance (Pt > 75%) Chair/bed transfer assistive device: Armrests Chair/bed transfer details: Verbal cues for sequencing, Verbal cues for technique, Manual facilitation for weight shifting, Manual facilitation for placement  Function - Locomotion: Wheelchair Will patient use wheelchair at discharge?: Yes Type: Manual Max wheelchair distance: 25 Assist Level: Moderate assistance (Pt 50 - 74%) Wheel 50 feet with 2 turns activity did not occur: Safety/medical concerns Assist Level: Moderate assistance (Pt 50 - 74%) Wheel 150 feet activity did not occur: N/A Turns around,maneuvers to table,bed, and toilet,negotiates 3% grade,maneuvers on rugs and over doorsills: No Function - Locomotion: Ambulation Assistive device: Walker-rolling Max distance: 86f Assist level: Moderate assist (Pt 50 - 74%) Assist level: Moderate assist (Pt 50 - 74%) Walk 50 feet with 2 turns activity did not occur: Safety/medical concerns Walk 150 feet activity did not occur: Safety/medical concerns Walk 10 feet on uneven surfaces activity did not occur: Safety/medical concerns  Function -  Comprehension Comprehension: Auditory Comprehension assist level: Understands basic 90% of the time/cues < 10% of the time  Function - Expression Expression: Verbal Expression assist level: Expresses basic 50 - 74% of the time/requires cueing 25 - 49% of the time. Needs to repeat parts of sentences.  Function - Social Interaction Social Interaction assist level: Interacts appropriately 50 - 74% of the time - May be physically or verbally inappropriate.  Function - Problem Solving Problem solving assist level: Solves basic 50 - 74% of the time/requires cueing 25 - 49% of the time  Function - Memory Memory assist level: Recognizes or recalls 50 - 74% of the time/requires cueing 25 - 49% of the time Patient normally able to recall (first 3 days only): Current season, Staff names and faces, That he or she is in a hospital   Medical Problem List and Plan: 1. Right side weaknesssecondary to acute infarction left hemisphere/corona radiata. 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 DVT Prophylaxis/Anticoagulation: Subcutaneous Lovenox. Monitor for any bleeding episodes 3. Pain Management: Tylenol as needed 4. Mood: Provide emotional support 5. Neuropsych: This patient iscapable of making decisions on hisown behalf. 6. Skin/Wound Care: Routine skin checks 7. Fluids/Electrolytes/Nutrition: Routine I&Os 8.CKD 3  Cr 1.68 on 8/29  Encourage fluids 9.Escherichia coli urinary tract infection. Rocephin changed to Keflex completed 8/28 10.Hyperlipidemia. Lipitor 11.Hypertension. Monitor with increased mobility  Restarted norvasc 56mon 8/24 Vitals:  10/08/16 1500 10/09/16 0600  BP: (!) 141/81 (!) 148/87  Pulse: 74 (!) 59  Resp: 18 18  Temp: 98.3 F (36.8 C) 98.2 F (36.8 C)  SpO2: 95% 99%   Overall controlled 8/29  Cont to monitor 12.BPH. Proscar -up out of bed to void when possible   13.  Hypokalemia: Resolved  K+ 4.1 on 8/29  Cont to  monitor  LOS (Days) 8 A FACE TO FACE EVALUATION WAS PERFORMED  Alyson Ki Lorie Phenix 10/09/2016, 8:21 AM

## 2016-10-09 NOTE — Progress Notes (Signed)
Physical Therapy Weekly Progress Note  Patient Details  Name: Robert Buckley MRN: [id number] Date of Birth: Feb 28, 1945  Beginning of progress report period: October 02, 2016 End of progress report period: October 09, 2016  Today's Date: 10/09/2016 PT Individual Time: 2263-3354, 1300-1400 PT Individual Time Calculation (min): 45 min, 60 min   Patient has met 4 of 4 short term goals.    Patient continues to demonstrate the following deficits muscle weakness, impaired timing and sequencing, abnormal tone, decreased coordination and decreased motor planning, decreased midline orientation and decreased standing balance, decreased postural control, hemiplegia and decreased balance strategies and therefore will continue to benefit from skilled PT intervention to increase functional independence with mobility.  Patient progressing toward long term goals..  Continue plan of care.  PT Short Term Goals Week 1:  PT Short Term Goal 1 (Week 1): Pt will ambulate 50' Mod A w/ LRAD PT Short Term Goal 1 - Progress (Week 1): Met PT Short Term Goal 2 (Week 1): Pt will transfer bed<>chair w/ Min A  PT Short Term Goal 2 - Progress (Week 1): Met PT Short Term Goal 3 (Week 1): Pt will perform sit<>stand transfer w/ Min A PT Short Term Goal 3 - Progress (Week 1): Met PT Short Term Goal 4 (Week 1): Pt will maintain dynamic sitting balance w/ supervision PT Short Term Goal 4 - Progress (Week 1): Met Week 2:  PT Short Term Goal 1 (Week 2): STG=LTG due to ELOS  Skilled Therapeutic Interventions/Progress Updates:  Ambulation/gait training;Balance/vestibular training;Discharge planning;Community reintegration;Cognitive remediation/compensation;DME/adaptive equipment instruction;Functional electrical stimulation;Functional mobility training;Patient/family education;Splinting/orthotics;Therapeutic Exercise;Visual/perceptual remediation/compensation;UE/LE Coordination activities;Therapeutic Activities;Neuromuscular  re-education;Pain management;Psychosocial support;Stair training;UE/LE Strength taining/ROM;Wheelchair propulsion/positioning   Session 1: Pt seated in w/c upon PT arrival, agreeable to therapy tx and denies pain. Pt performed sit<>stand transfer using RW and min assist to pull up shorts over hips. Pt propelled w/c x 100 ft to the gym with max verbal cues for technique and min assist for steering. Pt performed 3 sit<>stands using RW and min assist, verbal cues for foot placement and facilitation for R LE weightbearing. Pt standing working on dynamic balance withouut UE support in order to match playing cards, alternating UEs to reach for cards, x 2 trials. PT ambulated 2 x 20 ft using RW and mod assist, facilitation for R hip flexion during swing and facilitation to prevent R knee hyperextension during stance phase. Pt left sitting in w/c at end of session with needs in reach and QRB in place.   Session 2: Pt seated in w/c upon PT arrival, agreeable to therapy tx and denies pain. Pt ambulates x 50 ft using RW and mod assist, facilitation for R hip flexion during swing and facilitation to prevent R knee hyperextension during stance phase. Pt ascended/descended 4 steps using B handrails and mod assist to help facilitate R LE advancement and control balance. Pt practiced squat pivot transfers to from mat x2 in each direction with min assist and max verbal cues for technique, facilitation for anterior weightshift. Pt transferred sitting<>supine with min assist. In supine pt performed 2 x 5 bridges and 2 x 10 LAQ for R LE neuromuscular re-ed. PT performed hamstring stretch, piriformis stretch and calf stretch x 1 minute each. Pt left in w/c at end of session with needs in reach and QRB in place.    Therapy Documentation Precautions:  Precautions Precautions: Fall Precaution Comments: R hemiparesis Restrictions Weight Bearing Restrictions: No   See Function Navigator for Current Functional  Status.  Therapy/Group:  Individual Therapy  Netta Corrigan, PT, DPT 10/09/2016, 2:00 PM

## 2016-10-09 NOTE — Progress Notes (Signed)
Occupational Therapy Weekly Progress Note  Patient Details  Name: Robert Buckley MRN: [id number] Date of Birth: 1945/07/15  Beginning of progress report period: October 02, 2016 End of progress report period: October 09, 2016  Today's Date: 10/09/2016 OT Individual Time: 9924-2683 OT Individual Time Calculation (min): 57 min    Patient has met 3 of 4 short term goals.  Pt is making steady progress with OT at this time.  He currently demonstrates sit to stand from the wheelchair with LB dressing tasks at min assist if he scoots to the edge, positions his RLE under him, and pushes off of the left arm of the chair.   Most of the time he does not remember to position the RLE under him resulting in increased left knee hyper extension and need for use of the chair on the back of his left leg to help him achieve standing.  Mod assist is neededfor functional mobility in the room, with use of the RW, when walking to the bathroom.  He demonstrates increased trunk and hip flexion as well as knee hyperextension during stance phase on the right side.  UB selfcare is at a supervision level overall at this time.  He continues to use the RUE as an active assist for washing the left arm with supervision and to assist with holding the RLE when crossed over the left knee for LB dressing tasks.  Goals are still supervision level and feel he can achieve these in another week of therapy.  Biggest concern at this time is the need for initial supervision from his sons at discharge.  Unsure if they will be able to provide this at this time.    Patient continues to demonstrate the following deficits: muscle weakness, impaired timing and sequencing, abnormal tone, unbalanced muscle activation and decreased coordination and decreased standing balance, decreased postural control, hemiplegia and decreased balance strategies and therefore will continue to benefit from skilled OT intervention to enhance overall performance with BADL  and Reduce care partner burden.  Patient progressing toward long term goals..  Continue plan of care.  OT Short Term Goals Week 2:  OT Short Term Goal 1 (Week 2): Continue working on established supervision level goals.  Skilled Therapeutic Interventions/Progress Updates:    Pt completed shower and dressing during session.  Mod assist for ambulation to the shower with use of the RW.  Excessive right knee hyper-extension is noted with weightbearing on the right leg.  He completed bathing at min assist level with mod instructional cueing to sequence.  Dressing completed at the sink with mod instructional cueing for hemi dressing techniques. Pt needing mod assist for reciprical scooting to the edge of chair on the right hip in order to position himself in a better spot for sit to stand.  Min assist for sit to stand when he completes this setup.  Pt left in wheelchair at end of session at bedside with safety belt in place and call button in reach.     Therapy Documentation Precautions:  Precautions Precautions: Fall Precaution Comments: R hemiparesis Restrictions Weight Bearing Restrictions: No  Pain: Pain Assessment Pain Assessment: No/denies pain ADL:  See Function Navigator for Current Functional Status.   Therapy/Group: Individual Therapy  [name] OTR/L 10/09/2016, 4:19 PM

## 2016-10-09 NOTE — Progress Notes (Signed)
Social Work Patient ID: Nitish Roes, male   DOB: August 31, 1945, 71 y.o.   MRN: [id number]  Met with pt to discuss team conference goals supervision-min assist level and target discharge 9/5. Discussed if son is willing to provide assistance in the bathroom-pt feels he can help him. He reports: " He's not working." Discussed would need for him to come in and attend therapies to learn His care prior to discharge. Will contact him and get him in for therapies. Pt aware of the other option of going to a NH for a short time.

## 2016-10-09 NOTE — Progress Notes (Signed)
Orthopedic Tech Progress Note Patient Details:  Sarajane JewsCharles Kohlenberg 04-02-1945 [id number] Called brace order  Patient ID: Sarajane Jewsharles Mcmurphy, male   DOB: 04-02-1945, 71 y.o.   MRN: [id number]   Jennye MoccasinHughes, Karlyn Glasco Craig 10/09/2016, 3:25 PM

## 2016-10-09 NOTE — Progress Notes (Signed)
Speech Language Pathology Daily Session Note  Patient Details  Name: Robert Buckley MRN: [id number] Date of Birth: January 10, 1946  Today's Date: 10/09/2016 SLP Individual Time: 0805-0900 SLP Individual Time Calculation (min): 55 min  Short Term Goals: Week 1: SLP Short Term Goal 1 (Week 1): Pt will use external memory aids to facilitate recall of daily information with min assist verbal cues.  SLP Short Term Goal 2 (Week 1): Pt will complete basic, familiar tasks with min assist verbal cues for functional problem solving.  SLP Short Term Goal 3 (Week 1): Pt will sustain his attention to a basic, familiar task for 15 minutes with supervision verbal cues for redirection.   SLP Short Term Goal 4 (Week 1): Pt will return demonstration of at least 3 safety precautions during functional tasks with min assist.    Skilled Therapeutic Interventions:   Pt was seen for skilled ST targeting cognitive goals.  Pt was laying in bed upon arrival but was agreeable to getting up for therapies.  Pt needed min question cues to anticipate toileting needs given that he reported had had not been to the bathroom yet this morning.  Min verbal cues were also needed for recall and use of safety precautions when getting on and off commode.  Therapist facilitated the session with a previously taught card game to address problem solving, recall, and attention.  Pt was able to recall rules and procedures of task with mod assist verbal cues.  He initially required mod assist to plan and execute a problem solving strategy; however, as task progressed and became more familiar, therapist was able to fade cues to min assist.  Pt sustained his attention to task for 20 minutes with no cues needed for redirection.  Pt was left in chair with quick release belt donned and call bell within reach.    Function:  Eating Eating                 Cognition Comprehension Comprehension assist level: Follows basic conversation/direction with  no assist  Expression   Expression assist level: Expresses basic needs/ideas: With extra time/assistive device  Social Interaction Social Interaction assist level: Interacts appropriately 90% of the time - Needs monitoring or encouragement for participation or interaction.  Problem Solving Problem solving assist level: Solves basic 75 - 89% of the time/requires cueing 10 - 24% of the time  Memory Memory assist level: Recognizes or recalls 50 - 74% of the time/requires cueing 25 - 49% of the time    Pain Pain Assessment Pain Assessment: No/denies pain  Therapy/Group: Individual Therapy  Kenlee Vogt, Melanee SpryNicole L 10/09/2016, 8:55 AM

## 2016-10-09 NOTE — Progress Notes (Signed)
Social Work Juana Haralson, Elveria Rising Social Worker Signed   Patient Care Conference Date of Service: 10/09/2016  4:10 PM      Hide copied text Hover for attribution information Inpatient RehabilitationTeam Conference and Plan of Care Update Date: 10/09/2016   Time: 2:20 PM      Patient Name: Robert Buckley      Medical Record Number: [id number]  Date of Birth: 05/24/1945 Sex: Male         Room/Bed: 4M10C/4M10C-01 Payor Info: Payor: MEDICARE / Plan: MEDICARE PART A AND B / Product Type: *No Product type* /     Admitting Diagnosis: CVA  Admit Date/Time:  10/01/2016  1:22 PM Admission Comments: No comment available    Primary Diagnosis:  Left sided cerebral hemisphere cerebrovascular accident (CVA) (HCC) Principal Problem: Left sided cerebral hemisphere cerebrovascular accident (CVA) [facility])       Patient Active Problem List    Diagnosis Date Noted  . Right spastic hemiparesis (HCC)    . Stage 3 chronic kidney disease    . Left sided cerebral hemisphere cerebrovascular accident (CVA) (HCC) 10/01/2016  . Right hemiparesis (HCC) 10/01/2016  . Acute CVA (cerebrovascular accident) (HCC) 09/30/2016  . Dyslipidemia 09/30/2016  . Hypomagnesemia 09/30/2016  . AKI (acute kidney injury) (HCC) 09/30/2016  . Hypokalemia 09/30/2016  . Fall at home, initial encounter 09/30/2016  . Ataxia 09/30/2016  . Essential hypertension    . UTI (urinary tract infection) 09/28/2016      Expected Discharge Date: Expected Discharge Date: 10/16/16   Team Members Present: Physician leading conference: Dr. Maryla Morrow Social Worker Present: Dossie Der, LCSW Nurse Present: Allayne Stack, RN PT Present: Woodfin Ganja, PT OT Present: Perrin Maltese, OT SLP Present: Jackalyn Lombard, SLP PPS Coordinator present : Tora Duck, RN, CRRN       Current Status/Progress Goal Weekly Team Focus  Medical     Right side weakness secondary to acute infarction left hemisphere/corona radiata. History of CVA 2010 with  right-sided weakness  Improve mobility, safety, renal function, BP  See above   Bowel/Bladder     Continent of bowel, incontinent of bladder, LBM 10/08/16  urinal use q3 hours while awake, less episodes of incontinence  timed toileting   Swallow/Nutrition/ Hydration               ADL's     min A dressing and bathing, min -mod A transfers and sit to stand skills with RW  Supervision overall  RUE NMR, balance, functional mobility, pt/ family education, ADL retraining   Mobility     Min assist for bed mobility and sit<>stand, Mod assist for transfers and Mod Assist for gait using RW only up to 25 ft  supervision for bed mobility and transfers, min assist for gait with RW up to 150 ft, supervision for w/c propulsion   tranfer training, gait, balance, w/c propulsion, R LE neuromuscular re-ed   Communication               Safety/Cognition/ Behavioral Observations   min-mod assist   supervision   basic problem solving, awareness, safety, memory   Pain     no c/o pain, no pain meds on MAR  pain scale <2  continue to assess & treat as needed   Skin     no skin break down  no new skin breakdown  assess q shift     *See Care Plan and progress notes for long and short-term goals.      Barriers  to Discharge   Current Status/Progress Possible Resolutions Date Resolved   Physician     Decreased caregiver support;Medical stability;Incontinence     See above  Therapies, optimize BP meds, consider bladder meds, follow labs      Nursing                 PT                    OT                 SLP            SW              Discharge Planning/Teaching Needs:  Unsure if going home if requires assist, does not have 24hr care. Son can assist some but not 24 hr      Team Discussion:  Goals supervision-min assist with ambulation. Incontinent with bowel and bladder-RN trying to work on pt has no awareness of this. AFO ordered. MD watching kidney function and pushing po fluids. Will need family  education prior to DC to see if son can do what is being asked of him.  Revisions to Treatment Plan:  DC 9/5 see if son will do pt's care    Continued Need for Acute Rehabilitation Level of Care: The patient requires daily medical management by a physician with specialized training in physical medicine and rehabilitation for the following conditions: Daily direction of a multidisciplinary physical rehabilitation program to ensure safe treatment while eliciting the highest outcome that is of practical value to the patient.: Yes Daily medical management of patient stability for increased activity during participation in an intensive rehabilitation regime.: Yes Daily analysis of laboratory values and/or radiology reports with any subsequent need for medication adjustment of medical intervention for : Neurological problems;Blood pressure problems;Urological problems   Tatjana Turcott, Lemar Livings 10/09/2016, 4:10 PM          Spero Gunnels, Lemar Livings, LCSW Social Worker Signed   Patient Care Conference Date of Service: 10/02/2016  3:01 PM      Hide copied text Hover for attribution information Inpatient RehabilitationTeam Conference and Plan of Care Update Date: 10/02/2016   Time: 10:30 AM      Patient Name: Robert Buckley      Medical Record Number: [id number]  Date of Birth: 1945-08-23 Sex: Male         Room/Bed: 4M10C/4M10C-01 Payor Info: Payor: MEDICARE / Plan: MEDICARE PART A AND B / Product Type: *No Product type* /     Admitting Diagnosis: CVA  Admit Date/Time:  10/01/2016  1:22 PM Admission Comments: No comment available    Primary Diagnosis:  Left sided cerebral hemisphere cerebrovascular accident (CVA) (HCC) Principal Problem: Left sided cerebral hemisphere cerebrovascular accident (CVA) [facility])       Patient Active Problem List    Diagnosis Date Noted  . Left sided cerebral hemisphere cerebrovascular accident (CVA) (HCC) 10/01/2016  . Right hemiparesis (HCC) 10/01/2016  . Acute CVA  (cerebrovascular accident) (HCC) 09/30/2016  . Dyslipidemia 09/30/2016  . Hypomagnesemia 09/30/2016  . AKI (acute kidney injury) (HCC) 09/30/2016  . Hypokalemia 09/30/2016  . Fall at home, initial encounter 09/30/2016  . Ataxia 09/30/2016  . Essential hypertension    . UTI (urinary tract infection) 09/28/2016      Expected Discharge Date:     Team Members Present: Physician leading conference: Dr. Claudette Laws Social Worker Present: Dossie Der, LCSW Nurse Present: Allayne Stack, RN PT Present:  Woodfin GanjaEmily Van Shagen, PT OT Present: Perrin MalteseJames McGuire, OT SLP Present: Jackalyn LombardNicole Page, SLP PPS Coordinator present : Tora DuckMarie Noel, RN, CRRN       Current Status/Progress Goal Weekly Team Focus  Medical       adjusting meds   medical stability     Bowel/Bladder     incont of b/b; able to sense urge to defecate but not to urinate; condom cath at night; LBM 8/21 per pt  urinal use q3 hours while awake  offer urinal q3hr while awake   Swallow/Nutrition/ Hydration     pending ST evaluation          ADL's       new eval        Mobility       new eval        Communication     pending ST evaluation         Safety/Cognition/ Behavioral Observations   pending ST evaluation         Pain     denies  3 out of 10 or less  assess q shift and prn   Skin     CDI  no new skin breakdown  assess q shift and prn     *See Care Plan and progress notes for long and short-term goals.      Barriers to Discharge   Current Status/Progress Possible Resolutions Date Resolved   Physician                    Nursing                 PT                    OT Decreased caregiver support               SLP            SW Decreased caregiver support Does not have 24 hr care per pt             Discharge Planning/Teaching Needs:    Home with tow son's who will not be there during the day-one works and other looking for a job.     Team Discussion:  Team evaluating pt and setting goals. Pt hopefully can  be mod/i level goals to be safe at home alone while they are at work  Revisions to Treatment Plan:  New eval      Nirvaan Frett, Lemar LivingsRebecca G 10/02/2016, 3:01 PM           Patient ID: Sarajane JewsCharles Dishner, male   DOB: [birth year], 71 y.o.   MRN: [id number]

## 2016-10-09 NOTE — Patient Care Conference (Signed)
Inpatient RehabilitationTeam Conference and Plan of Care Update Date: 10/09/2016   Time: 2:20 PM    Patient Name: Robert Buckley      Medical Record Number: [id number]  Date of Birth: Apr 28, 1945 Sex: Male         Room/Bed: 4M10C/4M10C-01 Payor Info: Payor: MEDICARE / Plan: MEDICARE PART A AND B / Product Type: *No Product type* /    Admitting Diagnosis: CVA  Admit Date/Time:  10/01/2016  1:22 PM Admission Comments: No comment available   Primary Diagnosis:  Left sided cerebral hemisphere cerebrovascular accident (CVA) (HCC) Principal Problem: Left sided cerebral hemisphere cerebrovascular accident (CVA) [facility])  Patient Active Problem List   Diagnosis Date Noted  . Right spastic hemiparesis (HCC)   . Stage 3 chronic kidney disease   . Left sided cerebral hemisphere cerebrovascular accident (CVA) (HCC) 10/01/2016  . Right hemiparesis (HCC) 10/01/2016  . Acute CVA (cerebrovascular accident) (HCC) 09/30/2016  . Dyslipidemia 09/30/2016  . Hypomagnesemia 09/30/2016  . AKI (acute kidney injury) (HCC) 09/30/2016  . Hypokalemia 09/30/2016  . Fall at home, initial encounter 09/30/2016  . Ataxia 09/30/2016  . Essential hypertension   . UTI (urinary tract infection) 09/28/2016    Expected Discharge Date: Expected Discharge Date: 10/16/16  Team Members Present: Physician leading conference: Dr. Maryla Morrow Social Worker Present: Dossie Der, LCSW Nurse Present: Allayne Stack, RN PT Present: Woodfin Ganja, PT OT Present: Perrin Maltese, OT SLP Present: Jackalyn Lombard, SLP PPS Coordinator present : Tora Duck, RN, CRRN     Current Status/Progress Goal Weekly Team Focus  Medical   Right side weakness secondary to acute infarction left hemisphere/corona radiata. History of CVA 2010 with right-sided weakness  Improve mobility, safety, renal function, BP  See above   Bowel/Bladder   Continent of bowel, incontinent of bladder, LBM 10/08/16  urinal use q3 hours while awake, less episodes of  incontinence  timed toileting   Swallow/Nutrition/ Hydration             ADL's   min A dressing and bathing, min -mod A transfers and sit to stand skills with RW  Supervision overall  RUE NMR, balance, functional mobility, pt/ family education, ADL retraining   Mobility   Min assist for bed mobility and sit<>stand, Mod assist for transfers and Mod Assist for gait using RW only up to 25 ft  supervision for bed mobility and transfers, min assist for gait with RW up to 150 ft, supervision for w/c propulsion   tranfer training, gait, balance, w/c propulsion, R LE neuromuscular re-ed   Communication             Safety/Cognition/ Behavioral Observations  min-mod assist   supervision   basic problem solving, awareness, safety, memory   Pain   no c/o pain, no pain meds on MAR  pain scale <2  continue to assess & treat as needed   Skin   no skin break down  no new skin breakdown  assess q shift      *See Care Plan and progress notes for long and short-term goals.     Barriers to Discharge  Current Status/Progress Possible Resolutions Date Resolved   Physician    Decreased caregiver support;Medical stability;Incontinence     See above  Therapies, optimize BP meds, consider bladder meds, follow labs      Nursing                  PT  OT                  SLP                SW                Discharge Planning/Teaching Needs:  Posey ReaUnsure if going home if requires assist, does not have 24hr care. Son can assist some but not 24 hr      Team Discussion:  Goals supervision-min assist with ambulation. Incontinent with bowel and bladder-RN trying to work on pt has no awareness of this. AFO ordered. MD watching kidney function and pushing po fluids. Will need family education prior to DC to see if son can do what is being asked of him.  Revisions to Treatment Plan:  DC 9/5 see if son will do pt's care    Continued Need for Acute Rehabilitation Level of Care: The patient  requires daily medical management by a physician with specialized training in physical medicine and rehabilitation for the following conditions: Daily direction of a multidisciplinary physical rehabilitation program to ensure safe treatment while eliciting the highest outcome that is of practical value to the patient.: Yes Daily medical management of patient stability for increased activity during participation in an intensive rehabilitation regime.: Yes Daily analysis of laboratory values and/or radiology reports with any subsequent need for medication adjustment of medical intervention for : Neurological problems;Blood pressure problems;Urological problems  Robert Buckley, Robert Buckley 10/09/2016, 4:10 PM

## 2016-10-10 ENCOUNTER — Inpatient Hospital Stay (HOSPITAL_COMMUNITY): Payer: Medicare Other | Admitting: Occupational Therapy

## 2016-10-10 ENCOUNTER — Inpatient Hospital Stay (HOSPITAL_COMMUNITY): Payer: Medicare Other | Admitting: Physical Therapy

## 2016-10-10 ENCOUNTER — Inpatient Hospital Stay (HOSPITAL_COMMUNITY): Payer: Medicare Other | Admitting: Speech Pathology

## 2016-10-10 DIAGNOSIS — N183 Chronic kidney disease, stage 3 (moderate): Secondary | ICD-10-CM

## 2016-10-10 DIAGNOSIS — G8111 Spastic hemiplegia affecting right dominant side: Secondary | ICD-10-CM

## 2016-10-10 NOTE — Progress Notes (Signed)
Occupational Therapy Session Note  Patient Details  Name: Robert JewsCharles Glaze MRN: [id number] Date of Birth: 06/12/1945  Today's Date: 10/10/2016 OT Individual Time: 1300-1345 OT Individual Time Calculation (min): 45 min    Short Term Goals: Week 2:  OT Short Term Goal 1 (Week 2): Continue working on established supervision level goals.  Skilled Therapeutic Interventions/Progress Updates:    Pt worked on functional transfers, scooting, and sit to stand transitions in the gym with emphasis on activation of the right trunk, forward weightshift, and positioning of the RLE.  Pt demonstrates increased difficulty with bilateral scooting or with reciprical scooting on the right side.  He still tends to use extensor patterns to initiate movement secondary to decreased forward trunk transition for sit to stand.  He needed min to mod assist for scooting forward to EOM, reciprically or bilaterally with min assist for sit to stand.  Still with increased trunk flexion in standing, decreased right hip extension and increased right knee hyper-extension secondary to the hip flexion.  Min assist for squat pivot transfer to wheelchair from mat when returning to room.  Pt left in wheelchair with call button in reach and safety belt in place.    Therapy Documentation Precautions:  Precautions Precautions: Fall Precaution Comments: R hemiparesis Restrictions Weight Bearing Restrictions: No  Pain: Pain Assessment Pain Assessment: No/denies pain ADL: See Function Navigator for Current Functional Status.   Therapy/Group: Individual Therapy  Samaiyah Howes OTR/L 10/10/2016, 4:00 PM

## 2016-10-10 NOTE — Progress Notes (Signed)
Physical Therapy Session Note  Patient Details  Name: Robert Buckley MRN: [id number] Date of Birth: 12/25/1945  Today's Date: 10/10/2016 PT Individual Time: 0900-1000 PT Individual Time Calculation (min): 60 min   Short Term Goals: Week 2:  PT Short Term Goal 1 (Week 2): STG=LTG due to ELOS  Skilled Therapeutic Interventions/Progress Updates:   Pt in w/c upon arrival and agreeable to therapy, no c/o pain. Pt put on pants w/ set-up A while sitting in w/c and he performed sit<>stand w/ Mod A using armrests and RW while PT assisted pt in pulling pants to hips. Pt had multiple attempts to stand secondary to decrease motor planning and LE strength.   W/c mobility, 40' w/ Max A for propulsion using bilateral LEs, multimodal cues for technique.   Worked on LE strength and NMR in parallel bars w/ emphasis on equal weight-shifting in stance and R quad activation. Tactile and manual cues throughout to prevent R knee hyperextention during tasks. Bilateral UE support while in bars w/ supervision for balance during tasks performed. Visual feedback during tasks w/ mirror. Sit<>stands w/ supervision, seated rest breaks secondary to fatigue.  -lateral weight shifts, 2x10 in both directions -1" R step up w/ tactile cues to control R quad activation, 2x5 -partial knee bends, 2x10 w/ unilateral UE support -L knee marches, 2x10 w/ unilateral UE support -NuStep 15 min @ L2, LEs only and verbal cues to attend to task  Ended session in w/c, call bell within reach and all needs met.   Therapy Documentation Precautions:  Precautions Precautions: Fall Precaution Comments: R hemiparesis Restrictions Weight Bearing Restrictions: No Pain: Pain Assessment Pain Assessment: No/denies pain  See Function Navigator for Current Functional Status.   Therapy/Group: Individual Therapy  Amy K Arnette 10/10/2016, 10:03 AM  

## 2016-10-10 NOTE — Progress Notes (Signed)
Subjective/Complaints: Pt seen laying in bed this AM.  He slept well overnight.  He states he is doing well.   ROS: Denies CP, SOB, N/V/D  Objective: Vital Signs: Blood pressure (!) 142/95, pulse 70, temperature 98.3 F (36.8 C), temperature source Oral, resp. rate 18, height _0  (1.702 m), weight 89.4 kg (197 lb 3.2 oz), SpO2 96 %. No results found. Results for orders placed or performed during the hospital encounter of 10/01/16 (from the past 72 hour(s))  Creatinine, serum     Status: Abnormal   Collection Time: 10/08/16  5:27 AM  Result Value Ref Range   Creatinine, Ser 1.78 (H) 0.61 - 1.24 mg/dL   GFR calc non Af Amer 37 (L) >60 mL/min   GFR calc Af Amer 43 (L) >60 mL/min    Comment: (NOTE) The eGFR has been calculated using the CKD EPI equation. This calculation has not been validated in all clinical situations. eGFR's persistently <60 mL/min signify possible Chronic Kidney Disease.   Basic metabolic panel     Status: Abnormal   Collection Time: 10/09/16  6:09 AM  Result Value Ref Range   Sodium 142 135 - 145 mmol/L   Potassium 4.1 3.5 - 5.1 mmol/L   Chloride 113 (H) 101 - 111 mmol/L   CO2 20 (L) 22 - 32 mmol/L   Glucose, Bld 106 (H) 65 - 99 mg/dL   BUN 22 (H) 6 - 20 mg/dL   Creatinine, Ser 1.68 (H) 0.61 - 1.24 mg/dL   Calcium 8.8 (L) 8.9 - 10.3 mg/dL   GFR calc non Af Amer 40 (L) >60 mL/min   GFR calc Af Amer 46 (L) >60 mL/min    Comment: (NOTE) The eGFR has been calculated using the CKD EPI equation. This calculation has not been validated in all clinical situations. eGFR's persistently <60 mL/min signify possible Chronic Kidney Disease.    Anion gap 9 5 - 15    Gen: NAD. Vital signs reviewed.  HEENT: normocephalic, atraumatic Cardio: RRR and no JVD Resp: CTA B/L and Unlabored GI: BS positive and ND Musc/Skel:  No edema. No tenderness. Neuro: Alert/Oriented x3. Motor: 4/5 right delt, bi, tri, grip. mAS 1/4 elbow flexors (unchanged) RLE: 3-/5 HF, KE, ADF/PF   Skin:   Intact. Warm and dry Psych: Normal mood and behavior  Assessment/Plan: 1. Functional deficits secondary to left corona radiata infarct which require 3+ hours per day of interdisciplinary therapy in a comprehensive inpatient rehab setting. Physiatrist is providing close team supervision and 24 hour management of active medical problems listed below. Physiatrist and rehab team continue to assess barriers to discharge/monitor patient progress toward functional and medical goals. FIM: Function - Bathing Position: Shower Body parts bathed by patient: Right arm, Chest, Abdomen, Front perineal area, Buttocks, Right upper leg, Left upper leg, Right lower leg, Left lower leg, Left arm Body parts bathed by helper: Back Assist Level: Touching or steadying assistance(Pt > 75%)  Function- Upper Body Dressing/Undressing What is the patient wearing?: Pull over shirt/dress Pull over shirt/dress - Perfomed by patient: Thread/unthread right sleeve, Thread/unthread left sleeve, Put head through opening, Pull shirt over trunk Pull over shirt/dress - Perfomed by helper: Pull shirt over trunk Assist Level: Supervision or verbal cues Function - Lower Body Dressing/Undressing What is the patient wearing?: Pants, Shoes, Non-skid slipper socks Position: Wheelchair/chair at sink Pants- Performed by patient: Thread/unthread right pants leg, Pull pants up/down, Thread/unthread left pants leg Pants- Performed by helper: Thread/unthread left pants leg Non-skid slipper socks- Performed  by patient: Don/doff right sock, Don/doff left sock Non-skid slipper socks- Performed by helper: Don/doff left sock Shoes - Performed by patient: Don/doff left shoe Shoes - Performed by helper: Don/doff right shoe, Fasten right, Fasten left TED Hose - Performed by helper: Don/doff right TED hose, Don/doff left TED hose Assist for footwear: Dependant Assist for lower body dressing: Touching or steadying assistance (Pt >  75%)  Function - Toileting Toileting activity did not occur: No continent bowel/bladder event Toileting steps completed by patient: Adjust clothing prior to toileting Toileting steps completed by helper: Adjust clothing prior to toileting, Performs perineal hygiene, Adjust clothing after toileting Toileting Assistive Devices: Grab bar or rail Assist level: Two helpers  Function - Air cabin crew transfer assistive device: Elevated toilet seat/BSC over toilet Assist level to toilet: Moderate assist (Pt 50 - 74%/lift or lower) Assist level from toilet: Moderate assist (Pt 50 - 74%/lift or lower) Assist level to bedside commode (at bedside): Moderate assist (Pt 50 - 74%/lift or lower) Assist level from bedside commode (at bedside): Moderate assist (Pt 50 - 74%/lift or lower)  Function - Chair/bed transfer Chair/bed transfer method: Stand pivot Chair/bed transfer assist level: Touching or steadying assistance (Pt > 75%) Chair/bed transfer assistive device: Armrests Chair/bed transfer details: Verbal cues for sequencing, Verbal cues for technique, Manual facilitation for weight shifting, Manual facilitation for placement  Function - Locomotion: Wheelchair Will patient use wheelchair at discharge?: Yes Type: Manual Max wheelchair distance: 25 Assist Level: Moderate assistance (Pt 50 - 74%) Wheel 50 feet with 2 turns activity did not occur: Safety/medical concerns Assist Level: Moderate assistance (Pt 50 - 74%) Wheel 150 feet activity did not occur: N/A Turns around,maneuvers to table,bed, and toilet,negotiates 3% grade,maneuvers on rugs and over doorsills: No Function - Locomotion: Ambulation Assistive device: Walker-rolling Max distance: 50 ft Assist level: Moderate assist (Pt 50 - 74%) Assist level: Moderate assist (Pt 50 - 74%) Walk 50 feet with 2 turns activity did not occur: Safety/medical concerns Assist level: Moderate assist (Pt 50 - 74%) Walk 150 feet activity did not  occur: Safety/medical concerns Walk 10 feet on uneven surfaces activity did not occur: Safety/medical concerns  Function - Comprehension Comprehension: Auditory Comprehension assist level: Follows basic conversation/direction with no assist  Function - Expression Expression: Verbal Expression assist level: Expresses basic needs/ideas: With no assist  Function - Social Interaction Social Interaction assist level: Interacts appropriately with others with medication or extra time (anti-anxiety, antidepressant).  Function - Problem Solving Problem solving assist level: Solves basic 75 - 89% of the time/requires cueing 10 - 24% of the time  Function - Memory Memory assist level: Recognizes or recalls 50 - 74% of the time/requires cueing 25 - 49% of the time Patient normally able to recall (first 3 days only): Current season, Staff names and faces, That he or she is in a hospital   Medical Problem List and Plan: 1. Right side weaknesssecondary to acute infarction left hemisphere/corona radiata. 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 DVT Prophylaxis/Anticoagulation: Subcutaneous Lovenox. Monitor for any bleeding episodes 3. Pain Management: Tylenol as needed 4. Mood: Provide emotional support 5. Neuropsych: This patient is not fully capable of making decisions on hisown behalf. 6. Skin/Wound Care: Routine skin checks 7. Fluids/Electrolytes/Nutrition: Routine I&Os  Eating 100% of meals 8.CKD 3  Cr 1.68 on 8/29  Encourage fluids 9.Escherichia coli urinary tract infection. Rocephin changed to Keflex completed 8/28 10.Hyperlipidemia. Lipitor 11.Hypertension. Monitor with increased mobility  Restarted norvasc 3m on  8/24 Vitals:   10/09/16 1500 10/10/16 0500  BP: 136/80 (!) 142/95  Pulse: 76 70  Resp: 18 18  Temp: 98.2 F (36.8 C) 98.3 F (36.8 C)  SpO2: 99% 96%   Overall controlled 8/30  Cont to monitor 12.BPH. Proscar -up out of bed to  void when possible   13.  Hypokalemia: Resolved  K+ 4.1 on 8/29  Cont to monitor  LOS (Days) 9 A FACE TO FACE EVALUATION WAS PERFORMED  Leven Hoel Lorie Phenix 10/10/2016, 8:34 AM

## 2016-10-10 NOTE — Progress Notes (Signed)
Speech Language Pathology Weekly Progress and Session Note  Patient Details  Name: Robert Buckley MRN: [id number] Date of Birth: [birth year]  Beginning of progress report period: October 02, 2016  End of progress report period: October 10, 2016  Today's Date: 10/10/2016 SLP Individual Time: 0833-0900 SLP Individual Time Calculation (min): 27 min  Short Term Goals: Week 1: SLP Short Term Goal 1 (Week 1): Pt will use external memory aids to facilitate recall of daily information with min assist verbal cues.  SLP Short Term Goal 1 - Progress (Week 1): Met SLP Short Term Goal 2 (Week 1): Pt will complete basic, familiar tasks with min assist verbal cues for functional problem solving.  SLP Short Term Goal 2 - Progress (Week 1): Met SLP Short Term Goal 3 (Week 1): Pt will sustain his attention to a basic, familiar task for 15 minutes with supervision verbal cues for redirection.   SLP Short Term Goal 3 - Progress (Week 1): Met SLP Short Term Goal 4 (Week 1): Pt will return demonstration of at least 3 safety precautions during functional tasks with min assist.   SLP Short Term Goal 4 - Progress (Week 1): Progressing toward goal    New Short Term Goals: Week 2: SLP Short Term Goal 1 (Week 2): Pt will use external memory aids to facilitate recall of daily information with supervision verbal cues.  SLP Short Term Goal 2 (Week 2): Pt will complete basic, familiar tasks with supervision verbal cues for functional problem solving.  SLP Short Term Goal 3 (Week 2): Pt will sustain his attention to a basic, familiar task for 30 minutes with supervision verbal cues for redirection.   SLP Short Term Goal 4 (Week 2): Pt will return demonstration of at least 3 safety precautions during functional tasks with min assist.    Weekly Progress Updates:    Pt has made functional gains this reporting period and has met 3 out of 4 short term goals.  Pt has demonstrated improved attention to tasks, basic problem  solving, and use of simple memory aids for recall of daily information.  He is currently a min assist for basic tasks due to mild cognitive impairment.  No family has been present for training at this time.  As a result, pt would continue to benefit from skilled ST while inpatient in order to maximize functional independence and reduce burden of care prior to discharge.     Intensity: Minumum of 1-2 x/day, 30 to 90 minutes Frequency: 3 to 5 out of 7 days Duration/Length of Stay: 18-21 days  Treatment/Interventions: Cognitive remediation/compensation;Cueing hierarchy;Functional tasks;Environmental controls;Internal/external aids;Patient/family education   Daily Session  Skilled Therapeutic Interventions: Pt was seen for skilled ST targeting cognitive goals.  Pt was in bed upon arrival but was agreeable to getting up for therapies.  Pt had better anticipation of toileting needs in comparison to previous therapy sessions.  He had already been incontinent of urine upon therapist's arrival but was able to successfully have a bowel movement while seated on commode.  Pt did need mod assist multimodal cues for safety due to impulsivity and poor awareness during mobility.  Pt was left in wheelchair with quick release belt donned and call bell within reach.  Goals updated on this date to reflect current progress and plan of care.       Function:   Eating Eating                 Cognition Comprehension Comprehension assist level: Follows basic  conversation/direction with no assist  Expression   Expression assist level: Expresses basic needs/ideas: With no assist  Social Interaction Social Interaction assist level: Interacts appropriately with others with medication or extra time (anti-anxiety, antidepressant).  Problem Solving Problem solving assist level: Solves basic 75 - 89% of the time/requires cueing 10 - 24% of the time  Memory Memory assist level: Recognizes or recalls 50 - 74% of the  time/requires cueing 25 - 49% of the time   General    Pain Pain Assessment Pain Assessment: No/denies pain  Therapy/Group: Individual Therapy  Lamount Bankson, Selinda Orion 10/10/2016, 9:24 AM

## 2016-10-10 NOTE — Progress Notes (Signed)
Occupational Therapy Session Note  Patient Details  Name: Robert Buckley MRN: [id number] Date of Birth: 06/25/1945  Today's Date: 10/10/2016 OT Individual Time: [id number] OT Individual Time Calculation (min): 56 min    Short Term Goals: Week 2:  OT Short Term Goal 1 (Week 2): Continue working on established supervision level goals.  Skilled Therapeutic Interventions/Progress Updates:    Pt declined shower today but agreed to complete washup at the sink.  He was able to complete UB bathing with supervision as well as UB dressing.  LB bathing and dressing at min assist level.  Pt still needs mod assist for reciprical scooting of the right hip prior to attempting standing, as well as mod instructional cueing to position the right foot under him to assist with sit to stand.  Decreased forward trunk flexion is also noted when attempting sit to stand resulting in posterior lean and decreased efficiency.  Once finished with bathing pt began working on lunch with primary use of the LUE to self feed and use of the RUE as a stabilizer when needed.  Pt left in wheelchair with call button and phone in reach and safety belt in place.    Therapy Documentation Precautions:  Precautions Precautions: Fall Precaution Comments: R hemiparesis Restrictions Weight Bearing Restrictions: No  Pain: Pain Assessment Pain Assessment: No/denies pain Pain Score: 0-No pain ADL: See Function Navigator for Current Functional Status.   Therapy/Group: Individual Therapy  Marlyn Rabine OTR/L 10/10/2016, 11:59 AM

## 2016-10-11 ENCOUNTER — Inpatient Hospital Stay (HOSPITAL_COMMUNITY): Payer: Medicare Other | Admitting: Occupational Therapy

## 2016-10-11 ENCOUNTER — Encounter (HOSPITAL_COMMUNITY): Payer: Self-pay

## 2016-10-11 ENCOUNTER — Inpatient Hospital Stay (HOSPITAL_COMMUNITY): Payer: Medicare Other | Admitting: Speech Pathology

## 2016-10-11 ENCOUNTER — Inpatient Hospital Stay (HOSPITAL_COMMUNITY): Payer: Medicare Other | Admitting: Physical Therapy

## 2016-10-11 DIAGNOSIS — I10 Essential (primary) hypertension: Secondary | ICD-10-CM

## 2016-10-11 NOTE — Progress Notes (Signed)
Subjective/Complaints: Pt seen sitting in his chair this AM.  He slept well overnight.  He notes improvement in his RLE.  ROS: Denies CP, SOB, N/V/D  Objective: Vital Signs: Blood pressure 140/83, pulse 70, temperature 97.8 F (36.6 C), temperature source Oral, resp. rate 19, height '5\' 7"'  (1.702 m), weight 89.4 kg (197 lb 3.2 oz), SpO2 98 %. No results found. Results for orders placed or performed during the hospital encounter of 10/01/16 (from the past 72 hour(s))  Basic metabolic panel     Status: Abnormal   Collection Time: 10/09/16  6:09 AM  Result Value Ref Range   Sodium 142 135 - 145 mmol/L   Potassium 4.1 3.5 - 5.1 mmol/L   Chloride 113 (H) 101 - 111 mmol/L   CO2 20 (L) 22 - 32 mmol/L   Glucose, Bld 106 (H) 65 - 99 mg/dL   BUN 22 (H) 6 - 20 mg/dL   Creatinine, Ser 1.68 (H) 0.61 - 1.24 mg/dL   Calcium 8.8 (L) 8.9 - 10.3 mg/dL   GFR calc non Af Amer 40 (L) >60 mL/min   GFR calc Af Amer 46 (L) >60 mL/min    Comment: (NOTE) The eGFR has been calculated using the CKD EPI equation. This calculation has not been validated in all clinical situations. eGFR's persistently <60 mL/min signify possible Chronic Kidney Disease.    Anion gap 9 5 - 15    Gen: NAD. Vital signs reviewed.  HEENT: normocephalic, atraumatic Cardio: RRR and no JVD Resp: CTA B/L and Unlabored GI: BS positive and ND Musc/Skel:  No edema. No tenderness. Neuro: Alert/Oriented x3. Motor: 4/5 right delt, bi, tri, grip. mAS 1/4 elbow flexors  RLE: 4-/5 HF, KE, 3+/5 ADF/PF  Skin:   Intact. Warm and dry Psych: Normal mood and behavior  Assessment/Plan: 1. Functional deficits secondary to left corona radiata infarct which require 3+ hours per day of interdisciplinary therapy in a comprehensive inpatient rehab setting. Physiatrist is providing close team supervision and 24 hour management of active medical problems listed below. Physiatrist and rehab team continue to assess barriers to discharge/monitor patient  progress toward functional and medical goals. FIM: Function - Bathing Position: Wheelchair/chair at sink Body parts bathed by patient: Right arm, Left arm, Chest, Abdomen, Front perineal area, Buttocks, Right upper leg, Left upper leg, Right lower leg, Left lower leg Body parts bathed by helper: Back Assist Level: Touching or steadying assistance(Pt > 75%)  Function- Upper Body Dressing/Undressing What is the patient wearing?: Pull over shirt/dress Pull over shirt/dress - Perfomed by patient: Thread/unthread right sleeve, Thread/unthread left sleeve, Put head through opening, Pull shirt over trunk Pull over shirt/dress - Perfomed by helper: Pull shirt over trunk Assist Level: Set up Function - Lower Body Dressing/Undressing What is the patient wearing?: Pants, Non-skid slipper socks, Ted Hose Position: Wheelchair/chair at [employer]- Performed by patient: Thread/unthread right pants leg, Pull pants up/down, Thread/unthread left pants leg Pants- Performed by helper: Thread/unthread left pants leg Non-skid slipper socks- Performed by patient: Don/doff right sock, Don/doff left sock Non-skid slipper socks- Performed by helper: Don/doff left sock Shoes - Performed by patient: Don/doff left shoe Shoes - Performed by helper: Don/doff right shoe, Fasten right, Fasten left TED Hose - Performed by helper: Don/doff right TED hose, Don/doff left TED hose Assist for footwear: Dependant Assist for lower body dressing: Touching or steadying assistance (Pt > 75%)  Function - Toileting Toileting activity did not occur: No continent bowel/bladder event Toileting steps completed by patient: Adjust clothing  prior to toileting Toileting steps completed by helper: Adjust clothing prior to toileting, Performs perineal hygiene, Adjust clothing after toileting Toileting Assistive Devices: Grab bar or rail Assist level: Two helpers  Function - Air cabin crew transfer assistive device: Elevated  toilet seat/BSC over toilet Assist level to toilet: Moderate assist (Pt 50 - 74%/lift or lower) Assist level from toilet: Moderate assist (Pt 50 - 74%/lift or lower) Assist level to bedside commode (at bedside): Moderate assist (Pt 50 - 74%/lift or lower) Assist level from bedside commode (at bedside): Moderate assist (Pt 50 - 74%/lift or lower)  Function - Chair/bed transfer Chair/bed transfer method: Stand pivot Chair/bed transfer assist level: Touching or steadying assistance (Pt > 75%) Chair/bed transfer assistive device: Armrests Chair/bed transfer details: Verbal cues for sequencing, Verbal cues for technique, Manual facilitation for weight shifting, Manual facilitation for placement  Function - Locomotion: Wheelchair Will patient use wheelchair at discharge?: Yes Type: Manual Max wheelchair distance: 40' Assist Level: Maximal assistance (Pt 25 - 49%) Wheel 50 feet with 2 turns activity did not occur: Safety/medical concerns Assist Level: Moderate assistance (Pt 50 - 74%) Wheel 150 feet activity did not occur: N/A Turns around,maneuvers to table,bed, and toilet,negotiates 3% grade,maneuvers on rugs and over doorsills: No Function - Locomotion: Ambulation Assistive device: Walker-rolling Max distance: 50 ft Assist level: Moderate assist (Pt 50 - 74%) Assist level: Moderate assist (Pt 50 - 74%) Walk 50 feet with 2 turns activity did not occur: Safety/medical concerns Assist level: Moderate assist (Pt 50 - 74%) Walk 150 feet activity did not occur: Safety/medical concerns Walk 10 feet on uneven surfaces activity did not occur: Safety/medical concerns  Function - Comprehension Comprehension: Auditory Comprehension assist level: Follows basic conversation/direction with no assist  Function - Expression Expression: Verbal Expression assist level: Expresses basic needs/ideas: With no assist  Function - Social Interaction Social Interaction assist level: Interacts appropriately  with others with medication or extra time (anti-anxiety, antidepressant).  Function - Problem Solving Problem solving assist level: Solves basic 75 - 89% of the time/requires cueing 10 - 24% of the time  Function - Memory Memory assist level: Recognizes or recalls 50 - 74% of the time/requires cueing 25 - 49% of the time Patient normally able to recall (first 3 days only): Current season, Staff names and faces, That he or she is in a hospital   Medical Problem List and Plan: 1. Right side weaknesssecondary to acute infarction left hemisphere/corona radiata. 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 DVT Prophylaxis/Anticoagulation: Subcutaneous Lovenox. Monitor for any bleeding episodes 3. Pain Management: Tylenol as needed 4. Mood: Provide emotional support 5. Neuropsych: This patient is not fully capable of making decisions on hisown behalf. 6. Skin/Wound Care: Routine skin checks 7. Fluids/Electrolytes/Nutrition: Routine I&Os  Eating 100% of meals 8.CKD 3  Cr 1.68 on 8/29  Labs ordered for Monday  Encourage fluids 9.Escherichia coli urinary tract infection. Rocephin changed to Keflex completed 8/28 10.Hyperlipidemia. Lipitor 11.Hypertension. Monitor with increased mobility  Restarted norvasc 33m on 8/24 Vitals:   10/10/16 1349 10/11/16 0555  BP: 123/76 140/83  Pulse: 84 70  Resp: 18 19  Temp: 97.9 F (36.6 C) 97.8 F (36.6 C)  SpO2: 96% 98%   Overall controlled 8/31  Cont to monitor 12.BPH. Proscar -up out of bed to void when possible   13.  Hypokalemia: Resolved  K+ 4.1 on 8/29  Cont to monitor  LOS (Days) 10 A FACE TO FACE EVALUATION WAS PERFORMED  Ankit AMaurene Capes  Patel 10/11/2016, 8:36 AM

## 2016-10-11 NOTE — Progress Notes (Signed)
Occupational Therapy Session Note  Patient Details  Name: Robert JewsCharles Vanderheyden MRN: [id number] Date of Birth: 1945-05-27  Today's Date: 10/11/2016 OT Individual Time: 1000-1059 OT Individual Time Calculation (min): 59 min    Short Term Goals: Week 2:  OT Short Term Goal 1 (Week 2): Continue working on established supervision level goals.  Skilled Therapeutic Interventions/Progress Updates:    Pt completed bathing at shower level.  Mod assist to ambulate to the shower bench from wheelchair at bedside, with use of the RW.  Increased right LE hyper-extension and decreased step length noted.  He was able to complete shower sit to stand with min assist and min instructional cueing to sequence.  He then transferred out to the wheelchair for grooming and dressing tasks.  Min assist for LB dressing sit to stand.  Pt needing mod instructional cueing and mod assist to scoot forward to the edge of the chair and position the RLE for sit to stand.  Once this is completed he can perform sit to stand with min assist when pulling brief and pants over hips.  Still with min assist to pull up the right side however.  He was able to donn gripper socks and shirt with supervision as well after therapist assisted with TEDs.  Pt left in wheelchair at end of session with call button and phone in reach and safety belt in place.    Therapy Documentation Precautions:  Precautions Precautions: Fall Precaution Comments: R hemiparesis Restrictions Weight Bearing Restrictions: No   Pain: Pain Assessment Pain Assessment: No/denies pain ADL: See Function Navigator for Current Functional Status.   Therapy/Group: Individual Therapy  Jibri Schriefer OTR/L 10/11/2016, 12:40 PM

## 2016-10-11 NOTE — Progress Notes (Signed)
Occupational Therapy Session Note  Patient Details  Name: Robert Buckley: [id number] Date of Birth: [birth year]  Today's Date: 10/11/2016 OT Individual Time: 1449-1531 OT Individual Time Calculation (min): 42 min    Short Term Goals: Week 2:  OT Short Term Goal 1 (Week 2): Continue working on established supervision level goals.  Skilled Therapeutic Interventions/Progress Updates:    Pt transferred down to the therapy gym for session with therapist assist.  Worked on lateral weightshifts to the left to increase right trunk activation.  Had him complete functional reaching to the left with the LUE while trying to get him to actively shorten his right trunk, while pushing with the RUE.  Pt unable to coordinate functional pushing through the arm in order to increase hip hike.  Transitioned to reciprical scooting with min assist overall on the right side.  Also worked on transitioning from leaning on the right forearm back to sitting with activation of the right trunk and RUE with mod assist.  Pt completed sit to stand with min assist as well, emphasizing forward weight shift.   Finished session with stand pivot transfer back to the left and mod assist, without assistive device.  Returned to room at end of session with pt left in wheelchair with safety belt in place and call button in place.    Therapy Documentation Precautions:  Precautions Precautions: Fall Precaution Comments: R hemiparesis Restrictions Weight Bearing Restrictions: No  Pain: Pain Assessment Pain Assessment: No/denies pain ADL: See Function Navigator for Current Functional Status.   Therapy/Group: Individual Therapy  Maddi Collar OTR/L 10/11/2016, 4:29 PM

## 2016-10-11 NOTE — Progress Notes (Signed)
Social Work Patient ID: Robert Buckley, male   DOB: 13-Sep-1945, 71 y.o.   MRN: [id number]  Met with pt and his two son's to discuss discharge plan and who would be providing care at discharge. They were able to see some of his PT session. Both feel he needs to go to a NH for a short time then home. Son who is not working is trying to get a job and if he does he will not be able To provide care to pt. Pt need sot get to a level where he can be mod/i at least toilet himself while his son's are at work. Have given them NH list and they plan to visit. Will begin the NH process.

## 2016-10-11 NOTE — Progress Notes (Signed)
Speech Language Pathology Daily Session Note  Patient Details  Name: Robert JewsCharles Flaten MRN: [id number] Date of Birth: May 03, 1945  Today's Date: 10/11/2016 SLP Individual Time: 0830-0930 SLP Individual Time Calculation (min): 60 min  Short Term Goals: Week 2: SLP Short Term Goal 1 (Week 2): Pt will use external memory aids to facilitate recall of daily information with supervision verbal cues.  SLP Short Term Goal 2 (Week 2): Pt will complete basic, familiar tasks with supervision verbal cues for functional problem solving.  SLP Short Term Goal 3 (Week 2): Pt will sustain his attention to a basic, familiar task for 30 minutes with supervision verbal cues for redirection.   SLP Short Term Goal 4 (Week 2): Pt will return demonstration of at least 3 safety precautions during functional tasks with min assist.    Skilled Therapeutic Interventions: Skilled treatment session focused on cognition goals. SLP facilitated session by providing Min A faded to supervision cues for use of external aids to recall therapy schedule and 1 therapy activity from previous day. Pt required Min A cues to complete basic/simple medication management task. He was able to sustain attention to task in mildly distracting environment with Mod I for ~ 45 minutes. Finally pt able to recall and demonstrate 3 safety precautions with question cues. Pt was returned to room, left upright in wheelchair, safety belt donned and all needs within reach. Continue per current plan of care.      Function:   Cognition Comprehension Comprehension assist level: Follows basic conversation/direction with no assist  Expression   Expression assist level: Expresses basic needs/ideas: With no assist  Social Interaction Social Interaction assist level: Interacts appropriately with others with medication or extra time (anti-anxiety, antidepressant).  Problem Solving Problem solving assist level: Solves basic 75 - 89% of the time/requires cueing 10 -  24% of the time  Memory Memory assist level: Recognizes or recalls 50 - 74% of the time/requires cueing 25 - 49% of the time    Pain    Therapy/Group: Individual Therapy  Yael Coppess 10/11/2016, 9:21 AM

## 2016-10-11 NOTE — Progress Notes (Signed)
Physical Therapy Session Note  Patient Details  Name: Robert Buckley MRN: [id number] Date of Birth: [birth year]  Today's Date: 10/11/2016 PT Individual Time: 1300-1400 PT Individual Time Calculation (min): 60 min   Short Term Goals: Week 2:  PT Short Term Goal 1 (Week 2): STG=LTG due to ELOS  Skilled Therapeutic Interventions/Progress Updates:   Pt in w/c upon arrival and agreeable to therapy, no c/o pain.   Worked on [employer] during transfers and sit<>stands. Sit<>stand w/o UE assist and w/ LUE over RUE technique to facilitate RLE weight-bearing and activation. Sit<>stands w/ LUE reaching across midline to perform task w/ emphasis on RLE weight-bearing. Close supervision to Min guard to achieve task. Worked on scooting to edge of chair on R side to assist w/ transfers, Max A for manual cues to facilitate scooting.   Orthotist present to evaluate RAFO during gait. R posterior leaf spring AFO provided to pt w/ heel wedge in shoe. Pt w/ significantly decreased R extension thrust in stance and demonstrates increased R knee musculature control in all phases of gait w/ AFO on. Ambulated 20' and 55' w/ RW and RAFO.   Ended session in w/c and in care of sons, all needs met.   Therapy Documentation Precautions:  Precautions Precautions: Fall Precaution Comments: R hemiparesis Restrictions Weight Bearing Restrictions: No Pain: Pain Assessment Pain Assessment: No/denies pain  See Function Navigator for Current Functional Status.   Therapy/Group: Individual Therapy  Asiel Chrostowski K Arnette 10/11/2016, 4:54 PM

## 2016-10-12 ENCOUNTER — Inpatient Hospital Stay (HOSPITAL_COMMUNITY): Payer: Medicare Other

## 2016-10-12 MED ORDER — AMLODIPINE BESYLATE 2.5 MG PO TABS
2.5000 mg | ORAL_TABLET | Freq: Once | ORAL | Status: AC
  Administered 2016-10-12: 2.5 mg via ORAL
  Filled 2016-10-12: qty 1

## 2016-10-12 MED ORDER — AMLODIPINE BESYLATE 5 MG PO TABS
7.5000 mg | ORAL_TABLET | Freq: Every day | ORAL | Status: DC
  Administered 2016-10-13 – 2016-10-16 (×4): 7.5 mg via ORAL
  Filled 2016-10-12 (×4): qty 1

## 2016-10-12 NOTE — Progress Notes (Signed)
Occupational Therapy Session Note  Patient Details  Name: Robert Buckley MRN: [id number] Date of Birth: 1945/09/07  Today's Date: 10/12/2016 OT Individual Time: 0397-9536 OT Individual Time Calculation (min): 43 min    Short Term Goals: Week 1:  OT Short Term Goal 1 (Week 1): Pt will complete all bathing with min assist sit to stand.  OT Short Term Goal 1 - Progress (Week 1): Met OT Short Term Goal 2 (Week 1): Pt will complete LB dressing with mod assist for donning pants and gripper socks. OT Short Term Goal 2 - Progress (Week 1): Met OT Short Term Goal 3 (Week 1): Pt will complete toilet transfers stand pivot with min assist to the 3:1 using the RW.  OT Short Term Goal 3 - Progress (Week 1): Not met OT Short Term Goal 4 (Week 1): Pt will demonstrate RUE use at a active assist level with supervision for washing the left arm during bathing tasks.  OT Short Term Goal 4 - Progress (Week 1): Met  Skilled Therapeutic Interventions/Progress Updates:    1;1. Pt requesting to bathe and dress this afternoon. Pt bathes at sit to stand level at sink with touching A for washing back and buttocks. All other bathing done with supervision and min question cueing for sequencing bathing body parts. Pt dresses at sit to stand level with touching A for advancing pants past hips. Pt able to assume seated figure four for donning B socks and threading B pant legs. Pt dons shirt with set up. Pt requires Vc for safety awareness when reaching forward to pick object up off floor as w/c breaks are not locked. Pt declines donning shoes and says "it is a waste of time. I cant do it." Exited session with pt seated in w/c with call light in reach and all needs met  Therapy Documentation Precautions:  Precautions Precautions: Fall Precaution Comments: R hemiparesis Restrictions Weight Bearing Restrictions: No  See Function Navigator for Current Functional Status.   Therapy/Group: Individual Therapy  [name] 10/12/2016, 3:25 PM

## 2016-10-12 NOTE — Plan of Care (Signed)
Problem: RH BLADDER ELIMINATION Goal: RH STG MANAGE BLADDER WITH ASSISTANCE STG Manage Bladder With mod Assistance   Outcome: Not Progressing Pt continues to have incontinent episodes.

## 2016-10-12 NOTE — Plan of Care (Signed)
Problem: RH SAFETY Goal: RH STG DEMO UNDERSTANDING HOME SAFETY PRECAUTIONS W/ mod assist  Outcome: Progressing Anticipated that Pt will discharge to nursing home

## 2016-10-12 NOTE — Progress Notes (Signed)
Subjective/Complaints: Patient seen lying in bed this morning. He states he slept well overnight. He notes his right lower extremity is improving slowly.  ROS: Denies CP, SOB, N/V/D  Objective: Vital Signs: Blood pressure (!) 148/73, pulse 66, temperature 97.6 F (36.4 C), temperature source Oral, resp. rate 18, height 5\' 7"  (1.702 m), weight 89.4 kg (197 lb 3.2 oz), SpO2 100 %. No results found. No results found for this or any previous visit (from the past 72 hour(s)).   Physical exam: Gen: NAD. Vital signs reviewed.  HEENT: normocephalic, atraumatic Cardio: RRR and no JVD Resp: CTA B/L and Unlabored GI: BS positive and ND Musc/Skel:  No edema. No tenderness. Neuro: Alert/Oriented. Motor: 4/5 right delt, bi, tri, grip. mAS 1/4 elbow flexors  RLE: 4/5 HF, KE, 4-/5 ADF/PF  Skin:   Intact. Warm and dry Psych: Normal mood and behavior  Assessment/Plan: 1. Functional deficits secondary to left corona radiata infarct which require 3+ hours per day of interdisciplinary therapy in a comprehensive inpatient rehab setting. Physiatrist is providing close team supervision and 24 hour management of active medical problems listed below. Physiatrist and rehab team continue to assess barriers to discharge/monitor patient progress toward functional and medical goals. FIM: Function - Bathing Position: Shower Body parts bathed by patient: Right arm, Left arm, Chest, Abdomen, Front perineal area, Buttocks, Right upper leg, Left upper leg, Right lower leg, Left lower leg Body parts bathed by helper: Back Assist Level: Touching or steadying assistance(Pt > 75%)  Function- Upper Body Dressing/Undressing What is the patient wearing?: Pull over shirt/dress Pull over shirt/dress - Perfomed by patient: Thread/unthread right sleeve, Thread/unthread left sleeve, Put head through opening, Pull shirt over trunk Pull over shirt/dress - Perfomed by helper: Pull shirt over trunk Assist Level: Set  up Function - Lower Body Dressing/Undressing What is the patient wearing?: Pants, Non-skid slipper socks, Ted Hose Position: Wheelchair/chair at [employer]- Performed by patient: Thread/unthread right pants leg, Pull pants up/down, Thread/unthread left pants leg Pants- Performed by helper: Thread/unthread left pants leg Non-skid slipper socks- Performed by patient: Don/doff right sock, Don/doff left sock Non-skid slipper socks- Performed by helper: Don/doff left sock Shoes - Performed by patient: Don/doff left shoe Shoes - Performed by helper: Don/doff right shoe, Fasten right, Fasten left TED Hose - Performed by helper: Don/doff right TED hose, Don/doff left TED hose Assist for footwear: Dependant Assist for lower body dressing: Touching or steadying assistance (Pt > 75%)  Function - Toileting Toileting activity did not occur: No continent bowel/bladder event Toileting steps completed by patient: Adjust clothing prior to toileting Toileting steps completed by helper: Adjust clothing prior to toileting, Performs perineal hygiene, Adjust clothing after toileting Toileting Assistive Devices: Grab bar or rail Assist level: Two helpers  Function - Archivist transfer assistive device: Elevated toilet seat/BSC over toilet Assist level to toilet: Moderate assist (Pt 50 - 74%/lift or lower) Assist level from toilet: Moderate assist (Pt 50 - 74%/lift or lower) Assist level to bedside commode (at bedside): Moderate assist (Pt 50 - 74%/lift or lower) Assist level from bedside commode (at bedside): Moderate assist (Pt 50 - 74%/lift or lower)  Function - Chair/bed transfer Chair/bed transfer method: Stand pivot Chair/bed transfer assist level: Touching or steadying assistance (Pt > 75%) Chair/bed transfer assistive device: Armrests Chair/bed transfer details: Verbal cues for sequencing, Verbal cues for technique, Manual facilitation for weight shifting, Manual facilitation for  placement  Function - Locomotion: Wheelchair Will patient use wheelchair at discharge?: Yes Type: Manual Max  wheelchair distance: 3940' Assist Level: Maximal assistance (Pt 25 - 49%) Wheel 50 feet with 2 turns activity did not occur: Safety/medical concerns Assist Level: Moderate assistance (Pt 50 - 74%) Wheel 150 feet activity did not occur: N/A Turns around,maneuvers to table,bed, and toilet,negotiates 3% grade,maneuvers on rugs and over doorsills: No Function - Locomotion: Ambulation Assistive device: Walker-rolling Max distance: 7155' Assist level: Touching or steadying assistance (Pt > 75%) Assist level: Touching or steadying assistance (Pt > 75%) Walk 50 feet with 2 turns activity did not occur: Safety/medical concerns Assist level: Touching or steadying assistance (Pt > 75%) Walk 150 feet activity did not occur: Safety/medical concerns Walk 10 feet on uneven surfaces activity did not occur: Safety/medical concerns  Function - Comprehension Comprehension: Auditory Comprehension assist level: Follows basic conversation/direction with no assist  Function - Expression Expression: Verbal Expression assist level: Expresses basic needs/ideas: With no assist  Function - Social Interaction Social Interaction assist level: Interacts appropriately with others with medication or extra time (anti-anxiety, antidepressant).  Function - Problem Solving Problem solving assist level: Solves basic 75 - 89% of the time/requires cueing 10 - 24% of the time  Function - Memory Memory assist level: Recognizes or recalls 50 - 74% of the time/requires cueing 25 - 49% of the time Patient normally able to recall (first 3 days only): Current season, Staff names and faces, That he or she is in a hospital   Medical Problem List and Plan: 1. Right side weaknesssecondary to acute infarction left hemisphere/corona radiata. 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 DVT  Prophylaxis/Anticoagulation: Subcutaneous Lovenox. Monitor for any bleeding episodes 3. Pain Management: Tylenol as needed 4. Mood: Provide emotional support 5. Neuropsych: This patient is not fully capable of making decisions on hisown behalf. 6. Skin/Wound Care: Routine skin checks 7. Fluids/Electrolytes/Nutrition: Routine I&Os  Eating 100 % of meals 8.CKD 3  Cr 1.68 on 8/29  Labs ordered for Monday  Encourage fluids 9.Escherichia coli urinary tract infection. Rocephin changed to Keflex completed 8/28 10.Hyperlipidemia. Lipitor 11.Hypertension. Monitor with increased mobility  Restarted norvasc 5mg  on 8/24, increased to 7.5 on 9/1 Vitals:   10/11/16 1401 10/12/16 0524  BP: (!) 146/80 (!) 148/73  Pulse: 88 66  Resp: 20 18  Temp: 97.8 F (36.6 C) 97.6 F (36.4 C)  SpO2: 96% 100%   Cont to monitor 12.BPH. Proscar -up out of bed to void when possible   13.  Hypokalemia: Resolved  K+ 4.1 on 8/29  Cont to monitor  LOS (Days) 11 A FACE TO FACE EVALUATION WAS PERFORMED  Rylin Saez Karis Jubanil Annjeanette Sarwar 10/12/2016, 8:38 AM

## 2016-10-13 ENCOUNTER — Inpatient Hospital Stay (HOSPITAL_COMMUNITY): Payer: Medicare Other

## 2016-10-13 NOTE — Plan of Care (Signed)
Problem: RH KNOWLEDGE DEFICIT Goal: RH STG INCREASE KNOWLEDGE OF HYPERTENSION Pt and family will demonstrate increased knowledge of hypertension with mod I assistance and resources Outcome: Not Progressing Pt requires continued reinforcement. Family has not been available to assess knowledge.

## 2016-10-13 NOTE — Progress Notes (Signed)
Physical Therapy Session Note  Patient Details  Name: Sarajane JewsCharles Kirkeby MRN: [id number] Date of Birth: Nov 04, 1945  Today's Date: 10/13/2016 PT Individual Time: 1632-1701 PT Individual Time Calculation (min): 29 min   Short Term Goals: Week 2:  PT Short Term Goal 1 (Week 2): STG=LTG due to ELOS  Skilled Therapeutic Interventions/Progress Updates:    Pt seated in w/c upon PT arrival, agreeable to therapy tx and denies pain. Pt doffed gown and donned clean shirt with min assist to loop R UE through, donned pants with min assist to loop LEs through. Pt performed sit<>stand using RW and min assist, in standing pt able to pull up pants over hips with single UE support on RW and min assist to maintain balance. Seated in w/c PT donned shoes with R AFO. Pt ambulated x 60 ft and x 30 ft using RW and min assist, verbal cues for upright posture, increased step length and manual facilitation for lateral weightshifting. Pt left seated in w/c at end of session with needs in reach.   Therapy Documentation Precautions:  Precautions Precautions: Fall Precaution Comments: R hemiparesis Restrictions Weight Bearing Restrictions: No   See Function Navigator for Current Functional Status.   Therapy/Group: Individual Therapy  Cresenciano GenreEmily van Schagen , PT, DPT 10/13/2016, 4:58 PM

## 2016-10-13 NOTE — Progress Notes (Signed)
Subjective/Complaints: Patient seen lying in bed this morning. He slept well overnight. He wants to know if he has therapies today.  ROS: Denies CP, SOB, N/V/D  Objective: Vital Signs: Blood pressure 132/87, pulse 66, temperature (!) 97.4 F (36.3 C), temperature source Oral, resp. rate 18, height 5\' 7"  (1.702 m), weight 89.4 kg (197 lb 3.2 oz), SpO2 96 %. No results found. No results found for this or any previous visit (from the past 72 hour(s)).   Physical exam: Gen: NAD. Vital signs reviewed.  HEENT: normocephalic, atraumatic Cardio: RRR and no JVD Resp: CTA B/L and Unlabored GI: BS positive and ND Musc/Skel:  No edema. No tenderness. Neuro: Alert/Oriented. Motor: 4/5 right delt, bi, tri, grip. mAS 1/4 elbow flexors (stable) RLE: 4/5 HF, KE, 4-/5 ADF/PF  Skin:   Intact. Warm and dry Psych: Normal mood and behavior  Assessment/Plan: 1. Functional deficits secondary to left corona radiata infarct which require 3+ hours per day of interdisciplinary therapy in a comprehensive inpatient rehab setting. Physiatrist is providing close team supervision and 24 hour management of active medical problems listed below. Physiatrist and rehab team continue to assess barriers to discharge/monitor patient progress toward functional and medical goals. FIM: Function - Bathing Position: Wheelchair/chair at sink Body parts bathed by patient: Right arm, Left arm, Chest, Abdomen, Front perineal area, Buttocks, Right upper leg, Left upper leg, Right lower leg, Left lower leg Body parts bathed by helper: Back Assist Level: Touching or steadying assistance(Pt > 75%)  Function- Upper Body Dressing/Undressing What is the patient wearing?: Pull over shirt/dress Pull over shirt/dress - Perfomed by patient: Thread/unthread right sleeve, Thread/unthread left sleeve, Put head through opening, Pull shirt over trunk Pull over shirt/dress - Perfomed by helper: Pull shirt over trunk Assist Level: Set  up Function - Lower Body Dressing/Undressing What is the patient wearing?: Pants, Non-skid slipper socks, Ted Hose Position: Wheelchair/chair at [employer] Pants- Performed by patient: Thread/unthread right pants leg, Pull pants up/down, Thread/unthread left pants leg Pants- Performed by helper: Thread/unthread left pants leg Non-skid slipper socks- Performed by patient: Don/doff right sock, Don/doff left sock Non-skid slipper socks- Performed by helper: Don/doff left sock Shoes - Performed by patient: Don/doff left shoe Shoes - Performed by helper: Don/doff right shoe, Fasten right, Fasten left TED Hose - Performed by helper: Don/doff right TED hose, Don/doff left TED hose Assist for footwear: Dependant Assist for lower body dressing: Touching or steadying assistance (Pt > 75%)  Function - Toileting Toileting activity did not occur: No continent bowel/bladder event Toileting steps completed by patient: Adjust clothing prior to toileting Toileting steps completed by helper: Adjust clothing prior to toileting, Performs perineal hygiene, Adjust clothing after toileting Toileting Assistive Devices: Grab bar or rail Assist level: Two helpers  Function - ArchivistToilet Transfers Toilet transfer assistive device: Elevated toilet seat/BSC over toilet Assist level to toilet: Touching or steadying assistance (Pt > 75%) Assist level from toilet: Touching or steadying assistance (Pt > 75%) Assist level to bedside commode (at bedside): Moderate assist (Pt 50 - 74%/lift or lower) Assist level from bedside commode (at bedside): Moderate assist (Pt 50 - 74%/lift or lower)  Function - Chair/bed transfer Chair/bed transfer method: Stand pivot Chair/bed transfer assist level: Touching or steadying assistance (Pt > 75%) Chair/bed transfer assistive device: Armrests Chair/bed transfer details: Verbal cues for sequencing, Verbal cues for technique, Manual facilitation for weight shifting, Manual facilitation for  placement  Function - Locomotion: Wheelchair Will patient use wheelchair at discharge?: Yes Type: Manual Max wheelchair distance:  40' Assist Level: Maximal assistance (Pt 25 - 49%) Wheel 50 feet with 2 turns activity did not occur: Safety/medical concerns Assist Level: Moderate assistance (Pt 50 - 74%) Wheel 150 feet activity did not occur: N/A Turns around,maneuvers to table,bed, and toilet,negotiates 3% grade,maneuvers on rugs and over doorsills: No Function - Locomotion: Ambulation Assistive device: Walker-rolling Max distance: 52' Assist level: Touching or steadying assistance (Pt > 75%) Assist level: Touching or steadying assistance (Pt > 75%) Walk 50 feet with 2 turns activity did not occur: Safety/medical concerns Assist level: Touching or steadying assistance (Pt > 75%) Walk 150 feet activity did not occur: Safety/medical concerns Walk 10 feet on uneven surfaces activity did not occur: Safety/medical concerns  Function - Comprehension Comprehension: Auditory Comprehension assist level: Follows basic conversation/direction with no assist  Function - Expression Expression: Verbal Expression assist level: Expresses basic needs/ideas: With no assist  Function - Social Interaction Social Interaction assist level: Interacts appropriately with others with medication or extra time (anti-anxiety, antidepressant).  Function - Problem Solving Problem solving assist level: Solves basic 75 - 89% of the time/requires cueing 10 - 24% of the time  Function - Memory Memory assist level: Recognizes or recalls 50 - 74% of the time/requires cueing 25 - 49% of the time Patient normally able to recall (first 3 days only): Current season, Staff names and faces, That he or she is in a hospital   Medical Problem List and Plan: 1. Right side weaknesssecondary to acute infarction left hemisphere/corona radiata. 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 DVT  Prophylaxis/Anticoagulation: Subcutaneous Lovenox. Monitor for any bleeding episodes 3. Pain Management: Tylenol as needed 4. Mood: Provide emotional support 5. Neuropsych: This patient is not fully capable of making decisions on hisown behalf. 6. Skin/Wound Care: Routine skin checks 7. Fluids/Electrolytes/Nutrition: Routine I&Os  Eating 100 % of meals for the most part 8.CKD 3  Cr 1.68 on 8/29  Labs ordered for tomorrow  Encourage fluids 9.Escherichia coli urinary tract infection. Rocephin changed to Keflex completed 8/28 10.Hyperlipidemia. Lipitor 11.Hypertension. Monitor with increased mobility  Restarted norvasc 5mg  on 8/24, increased to 7.5 on 9/1 Vitals:   10/12/16 1446 10/13/16 0520  BP: 131/87 132/87  Pulse: 73 66  Resp: 18 18  Temp: 98.4 F (36.9 C) (!) 97.4 F (36.3 C)  SpO2: 96% 96%   Controlled on 9/2 12.BPH. Proscar -up out of bed to void when possible   13.  Hypokalemia: Resolved  K+ 4.1 on 8/29  Cont to monitor  LOS (Days) 12 A FACE TO FACE EVALUATION WAS PERFORMED  Ankit Karis Juba 10/13/2016, 7:21 AM

## 2016-10-13 NOTE — Plan of Care (Signed)
Problem: RH BLADDER ELIMINATION Goal: RH STG MANAGE BLADDER WITH ASSISTANCE STG Manage Bladder With mod Assistance   Outcome: Not Progressing Pt continues to have incontinent episodes despite offering toileting, urinal on a regular basis.  Goal: RH STG MANAGE BLADDER WITH MEDICATION WITH ASSISTANCE STG Manage Bladder With Medication With mod  Assistance.   Outcome: Not Progressing Pt continues to have incontinent episodes despite offering toileting, urinal on a regular basis.

## 2016-10-14 ENCOUNTER — Inpatient Hospital Stay (HOSPITAL_COMMUNITY): Payer: Medicare Other

## 2016-10-14 ENCOUNTER — Inpatient Hospital Stay (HOSPITAL_COMMUNITY): Payer: Medicare Other | Admitting: Occupational Therapy

## 2016-10-14 ENCOUNTER — Inpatient Hospital Stay (HOSPITAL_COMMUNITY): Payer: Medicare Other | Admitting: Speech Pathology

## 2016-10-14 DIAGNOSIS — R350 Frequency of micturition: Secondary | ICD-10-CM

## 2016-10-14 DIAGNOSIS — R32 Unspecified urinary incontinence: Secondary | ICD-10-CM

## 2016-10-14 LAB — BASIC METABOLIC PANEL
Anion gap: 7 (ref 5–15)
BUN: 19 mg/dL (ref 6–20)
CHLORIDE: 115 mmol/L — AB (ref 101–111)
CO2: 20 mmol/L — AB (ref 22–32)
Calcium: 8.7 mg/dL — ABNORMAL LOW (ref 8.9–10.3)
Creatinine, Ser: 1.65 mg/dL — ABNORMAL HIGH (ref 0.61–1.24)
GFR calc Af Amer: 47 mL/min — ABNORMAL LOW (ref >60)
GFR calc non Af Amer: 40 mL/min — ABNORMAL LOW (ref >60)
GLUCOSE: 171 mg/dL — AB (ref 65–99)
POTASSIUM: 3.9 mmol/L (ref 3.5–5.1)
Sodium: 142 mmol/L (ref 135–145)

## 2016-10-14 MED ORDER — DARIFENACIN HYDROBROMIDE ER 7.5 MG PO TB24
7.5000 mg | ORAL_TABLET | Freq: Every day | ORAL | Status: DC
  Administered 2016-10-14 – 2016-10-16 (×3): 7.5 mg via ORAL
  Filled 2016-10-14 (×3): qty 1

## 2016-10-14 NOTE — Plan of Care (Signed)
Problem: RH BOWEL ELIMINATION Goal: RH STG MANAGE BOWEL WITH ASSISTANCE STG Manage Bowel with mod Assistance.   Outcome: Not Progressing Pt had incontinent episode of bowel this morning and was unaware Goal: RH STG MANAGE BOWEL W/EQUIPMENT W/ASSISTANCE STG Manage Bowel With Equipment With modAssistance   Outcome: Not Progressing Pt requires assistance with peri care after a bowel movement  Problem: RH BLADDER ELIMINATION Goal: RH STG MANAGE BLADDER WITH ASSISTANCE STG Manage Bladder With mod Assistance   Outcome: Not Progressing Pt remains incontinent, started on Enblex this AM to address issue Goal: RH STG MANAGE BLADDER WITH MEDICATION WITH ASSISTANCE STG Manage Bladder With Medication With mod  Assistance.   Outcome: Not Progressing Pt remains incontinent, started on Enblex this AM to address issue.

## 2016-10-14 NOTE — Plan of Care (Signed)
Problem: RH KNOWLEDGE DEFICIT Goal: RH STG INCREASE KNOWLEDGE OF HYPERTENSION Pt and family will demonstrate increased knowledge of hypertension with mod I assistance and resources  Outcome: Progressing Spoke with sons yesterday at end of shift, demonstrate acceptable knowledge of HTN and medication use.

## 2016-10-14 NOTE — Progress Notes (Signed)
Physical Therapy Session Note  Patient Details  Name: Robert Buckley MRN: [id number] Date of Birth: 05-01-1945  Today's Date: 10/14/2016 PT Individual Time: [id number] PT Individual Time Calculation (min): 72 min   Short Term Goals: Week 2:  PT Short Term Goal 1 (Week 2): STG=LTG due to ELOS  Skilled Therapeutic Interventions/Progress Updates:    Pt seated in w/c upon PT arrival, agreeable to therapy tx and denies pain. Pt propelled w/c to the gym x 150 ft with supervision going backwards, using L LE and L UE, verbal cues to avoid objects and increased time to complete. Pt ambulated 2 x 50 ft using RW and min assist, verbal cues for increased step length and RW management. Pt performed squat pivot transfer to the mat from w/c with min assist, verbal cues for set up. Pt performed 2 x 5 sit to stands with min assist and without UE support for emphasis on R LE weightbearing, R NMR and verbal cues for technique. Pt worked on dynamic standing balance without UE support to complete peg board puzzle x 5 minutes, also working on weightshifting over R LE with manual faciliation, equal weightbearing through LEs and verbal cues for upright posture. Pt transferred back to w/c stand pivot with min assist. Pt propelled w/c back to room x 150 ft using L UE and L LE with supervision and verbal cues for backwards propulsion technqiue, verbal cues to avoid obstacles. Pt left seated in w/c with needs in reach.   Therapy Documentation Precautions:  Precautions Precautions: Fall Precaution Comments: R hemiparesis Restrictions Weight Bearing Restrictions: No   See Function Navigator for Current Functional Status.   Therapy/Group: Individual Therapy  Cresenciano GenreEmily van Schagen, PT, DPT 10/14/2016, 12:51 PM

## 2016-10-14 NOTE — NC FL2 (Signed)
[place] MEDICAID FL2 LEVEL OF CARE SCREENING TOOL     IDENTIFICATION  Patient Name: Robert Buckley Birthdate: 1945/05/14 Sex: male Admission Date (Current Location): 10/01/2016  [facility] [place]   Facility and Address:  [facility], [address], [place], [place] [address]      Provider Number: [id number]  Attending Physician Name and Address:  Marcello FennelPatel, Ankit Anil, MD  Relative Name and Phone Number:  Yetta NumbersCraig-son [phone]    Current Level of Care: Other (Comment) (rehab) Recommended Level of Care: Skilled Nursing Facility Prior Approval Number:    Date Approved/Denied:   PASRR Number: [id number]  Discharge Plan: SNF    Current Diagnoses: Patient Active Problem List   Diagnosis Date Noted  . Urinary incontinence   . Urinary frequency   . Benign essential HTN   . Right spastic hemiparesis (HCC)   . Stage 3 chronic kidney disease   . Left sided cerebral hemisphere cerebrovascular accident (CVA) (HCC) 10/01/2016  . Right hemiparesis (HCC) 10/01/2016  . Acute CVA (cerebrovascular accident) (HCC) 09/30/2016  . Dyslipidemia 09/30/2016  . Hypomagnesemia 09/30/2016  . AKI (acute kidney injury) (HCC) 09/30/2016  . Hypokalemia 09/30/2016  . Fall at home, initial encounter 09/30/2016  . Ataxia 09/30/2016  . Essential hypertension   . UTI (urinary tract infection) 09/28/2016    Orientation RESPIRATION BLADDER Height & Weight     Self, Time, Situation, Place  Normal Continent Weight: 197 lb 3.2 oz (89.4 kg) Height:  5\' 7"  (170.2 cm)  BEHAVIORAL SYMPTOMS/MOOD NEUROLOGICAL BOWEL NUTRITION STATUS      Incontinent Diet (heart healthy)  AMBULATORY STATUS COMMUNICATION OF NEEDS Skin   Limited Assist Verbally Normal                       Personal Care Assistance Level of Assistance  Bathing, Dressing Bathing Assistance: Limited assistance   Dressing Assistance: Limited assistance     Functional Limitations  Info  Speech     Speech Info: Impaired    SPECIAL CARE FACTORS FREQUENCY  PT (By licensed PT), OT (By licensed OT), Speech therapy, Bowel and bladder program     PT Frequency: 5x week OT Frequency: 5x week Bowel and Bladder Program Frequency: Timed toileting by staff   Speech Therapy Frequency: 5x week      Contractures Contractures Info: Not present    Additional Factors Info  Code Status, Allergies Code Status Info: Full Code Allergies Info: Sulfa antibiotics           Current Medications (10/14/2016):  This is the current hospital active medication list Current Facility-Administered Medications  Medication Dose Route Frequency Provider Last Rate Last Dose  . amLODipine (NORVASC) tablet 7.5 mg  7.5 mg Oral Daily Marcello FennelPatel, Ankit Anil, MD   7.5 mg at 10/14/16 0830  . atorvastatin (LIPITOR) tablet 40 mg  40 mg Oral q1800 Charlton Amorngiulli, Daniel J, PA-C   40 mg at 10/13/16 1732  . clopidogrel (PLAVIX) tablet 75 mg  75 mg Oral Daily Charlton Amorngiulli, Daniel J, PA-C   75 mg at 10/14/16 [id number]  . darifenacin (ENABLEX) 24 hr tablet 7.5 mg  7.5 mg Oral Daily Marcello FennelPatel, Ankit Anil, MD   7.5 mg at 10/14/16 [id number]  . enoxaparin (LOVENOX) injection 40 mg  40 mg Subcutaneous Q24H AngiulliMcarthur Rossetti, Daniel J, PA-C   40 mg at 10/13/16 1732  . finasteride (PROSCAR) tablet 5 mg  5 mg Oral Daily Angiulli, Mcarthur Rossettianiel J, PA-C   5  mg at 10/14/16 0830  . metoprolol succinate (TOPROL-XL) 24 hr tablet 50 mg  50 mg Oral Daily AngiulliMcarthur Rossetti, PA-C   50 mg at 10/14/16 0830  . ondansetron (ZOFRAN) tablet 4 mg  4 mg Oral Q6H PRN Angiulli, Mcarthur Rossetti, PA-C       Or  . ondansetron [facility]) injection 4 mg  4 mg Intravenous Q6H PRN Angiulli, Mcarthur Rossetti, PA-C      . sorbitol 70 % solution 30 mL  30 mL Oral Daily PRN Charlton Amor, PA-C         Discharge Medications: Please see discharge summary for a list of discharge medications.  Relevant Imaging Results:  Relevant Lab Results:   Additional Information [id number]  Lucy Chris, LCSW

## 2016-10-14 NOTE — Progress Notes (Signed)
Occupational Therapy Session Note  Patient Details  Name: Robert Buckley MRN: [id number] Date of Birth: 02/06/1946  Today's Date: 10/14/2016 OT Individual Time: 1100-1156 OT Individual Time Calculation (min): 56 min    Short Term Goals: Week 2:  OT Short Term Goal 1 (Week 2): Continue working on established supervision level goals.  Skilled Therapeutic Interventions/Progress Updates:   Pt completed shower and dressing during session.  Min assist for functional mobility into the shower with use of the RW.  Pt still needing mod instructional cueing to scoot forward on the left hip and position the RLE underneath him prior to attempting to stand.  Decreased ability to advance the RLE with gait as well, taking short step length with increased knee hyper-extension.  Pt completed all bathing with min assist sit to stand and min instructional cueing to recall the need to stand and wash his bottom.  He was able to transfer out to the wheelchair for dressing tasks.  Min assist for donning brief and pants with max assist to donn TEDs and right shoe with AFO.  He was able to donn the left shoe but could not tie it.  Feel he will likely benefit from shoe buttons for adaptation.  Pt's son present for part of session as well.  Discussed pt's current progress and what we are working on in therapy at this time.  Pt left with son's with call button and phone in reach and safety belt in place.     Therapy Documentation Precautions:  Precautions Precautions: Fall Precaution Comments: R hemiparesis Restrictions Weight Bearing Restrictions: No  Pain: Pain Assessment Pain Assessment: No/denies pain ADL:  See Function Navigator for Current Functional Status.   Therapy/Group: Individual Therapy  Carrye Goller OTR/L 10/14/2016, 12:25 PM

## 2016-10-14 NOTE — Progress Notes (Signed)
Subjective/Complaints: Pt seen laying in bed this AM.  He slept well overnight.  He requests the lights to be turned off in his room.   ROS: Denies CP, SOB, N/V/D  Objective: Vital Signs: Blood pressure 137/88, pulse (!) 59, temperature 98 F (36.7 C), temperature source Oral, resp. rate 16, height 5\' 7"  (1.702 m), weight 89.4 kg (197 lb 3.2 oz), SpO2 96 %. No results found. No results found for this or any previous visit (from the past 72 hour(s)).   Physical exam: Gen: NAD. Vital signs reviewed.  HEENT: normocephalic, atraumatic Cardio: RRR and no JVD Resp: CTA B/L and Unlabored GI: BS positive and ND Musc/Skel:  No edema. No tenderness. Neuro: Alert/Oriented. Motor: 4/5 right delt, bi, tri, grip. mAS 1/4 elbow flexors (unchanged) RLE: 4/5 HF, KE, 4-/5 ADF/PF (stable) Skin:   Intact. Warm and dry Psych: Normal mood and behavior  Assessment/Plan: 1. Functional deficits secondary to left corona radiata infarct which require 3+ hours per day of interdisciplinary therapy in a comprehensive inpatient rehab setting. Physiatrist is providing close team supervision and 24 hour management of active medical problems listed below. Physiatrist and rehab team continue to assess barriers to discharge/monitor patient progress toward functional and medical goals. FIM: Function - Bathing Position: Wheelchair/chair at sink Body parts bathed by patient: Right arm, Left arm, Chest, Abdomen, Front perineal area, Buttocks, Right upper leg, Left upper leg, Right lower leg, Left lower leg Body parts bathed by helper: Back Assist Level: Touching or steadying assistance(Pt > 75%)  Function- Upper Body Dressing/Undressing What is the patient wearing?: Pull over shirt/dress Pull over shirt/dress - Perfomed by patient: Thread/unthread right sleeve, Thread/unthread left sleeve, Put head through opening, Pull shirt over trunk Pull over shirt/dress - Perfomed by helper: Pull shirt over trunk Assist Level:  Set up Function - Lower Body Dressing/Undressing What is the patient wearing?: Pants, Non-skid slipper socks, Ted Hose Position: Wheelchair/chair at [employer]- Performed by patient: Thread/unthread right pants leg, Pull pants up/down, Thread/unthread left pants leg Pants- Performed by helper: Thread/unthread left pants leg Non-skid slipper socks- Performed by patient: Don/doff right sock, Don/doff left sock Non-skid slipper socks- Performed by helper: Don/doff left sock Shoes - Performed by patient: Don/doff left shoe Shoes - Performed by helper: Don/doff right shoe, Fasten right, Fasten left TED Hose - Performed by helper: Don/doff right TED hose, Don/doff left TED hose Assist for footwear: Dependant Assist for lower body dressing: Touching or steadying assistance (Pt > 75%)  Function - Toileting Toileting activity did not occur: No continent bowel/bladder event Toileting steps completed by patient: Adjust clothing prior to toileting Toileting steps completed by helper: Adjust clothing prior to toileting, Performs perineal hygiene, Adjust clothing after toileting Toileting Assistive Devices: Grab bar or rail Assist level: Set up/obtain supplies, Supervision or verbal cues, Touching or steadying assistance (Pt.75%)  Function - Archivist transfer assistive device: Elevated toilet seat/BSC over toilet Assist level to toilet: Touching or steadying assistance (Pt > 75%) Assist level from toilet: Touching or steadying assistance (Pt > 75%) Assist level to bedside commode (at bedside): Moderate assist (Pt 50 - 74%/lift or lower) Assist level from bedside commode (at bedside): Moderate assist (Pt 50 - 74%/lift or lower)  Function - Chair/bed transfer Chair/bed transfer method: Stand pivot Chair/bed transfer assist level: Touching or steadying assistance (Pt > 75%) Chair/bed transfer assistive device: Armrests Chair/bed transfer details: Verbal cues for sequencing, Verbal cues  for technique, Manual facilitation for weight shifting, Manual facilitation for placement  Function -  Locomotion: Wheelchair Will patient use wheelchair at discharge?: Yes Type: Manual Max wheelchair distance: 2740' Assist Level: Maximal assistance (Pt 25 - 49%) Wheel 50 feet with 2 turns activity did not occur: Safety/medical concerns Assist Level: Moderate assistance (Pt 50 - 74%) Wheel 150 feet activity did not occur: N/A Turns around,maneuvers to table,bed, and toilet,negotiates 3% grade,maneuvers on rugs and over doorsills: No Function - Locomotion: Ambulation Assistive device: Walker-rolling Max distance: 60 ft Assist level: Touching or steadying assistance (Pt > 75%) Assist level: Touching or steadying assistance (Pt > 75%) Walk 50 feet with 2 turns activity did not occur: Safety/medical concerns Assist level: Touching or steadying assistance (Pt > 75%) Walk 150 feet activity did not occur: Safety/medical concerns Walk 10 feet on uneven surfaces activity did not occur: Safety/medical concerns  Function - Comprehension Comprehension: Auditory Comprehension assist level: Follows basic conversation/direction with no assist  Function - Expression Expression: Verbal Expression assist level: Expresses basic needs/ideas: With no assist  Function - Social Interaction Social Interaction assist level: Interacts appropriately with others with medication or extra time (anti-anxiety, antidepressant).  Function - Problem Solving Problem solving assist level: Solves basic 75 - 89% of the time/requires cueing 10 - 24% of the time  Function - Memory Memory assist level: Recognizes or recalls 50 - 74% of the time/requires cueing 25 - 49% of the time Patient normally able to recall (first 3 days only): Current season, Staff names and faces, That he or she is in a hospital   Medical Problem List and Plan: 1. Right side weaknesssecondary to acute infarction left hemisphere/corona radiata.  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 DVT Prophylaxis/Anticoagulation: Subcutaneous Lovenox. Monitor for any bleeding episodes 3. Pain Management: Tylenol as needed 4. Mood: Provide emotional support 5. Neuropsych: This patient is not fully capable of making decisions on hisown behalf. 6. Skin/Wound Care: Routine skin checks 7. Fluids/Electrolytes/Nutrition: Routine I&Os  Eating 100 % of meals for the most part 8.CKD 3  Cr 1.68 on 8/29  Labs pending  Encourage fluids 9.Escherichia coli urinary tract infection. Rocephin changed to Keflex completed 8/28 10.Hyperlipidemia. Lipitor 11.Hypertension. Monitor with increased mobility  Restarted norvasc 5mg  on 8/24, increased to 7.5 on 9/1 Vitals:   10/13/16 1422 10/14/16 0558  BP: 127/77 137/88  Pulse: 77 (!) 59  Resp: 17 16  Temp: 97.9 F (36.6 C) 98 F (36.7 C)  SpO2: 95% 96%   Controlled on 9/3 12.BPH. Proscar -up out of bed to void when possible   13.  Hypokalemia: Resolved  K+ 4.1 on 8/29  Cont to monitor 14. Urinary incontinence/freq  Enablex started 9/3  LOS (Days) 13 A FACE TO FACE EVALUATION WAS PERFORMED  Robert Buckley 10/14/2016, 8:36 AM

## 2016-10-14 NOTE — Progress Notes (Signed)
Speech Language Pathology Daily Session Note  Patient Details  Name: Robert JewsCharles Minter MRN: [id number] Date of Birth: [birth year]  Today's Date: 10/14/2016 SLP Individual Time: 0830-0930 SLP Individual Time Calculation (min): 60 min  Short Term Goals: Week 2: SLP Short Term Goal 1 (Week 2): Pt will use external memory aids to facilitate recall of daily information with supervision verbal cues.  SLP Short Term Goal 2 (Week 2): Pt will complete basic, familiar tasks with supervision verbal cues for functional problem solving.  SLP Short Term Goal 3 (Week 2): Pt will sustain his attention to a basic, familiar task for 30 minutes with supervision verbal cues for redirection.   SLP Short Term Goal 4 (Week 2): Pt will return demonstration of at least 3 safety precautions during functional tasks with min assist.    Skilled Therapeutic Interventions: Skilled treatment session focused on cognition goals. SLP facilitated session by providing supervision cues to answer basic questions about safety awareness picture cards. Pt able to sustain attention in moderately distracting noise with supervision cues for ~ 45 minutes. With supervision question cues, pt able to utilize external aids to recall nurse's name and referred to therapy schedule for information regarding therapy times. Pt was left upright in bed, bed alarm on and all needs within reach. Continue per current plan of care.      Function:    Cognition Comprehension Comprehension assist level: Follows basic conversation/direction with no assist;Follows basic conversation/direction with extra time/assistive device  Expression   Expression assist level: Expresses basic needs/ideas: With no assist;Expresses basic needs/ideas: With extra time/assistive device  Social Interaction Social Interaction assist level: Interacts appropriately with others with medication or extra time (anti-anxiety, antidepressant).  Problem Solving Problem solving assist  level: Solves basic 75 - 89% of the time/requires cueing 10 - 24% of the time  Memory Memory assist level: Recognizes or recalls 50 - 74% of the time/requires cueing 25 - 49% of the time    Pain    Therapy/Group: Individual Therapy  Lanita Stammen 10/14/2016, 9:22 AM

## 2016-10-14 NOTE — Plan of Care (Signed)
Problem: RH Dressing Goal: LTG Patient will perform lower body dressing w/assist (OT) LTG: Patient will perform lower body dressing with assist, with/without cues in positioning using equipment (OT)  Goal downgraded based on need for donning AFO

## 2016-10-14 NOTE — Plan of Care (Signed)
Problem: RH Toileting Goal: LTG Patient will perform toileting w/assist, cues/equip (OT) LTG: Patient will perform toiletiing (clothes management/hygiene) with assist, with/without cues using equipment (OT)  Goal  Downgraded based on progress.  Problem: RH Simple Meal Prep Goal: LTG Patient will perform simple meal prep w/assist (OT) LTG: Patient will perform simple meal prep with assistance, with/without cues (OT).  Outcome: Not Applicable Date Met: 08/67/61 Goal discharged as pt is going to SNF.

## 2016-10-15 ENCOUNTER — Inpatient Hospital Stay (HOSPITAL_COMMUNITY): Payer: Medicare Other | Admitting: Occupational Therapy

## 2016-10-15 ENCOUNTER — Inpatient Hospital Stay (HOSPITAL_COMMUNITY): Payer: Medicare Other

## 2016-10-15 ENCOUNTER — Inpatient Hospital Stay (HOSPITAL_COMMUNITY): Payer: Medicare Other | Admitting: Speech Pathology

## 2016-10-15 ENCOUNTER — Inpatient Hospital Stay (HOSPITAL_COMMUNITY): Payer: Medicare Other | Admitting: Physical Therapy

## 2016-10-15 LAB — CREATININE, SERUM
Creatinine, Ser: 1.75 mg/dL — ABNORMAL HIGH (ref 0.61–1.24)
GFR calc non Af Amer: 38 mL/min — ABNORMAL LOW (ref >60)
GFR, EST AFRICAN AMERICAN: 44 mL/min — AB (ref >60)

## 2016-10-15 NOTE — Progress Notes (Signed)
Social Work Patient ID: Robert JewsCharles Buckley, male   DOB: 04/22/1945, 71 y.o.   MRN: [id number]  [facility] a bed for tomorrow. Spoke with son and pt and both agreeable to this plan. Son will be here tomorrow to assist with transfer to [facility]. Will gather paperwork.

## 2016-10-15 NOTE — Discharge Summary (Signed)
Discharge summary job # [id number]

## 2016-10-15 NOTE — Discharge Summary (Signed)
NAME:  Robert Buckley, Robert Buckley NO.:  [id number]  MEDICAL RECORD NO.:  [id number]  LOCATION:                                 FACILITY:  PHYSICIAN:  Maryla Morrow, MD             DATE OF BIRTH:  DATE OF ADMISSION:  10/01/2016 DATE OF DISCHARGE:  10/16/2016                              DISCHARGE SUMMARY   DISCHARGE DIAGNOSES: 1. Acute infarction, left hemisphere corona radiata. 2. Subcutaneous Lovenox for deep vein thrombosis prophylaxis. 3. Chronic kidney disease stage 3. 4. Escherichia coli urinary tract infection. 5. Hyperlipidemia. 6. Hypertension. 7. Benign prostatic hypertrophy. 8. Hypokalemia, resolved.  HISTORY OF PRESENT ILLNESS:  This is a 71 year old right-handed male, history of cerebrovascular accident in 2010, with residual right-sided weakness, maintained on Plavix.  He lives in a handicap accessible apartment.  Has 2 sons in the area.  Presented on September 28, 2016, after a recent fall on September 26, 2016 without loss of consciousness, while trying to get up from his chair with increased right-sided weakness. Cranial CT scan with no acute changes.  Lumbar spine negative.  MRI showed acute 1 cm infarction and left hemisphere radiating white at tracts posterior corona radiata.  No hemorrhage or mass effect.  The patient did not receive tPA.  Carotid Dopplers with no ICA stenosis. Echocardiogram with ejection fraction of 60%, grade 1 diastolic dysfunction.  MRA with no large vessel occlusion, aneurysm, or significant stenosis.  Maintained on Plavix for CVA prophylaxis. Subcutaneous Lovenox for DVT prophylaxis.  Creatinine 1.66 on admission. No prior labs for comparison.  Tolerating a regular diet.  Urine culture, greater than 100,000 Escherichia coli, maintained on Rocephin, changed to Keflex.  The patient was admitted for comprehensive rehab program.  PAST MEDICAL HISTORY:  See discharge diagnoses.  SOCIAL HISTORY:  Lives in a handicap accessible  apartment.  Ambulated with a rolling walker prior to admission.  Functional status upon admission to rehab services was minimal assist, sit to stand; minimal guard, sit to supine; mod max assist, activities of daily living.  PHYSICAL EXAMINATION:  VITAL SIGNS:  Blood pressure 154/91, pulse 68, temperature 97, respirations 18. GENERAL:  This was an alert male, in no acute distress.  Followed commands.  Some perseveration.  Decreased attention. HEENT:  EOMs intact. NECK:  Supple.  Nontender.  No JVD. CARDIAC:  Rate controlled. ABDOMEN:  Soft, nontender.  Good bowel sounds. LUNGS:  Clear to auscultation without wheeze.  REHABILITATION HOSPITAL COURSE:  The patient was admitted to Inpatient Rehab Services.  Therapies initiated on a 3-hour daily basis, consisting of physical therapy, occupational therapy, speech therapy, and rehabilitation nursing.  The following issues were addressed during the patient's rehabilitation stay.  Pertaining to Mr. Nhan' left CVA, remained stable, maintained on Plavix therapy.  He would follow up with Neurology Services.  Subcutaneous Lovenox for deep vein thrombosis prophylaxis.  No bleeding episodes.  Chronic kidney disease stage 3, creatinine 1.65 and monitored.  He completed a course of Keflex for Escherichia coli urinary tract infection.  No dysuria or hematuria. Blood pressures controlled on Norvasc, increased to 7.5 mg daily. Toprol 50 mg daily.  He did have a  history of benign prostatic hypertrophy.  He remained on Proscar.  Voiding without difficulty.  The patient received weekly collaborative interdisciplinary team conferences to discuss estimated length of stay, family teaching, any barriers to discharge.  He was ambulating 55 feet, rolling walker, right AFO, minimal assistance.  Transferred wheelchair to mat, minimal assistance using rolling walker.  Stand to step transfers, gather belongings for activities of daily living, and homemaking,  minimal assist overall.  He was able to communicate his needs.  Tolerating a regular diet.  Working with some external memory aids and card games to address memory compensatory strategies.  Due to limited assistance at home, it was felt skilled nursing facility was needed with bed becoming available on October 16, 2016,  DISCHARGE MEDICATIONS: 1. Norvasc 7.5 mg p.o. daily. 2. Lipitor 40 mg p.o. daily. 3. Plavix 75 mg p.o. daily. 4. Enablex 7.5 mg p.o. daily. 5. Proscar 5 mg p.o. daily. 6. Toprol-XL 50 mg p.o. daily.  DIET:  Regular.  FOLLOWUP:  The patient would follow up with Dr. Maryla MorrowAnkit Patel at the outpatient rehab service office as advised.  [facility], call for appointment in 6 weeks.  Dr. Jacquiline Doealeb Parker, medical management.     Mariam Dollaraniel Angiulli, P.A.   ______________________________ Maryla MorrowAnkit Patel, MD    DA/MEDQ  D:  10/15/2016  T:  10/15/2016  Job:  [id number]  cc:   [facility] Jacquiline Doealeb Parker, MD Maryla MorrowAnkit Patel, MD

## 2016-10-15 NOTE — Progress Notes (Signed)
Speech Language Pathology Daily Session Note  Patient Details  Name: Robert Buckley MRN: [id number] Date of Birth: [birth year]  Today's Date: 10/15/2016 SLP Individual Time: 1324-40100850-0915 SLP Individual Time Calculation (min): 25 min  Short Term Goals: Week 2: SLP Short Term Goal 1 (Week 2): Pt will use external memory aids to facilitate recall of daily information with supervision verbal cues.  SLP Short Term Goal 2 (Week 2): Pt will complete basic, familiar tasks with supervision verbal cues for functional problem solving.  SLP Short Term Goal 3 (Week 2): Pt will sustain his attention to a basic, familiar task for 30 minutes with supervision verbal cues for redirection.   SLP Short Term Goal 4 (Week 2): Pt will return demonstration of at least 3 safety precautions during functional tasks with min assist.    Skilled Therapeutic Interventions:  Pt was seen for skilled ST targeting cognitive goals.  SLP facilitated the session with a novel card game to address memory compensatory strategies: specifically associations.  Pt needed mod-max assist verbal cues to generate word-picture associations but was able to immediately recall 4 out of 4 words with min-mod assist verbal cues.  Pt named specific category members within task for 100% accuracy with min assist verbal cues for recall of previously named items.  Pt was returned to room at the end of today's session with call bell within reach and quick release belt donned.  Continue per current plan of care.       Function:  Eating Eating                 Cognition Comprehension Comprehension assist level: Follows basic conversation/direction with no assist  Expression   Expression assist level: Expresses basic needs/ideas: With no assist  Social Interaction Social Interaction assist level: Interacts appropriately with others with medication or extra time (anti-anxiety, antidepressant).  Problem Solving Problem solving assist level: Solves basic  75 - 89% of the time/requires cueing 10 - 24% of the time  Memory Memory assist level: Recognizes or recalls 50 - 74% of the time/requires cueing 25 - 49% of the time    Pain Pain Assessment Pain Assessment: No/denies pain  Therapy/Group: Individual Therapy  Janazia Schreier, Melanee SpryNicole L 10/15/2016, 9:54 AM

## 2016-10-15 NOTE — Progress Notes (Signed)
Social Work  Discharge Note  The overall goal for the admission was met for:   [facility]  Length of Stay: Yes-15 DAYS  Discharge activity level: Yes-MIN ASSIST LEVEL  Home/community participation: Yes  Services provided included: MD, RD, PT, OT, SLP, RN, CM, TR, Neuropsych and SW  Financial Services: Medicare and Private Insurance: [place]   Follow-up services arranged: Other: SHORT TERM NHP  Comments (or additional information):[facility] NOT ABLE TO PROVIDE 24 HR CARE NEED PT AT A HIGHER LEVEL BEFORE GOING HOME  Patient/Family verbalized understanding of follow-up arrangements: Yes  Individual responsible for coordination of the follow-up plan: SELF & [facility]  Confirmed correct DME delivered: Elease Hashimoto 10/15/2016    Elease Hashimoto

## 2016-10-15 NOTE — Progress Notes (Signed)
Physical Therapy Session Note  Patient Details  Name: Robert Buckley MRN: [id number] Date of Birth: [birth year]  Today's Date: 10/15/2016 PT Individual Time: 1001-1100 PT Individual Time Calculation (min): 59 min   Short Term Goals: Week 2:  PT Short Term Goal 1 (Week 2): STG=LTG due to ELOS  Skilled Therapeutic Interventions/Progress Updates:    Pt seated in w/c upon PT arrival, agreeable to therapy tx and denies pain. Pt ambulated x 55 ft and x 48 ft using RW and R AFO with min assist, verbal cues for R hip and knee extension and increased step length. Pt transferred from w/c to mat with min assist using RW, stand step transfers. Pt performed 1 x 5 sit to stands with L LE on 3 inch block to emphasize R LE weightbearing for R LE NMR. Pt ascended/descended 4 steps using B handrails with min assist and verbal cues for technique. Pt performed sit<>stand transfer and picked item up off floor with R UE, requiring min assist to stabilize. Pt propelled w/c back to room using backwards propulsion technique with supervision and verbal cues to avoid obstacles, x 150 ft. Pt left seated in w/c with needs in reach and QRB in place.   Therapy Documentation Precautions:  Precautions Precautions: Fall Precaution Comments: R hemiparesis Restrictions Weight Bearing Restrictions: No   See Function Navigator for Current Functional Status.   Therapy/Group: Individual Therapy  Cresenciano GenreEmily van Schagen, PT, DPT 10/15/2016, 11:03 AM

## 2016-10-15 NOTE — Plan of Care (Signed)
Problem: RH Balance Goal: LTG Patient will maintain dynamic standing with ADLs (OT) LTG:  Patient will maintain dynamic standing balance with assist during activities of daily living (OT)   Outcome: Not Met (add Reason) Needs min assist  Problem: RH Dressing Goal: LTG Patient will perform lower body dressing w/assist (OT) LTG: Patient will perform lower body dressing with assist, with/without cues in positioning using equipment (OT)  Outcome: Not Met (add Reason) Needs mod assist  Problem: RH Memory Goal: LTG Patient will demonstrate ability for day to day (OT) LTG:  Patient will demonstrate ability for day to day recall/carryover during activities of daily living with assist  (OT)  Outcome: Not Met (add Reason) Needs min to mod assist

## 2016-10-15 NOTE — Progress Notes (Signed)
Occupational Therapy Session Note  Patient Details  Name: Robert Buckley MRN: [id number] Date of Birth: [birth year]  Today's Date: 10/15/2016 OT Individual Time: [id number] OT Individual Time Calculation (min): 57 min    Short Term Goals: Week 2:  OT Short Term Goal 1 (Week 2): Continue working on established supervision level goals.  Skilled Therapeutic Interventions/Progress Updates:    Pt completed toileting and toilet transfer to begin session.  Min assist for transfer from the 3:1 over the toilet to the wheelchair at the sink.  He completed sponge bath after toileting secondary to request, and not wanting to take shower since he took one yesterday.  He completed bathing sit to stand with overall min assist as well as for dressing, except for donning TEDs and shoes.  Total assist for donning TEDs with min assist to donn the left shoe and fasten and max assist for the right shoe with AFO.  Therapist provided shoe buttons for adaptation to tying this session.  Pt needed max demonstrational cueing to fasten.  Finished session with pt in wheelchair with call button and phone in reach.  Safety belt also in place.    Therapy Documentation Precautions:  Precautions Precautions: Fall Precaution Comments: R hemiparesis Restrictions Weight Bearing Restrictions: No  Pain: Pain Assessment Pain Assessment: No/denies pain ADL:  See Function Navigator for Current Functional Status.   Therapy/Group: Individual Therapy  [name] OTR/L 10/15/2016, 8:57 AM

## 2016-10-15 NOTE — Progress Notes (Signed)
Subjective/Complaints: Pt seen laying in bed this AM.  He slept well overnight.  He states the bladder meds have helped significantly. Spoke with sons yesterday regarding improvement and meds.  He has questions about his discharge date/location.   ROS: Denies CP, SOB, N/V/D  Objective: Vital Signs: Blood pressure 137/82, pulse 67, temperature 98.1 F (36.7 C), temperature source Oral, resp. rate 17, height '5\' 7"'  (1.702 m), weight 89.4 kg (197 lb 3.2 oz), SpO2 97 %. No results found. Results for orders placed or performed during the hospital encounter of 10/01/16 (from the past 72 hour(s))  Basic metabolic panel     Status: Abnormal   Collection Time: 10/14/16 10:59 AM  Result Value Ref Range   Sodium 142 135 - 145 mmol/L   Potassium 3.9 3.5 - 5.1 mmol/L   Chloride 115 (H) 101 - 111 mmol/L   CO2 20 (L) 22 - 32 mmol/L   Glucose, Bld 171 (H) 65 - 99 mg/dL   BUN 19 6 - 20 mg/dL   Creatinine, Ser 1.65 (H) 0.61 - 1.24 mg/dL   Calcium 8.7 (L) 8.9 - 10.3 mg/dL   GFR calc non Af Amer 40 (L) >60 mL/min   GFR calc Af Amer 47 (L) >60 mL/min    Comment: (NOTE) The eGFR has been calculated using the CKD EPI equation. This calculation has not been validated in all clinical situations. eGFR's persistently <60 mL/min signify possible Chronic Kidney Disease.    Anion gap 7 5 - 15  Creatinine, serum     Status: Abnormal   Collection Time: 10/15/16  6:24 AM  Result Value Ref Range   Creatinine, Ser 1.75 (H) 0.61 - 1.24 mg/dL   GFR calc non Af Amer 38 (L) >60 mL/min   GFR calc Af Amer 44 (L) >60 mL/min    Comment: (NOTE) The eGFR has been calculated using the CKD EPI equation. This calculation has not been validated in all clinical situations. eGFR's persistently <60 mL/min signify possible Chronic Kidney Disease.      Physical exam: Gen: NAD. Vital signs reviewed.  HEENT: normocephalic, atraumatic Cardio: RRR and no JVD Resp: CTA B/L and Unlabored GI: BS positive and ND Musc/Skel:  No  edema. No tenderness. Neuro: Alert/Oriented. Motor: 4/5 right delt, bi, tri, grip. mAS 1/4 elbow flexors (stable) RLE: 4/5 HF, KE, 4-/5 ADF/PF (stable) Skin:   Intact. Warm and dry Psych: Normal mood and behavior  Assessment/Plan: 1. Functional deficits secondary to left corona radiata infarct which require 3+ hours per day of interdisciplinary therapy in a comprehensive inpatient rehab setting. Physiatrist is providing close team supervision and 24 hour management of active medical problems listed below. Physiatrist and rehab team continue to assess barriers to discharge/monitor patient progress toward functional and medical goals. FIM: Function - Bathing Position: Wheelchair/chair at sink Body parts bathed by patient: Right arm, Left arm, Chest, Abdomen, Front perineal area, Buttocks, Right upper leg, Left upper leg, Right lower leg, Left lower leg Body parts bathed by helper: Back Assist Level: Touching or steadying assistance(Pt > 75%)  Function- Upper Body Dressing/Undressing What is the patient wearing?: Pull over shirt/dress Pull over shirt/dress - Perfomed by patient: Thread/unthread right sleeve, Thread/unthread left sleeve, Put head through opening, Pull shirt over trunk Pull over shirt/dress - Perfomed by helper: Pull shirt over trunk Assist Level: Set up Set up : To obtain clothing/put away Function - Lower Body Dressing/Undressing What is the patient wearing?: Pants, Non-skid slipper socks, Shoes, Ted Hose Position: Wheelchair/chair at sink  Pants- Performed by patient: Thread/unthread right pants leg, Thread/unthread left pants leg, Pull pants up/down Pants- Performed by helper: Thread/unthread left pants leg Non-skid slipper socks- Performed by patient: Don/doff right sock, Don/doff left sock Non-skid slipper socks- Performed by helper: Don/doff right sock, Don/doff left sock Shoes - Performed by patient: Don/doff left shoe Shoes - Performed by helper: Fasten left,  Fasten right TED Hose - Performed by helper: Don/doff right TED hose, Don/doff left TED hose Assist for footwear: Dependant Assist for lower body dressing: Touching or steadying assistance (Pt > 75%)  Function - Toileting Toileting activity did not occur: No continent bowel/bladder event Toileting steps completed by patient: Adjust clothing prior to toileting, Adjust clothing after toileting Toileting steps completed by helper: Performs perineal hygiene, Adjust clothing after toileting Toileting Assistive Devices: Grab bar or rail Assist level: Set up/obtain supplies, Supervision or verbal cues, Touching or steadying assistance (Pt.75%)  Function - Toilet Transfers Toilet transfer assistive device: Elevated toilet seat/BSC over toilet Assist level to toilet: Touching or steadying assistance (Pt > 75%) Assist level from toilet: Touching or steadying assistance (Pt > 75%) Assist level to bedside commode (at bedside): Moderate assist (Pt 50 - 74%/lift or lower) Assist level from bedside commode (at bedside): Moderate assist (Pt 50 - 74%/lift or lower)  Function - Chair/bed transfer Chair/bed transfer method: Stand pivot Chair/bed transfer assist level: Touching or steadying assistance (Pt > 75%) Chair/bed transfer assistive device: Armrests Chair/bed transfer details: Verbal cues for sequencing, Verbal cues for technique, Manual facilitation for weight shifting, Manual facilitation for placement  Function - Locomotion: Wheelchair Will patient use wheelchair at discharge?: Yes Type: Manual Max wheelchair distance: 150 ft Assist Level: Supervision or verbal cues (propelling backwards with L UE and L LE) Wheel 50 feet with 2 turns activity did not occur: Safety/medical concerns Assist Level: Supervision or verbal cues Wheel 150 feet activity did not occur: N/A Assist Level: Supervision or verbal cues Turns around,maneuvers to table,bed, and toilet,negotiates 3% grade,maneuvers on rugs and  over doorsills: No Function - Locomotion: Ambulation Assistive device: Walker-rolling Max distance: 50 ft Assist level: Touching or steadying assistance (Pt > 75%) Assist level: Touching or steadying assistance (Pt > 75%) Walk 50 feet with 2 turns activity did not occur: Safety/medical concerns Assist level: Touching or steadying assistance (Pt > 75%) Walk 150 feet activity did not occur: Safety/medical concerns Walk 10 feet on uneven surfaces activity did not occur: Safety/medical concerns  Function - Comprehension Comprehension: Auditory Comprehension assist level: Follows basic conversation/direction with no assist  Function - Expression Expression: Verbal Expression assist level: Expresses basic needs/ideas: With no assist  Function - Social Interaction Social Interaction assist level: Interacts appropriately with others with medication or extra time (anti-anxiety, antidepressant).  Function - Problem Solving Problem solving assist level: Solves basic 75 - 89% of the time/requires cueing 10 - 24% of the time  Function - Memory Memory assist level: Recognizes or recalls 50 - 74% of the time/requires cueing 25 - 49% of the time Patient normally able to recall (first 3 days only): Current season, Staff names and faces, That he or she is in a hospital   Medical Problem List and Plan: 1. Right side weaknesssecondary to acute infarction left hemisphere/corona radiata. 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 DVT Prophylaxis/Anticoagulation: Subcutaneous Lovenox. Monitor for any bleeding episodes 3. Pain Management: Tylenol as needed 4. Mood: Provide emotional support 5. Neuropsych: This patient is not fully capable of  making decisions on hisown behalf. 6. Skin/Wound Care: Routine skin checks 7. Fluids/Electrolytes/Nutrition: Routine I&Os  Eating 100 % of meals for the most part 8.CKD 3  Cr 1.65 on 9/3  Encourage  fluids 9.Escherichia coli urinary tract infection. Rocephin changed to Keflex completed 8/28 10.Hyperlipidemia. Lipitor 11.Hypertension. Monitor with increased mobility  Restarted norvasc 83m on 8/24, increased to 7.5 on 9/1 Vitals:   10/14/16 1442 10/15/16 0554  BP: 126/71 137/82  Pulse: 77 67  Resp: 17 17  Temp: 98.3 F (36.8 C) 98.1 F (36.7 C)  SpO2: 96% 97%   Controlled on 9/4 12.BPH. Proscar -up out of bed to void when possible   13.  Hypokalemia: Resolved  K+ 3.9 on 9/3  Cont to monitor 14. Urinary incontinence/freq, improving  Enablex started 9/3  LOS (Days) 14 A FACE TO FACE EVALUATION WAS PERFORMED  Christophor Eick ALorie Phenix9/05/2016, 8:18 AM

## 2016-10-15 NOTE — Progress Notes (Signed)
Physical Therapy Discharge Summary  Patient Details  Name: Robert Buckley MRN: [id number] Date of Birth: [birth year]  Today's Date: 10/15/2016 PT Individual Time: 1415-1500 PT Individual Time Calculation (min): 45 min   Pt in w/c upon arrival and agreeable to therapy, no c/o pain. Worked on functional mobility and independence this session. Practiced bed mobility and car transfers as detailed below w/ verbal and visual cues for technique. Ambulated 23' and 63' w/ Min guard using RW and verbal and visual cues for gait pattern. Ended session in w/c w/ pink safety belt on, call bell within reach and all needs met.   Patient has met 3 of 8 long term goals due to improved activity tolerance, improved balance, improved postural control, increased strength, ability to compensate for deficits, functional use of  left upper extremity and left lower extremity, improved attention, improved awareness and improved coordination.  Patient to discharge at a wheelchair level Supervision.   Patient's care partner unavailable to provide the necessary physical and cognitive assistance at discharge as pt is discharging to a skilled nursing facility.   Reasons goals not met: Pt progressing towards 5 of 8 LTGs. Ambulation, transfers, and dynamic standing balance remain at [place] secondary to decreased functional use of RUE/LEs at this time in addition to decreased motor planning. Bed mobility remains at supervision level secondary to decreased consistency of assistance required and cues required to achieve task.   Recommendation:  Patient will benefit from ongoing skilled PT services in skilled nursing facility setting to continue to advance safe functional mobility, address ongoing impairments in R hemiplegia, functional mobility, dynamic sitting and standing balance, LE strengthening, endurance, and minimize fall risk.  Equipment: w/c, RW  Reasons for discharge: treatment goals met and discharge from  hospital  Patient/family agrees with progress made and goals achieved: Yes  PT Discharge Precautions/Restrictions Precautions Precautions: Fall Precaution Comments: R hemiparesis Restrictions Weight Bearing Restrictions: No Vital Signs Therapy Vitals Temp: 98.4 F (36.9 C) Temp Source: Oral Pulse Rate: 80 Resp: 18 BP: 136/85 Patient Position (if appropriate): Sitting Oxygen Therapy SpO2: 96 % O2 Device: Not Delivered Pain Pain Assessment Pain Assessment: No/denies pain Vision/Perception  Vision - Assessment Eye Alignment: Within Functional Limits Ocular Range of Motion: Within Functional Limits Alignment/Gaze Preference: Within Defined Limits Tracking/Visual Pursuits: Able to track stimulus in all quads without difficulty Convergence: Within functional limits Perception Perception: Impaired (Decreased awareness of R side) Praxis Praxis: Intact  Cognition Overall Cognitive Status: Impaired/Different from baseline Arousal/Alertness: Awake/alert Orientation Level: Oriented X4 Attention: Sustained;Selective Sustained Attention: Appears intact Sustained Attention Impairment: Verbal basic Selective Attention: Impaired Memory: Impaired Memory Impairment: Decreased recall of new information;Storage deficit Awareness: Impaired Awareness Impairment: Anticipatory impairment Problem Solving: Impaired Problem Solving Impairment: Functional complex;Functional basic Executive Function: Reasoning;Decision Making Reasoning: Impaired Reasoning Impairment: Functional basic Decision Making: Impaired Decision Making Impairment: Functional basic Behaviors: Perseveration Safety/Judgment: Impaired Comments: Pt still with decreased carryover from session to session with regards to technique for scooting and sit to stand transitions.  Continues to demonstrate decreased awareness of environment, especially R side.  Sensation Sensation Light Touch: Appears Intact Stereognosis: Not  tested Hot/Cold: Appears Intact Proprioception: Appears Intact Proprioception Impaired Details: Impaired RLE Additional Comments: Decreased proprioception RLE and feelings of "heaviness"  Coordination Gross Motor Movements are Fluid and Coordinated: No Fine Motor Movements are Fluid and Coordinated: No Coordination and Movement Description: Pt uses the RUE as an active assist with min facilitation for bathing and dressing tasks.  History of weakness secondary to previous CVA  but pt states its a little worse since this CVA. Heel Shin Test: difficulty bilaterally Motor  Motor Motor: Hemiplegia Motor - Discharge Observations: R hemiplegia, mild deconditioning and strength overall   Mobility Bed Mobility Bed Mobility: Rolling Right;Rolling Left;Supine to Sit;Sit to Supine Rolling Right: 5: Supervision Rolling Left: 5: Supervision Supine to Sit: 5: Supervision Sit to Supine: 5: Supervision Transfers Transfers: Yes Sit to Stand: 4: Min assist Sit to Stand Details: Manual facilitation for weight shifting;Manual facilitation for weight bearing;Manual facilitation for placement Stand to Sit: 5: Supervision Stand Pivot Transfers: 4: Min assist Stand Pivot Transfer Details: Manual facilitation for placement;Manual facilitation for weight bearing;Verbal cues for safe use of DME/AE;Verbal cues for technique;Verbal cues for sequencing Stand Pivot Transfer Details (indicate cue type and reason): w/ RW Locomotion  Ambulation Ambulation: Yes Ambulation/Gait Assistance: 4: Min guard Ambulation Distance (Feet): 60 Feet Assistive device: Rolling walker Gait Gait: Yes Gait Pattern: Impaired Gait Pattern: Decreased step length - right;Decreased hip/knee flexion - right;Decreased stride length;Decreased stance time - right;Shuffle;Scissoring;Trunk flexed;Narrow base of support;Poor foot clearance - right Gait velocity: decreased Stairs / Additional Locomotion Stairs: Yes Stair Management  Technique: Two rails Number of Stairs: 4 Curb: 4: Min Chemical engineer: Yes Wheelchair Assistance: 5: Investment banker, operational Details: Verbal cues for technique Wheelchair Propulsion: Left lower extremity;Left upper extremity Wheelchair Parts Management: Supervision/cueing Distance: 150'  Trunk/Postural Assessment  Cervical Assessment Cervical Assessment: Within Functional Limits Thoracic Assessment Thoracic Assessment: Exceptions to [facility] (Increased kyphyosis, rounded shoulders) Lumbar Assessment Lumbar Assessment: Exceptions to [facility] (posterior pelvic tilt) Postural Control Postural Control: Within Functional Limits  Balance Balance Balance Assessed: Yes Static Sitting Balance Static Sitting - Balance Support: No upper extremity supported;Feet supported Static Sitting - Level of Assistance: 5: Stand by assistance Dynamic Sitting Balance Dynamic Sitting - Balance Support: During functional activity Dynamic Sitting - Level of Assistance: 5: Stand by assistance Static Standing Balance Static Standing - Balance Support: During functional activity Static Standing - Level of Assistance: 5: Stand by assistance Dynamic Standing Balance Dynamic Standing - Balance Support: During functional activity;Bilateral upper extremity supported Dynamic Standing - Level of Assistance: 4: Min assist Extremity Assessment  RUE Assessment RUE Assessment: Not tested (Defer to OT) RUE Strength RUE Overall Strength Comments: Pt with prior hemiparesis but reports worsening this admission.  Brunnstrum stage V in the arm with increased tone noted in the elbow flexors, and digit flexors.  AROM shoulder flexion 0- 70 degrees in synergy pattern with compensations of shoulder hike present.  Increased pain reported at the elbow as welll with shoulder flexion.  Demonstrates AROM elbow flexion 0-140 degrees with slower speed as well as extension 0-160 degrees.  Full gross grasp  and released but unable to demonstrate tip to tip pinch for FM tasks.   LUE Assessment LUE Assessment: Not tested (Defer to OT) RLE Assessment RLE Assessment: Exceptions to [facility] (2 to 3-/5 globally) LLE Assessment LLE Assessment: Within Functional Limits   See Function Navigator for Current Functional Status.  Helmut Hennon K Arnette 10/15/2016, 3:03 PM

## 2016-10-15 NOTE — Progress Notes (Signed)
Occupational Therapy Discharge Summary  Patient Details  Name: Robert Buckley MRN: [id number] Date of Birth: [birth year]   Patient has met 9 of 12 long term goals due to improved balance, postural control, functional use of  RIGHT upper extremity, improved attention and improved awareness.  Patient to discharge at [facility] Assist level.  Patient's care partner unavailable to provide the necessary physical and cognitive assistance at discharge.    Reasons goals not met: Pt still needs cueing for carryover from session to session secondary to decreased memory.  He continues to need min assist for dynamic standing balance as it relates to selfcare tasks.  He continues to need mod assist for LB dressing.    Recommendation:  Patient will benefit from ongoing skilled OT services in skilled nursing facility setting to continue to advance functional skills in the area of BADL.  Pt still needs min assist for basic selfcare tasks at this time and for functional transfers.  Recommend continued OT at SNF level as pt will not have the 24 hr supervision he needs at home at this time.  Equipment: No equipment provided  Reasons for discharge: treatment goals met and discharge from hospital  Patient/family agrees with progress made and goals achieved: Yes  OT Discharge Precautions/Restrictions  Precautions Precautions: Fall Precaution Comments: R hemiparesis Restrictions Weight Bearing Restrictions: No  Pain Pain Assessment Pain Assessment: No/denies pain ADL  See Function Section of chart Vision Baseline Vision/History: Wears glasses Wears Glasses: Reading only Patient Visual Report: No change from baseline Vision Assessment?: Yes Eye Alignment: Within Functional Limits Ocular Range of Motion: Within Functional Limits Alignment/Gaze Preference: Within Defined Limits Tracking/Visual Pursuits: Able to track stimulus in all quads without difficulty Convergence: Within functional  limits Visual Fields: No apparent deficits  Cognition Overall Cognitive Status: Impaired/Different from baseline Arousal/Alertness: Awake/alert Orientation Level: Oriented X4 Attention: Sustained;Selective Sustained Attention: Appears intact Selective Attention: Impaired Memory: Impaired Memory Impairment: Decreased recall of new information;Storage deficit Awareness: Impaired Awareness Impairment: Anticipatory impairment Problem Solving Impairment: Functional complex;Functional basic Safety/Judgment: Impaired Comments: Pt sitll with decreased carryover from session to session with regards to technique for scooting and sit to stand transitions.   Sensation Sensation Stereognosis: Not tested Hot/Cold: Appears Intact Proprioception: Appears Intact Proprioception Impaired Details: Impaired RUE Additional Comments: Pt with impaired proprioception in the right hand and wrist but intact at elbow and shoulder.   Coordination Gross Motor Movements are Fluid and Coordinated: No Fine Motor Movements are Fluid and Coordinated: No Coordination and Movement Description: Pt uses the RUE as an active assist with min facilitation for bathing and dressing tasks.  History of weakness secondary to previous CVA but pt states its a little worse since this CVA. Motor  Motor Motor: Hemiplegia Motor - Discharge Observations: RUE and RLE moderate hemiparesis Mobility  Transfers Transfers: Sit to Stand;Stand to Sit Sit to Stand: 4: Min assist;From chair/3-in-1;With upper extremity assist Stand to Sit: 4: Min assist;With upper extremity assist;To chair/3-in-1  Trunk/Postural Assessment  Cervical Assessment Cervical Assessment: Exceptions to [facility] (cervical protraction) Thoracic Assessment Thoracic Assessment: Exceptions to [facility] (thoracic kyphosis) Lumbar Assessment Lumbar Assessment: Exceptions to [facility] (psoterior pelvic tilt at rest)  Balance Balance Balance Assessed: Yes Static Sitting  Balance Static Sitting - Balance Support: No upper extremity supported;Feet supported Static Sitting - Level of Assistance: 5: Stand by assistance Dynamic Sitting Balance Dynamic Sitting - Balance Support: During functional activity Dynamic Sitting - Level of Assistance: 5: Stand by assistance Static Standing Balance Static Standing - Balance Support: During  functional activity Static Standing - Level of Assistance: 4: Min assist Dynamic Standing Balance Dynamic Standing - Balance Support: During functional activity Dynamic Standing - Level of Assistance: 4: Min assist Extremity/Trunk Assessment RUE Assessment RUE Assessment: Within Functional Limits RUE Strength RUE Overall Strength Comments: Pt with prior hemiparesis but reports worsening this admission.  Brunnstrum stage V in the arm with increased tone noted in the elbow flexors, and digit flexors.  AROM shoulder flexion 0- 70 degrees in synergy pattern with compensations of shoulder hike present.  Increased pain reported at the elbow as welll with shoulder flexion.  Demonstrates AROM elbow flexion 0-140 degrees with slower speed as well as extension 0-160 degrees.  Full gross grasp and released but unable to demonstrate tip to tip pinch for FM tasks.   LUE Assessment LUE Assessment: Within Functional Limits   See Function Navigator for Current Functional Status.  Malon Siddall OTR/L 10/15/2016, 2:02 PM

## 2016-10-16 ENCOUNTER — Inpatient Hospital Stay (HOSPITAL_COMMUNITY): Payer: Medicare Other | Admitting: Speech Pathology

## 2016-10-16 ENCOUNTER — Inpatient Hospital Stay (HOSPITAL_COMMUNITY): Payer: Medicare Other | Admitting: Occupational Therapy

## 2016-10-16 ENCOUNTER — Inpatient Hospital Stay (HOSPITAL_COMMUNITY): Payer: Medicare Other

## 2016-10-16 MED ORDER — CLOPIDOGREL BISULFATE 75 MG PO TABS: 75.0000 mg | ORAL_TABLET | Freq: Every day | ORAL | Status: AC

## 2016-10-16 MED ORDER — METOPROLOL SUCCINATE ER 50 MG PO TB24: 50.0000 mg | ORAL_TABLET | Freq: Every day | ORAL | Status: AC

## 2016-10-16 MED ORDER — AMLODIPINE BESYLATE 2.5 MG PO TABS: 7.5000 mg | ORAL_TABLET | Freq: Every day | ORAL | Status: DC

## 2016-10-16 MED ORDER — ATORVASTATIN CALCIUM 40 MG PO TABS: 40.0000 mg | ORAL_TABLET | Freq: Every day | ORAL | 0 refills | Status: DC

## 2016-10-16 MED ORDER — DARIFENACIN HYDROBROMIDE ER 7.5 MG PO TB24: 7.5000 mg | ORAL_TABLET | Freq: Every day | ORAL | Status: DC

## 2016-10-16 MED ORDER — FINASTERIDE 5 MG PO TABS: 5.0000 mg | ORAL_TABLET | Freq: Every day | ORAL | Status: AC

## 2016-10-16 NOTE — Plan of Care (Signed)
Problem: RH Problem Solving Goal: LTG Patient will demonstrate problem solving for (SLP) LTG:  Patient will demonstrate problem solving for basic/complex daily situations with cues  (SLP)  Outcome: Not Met (add Reason) Min assist needed for problem solving   Problem: RH Awareness Goal: LTG: Patient will demonstrate intellectual/emergent (SLP) LTG: Patient will demonstrate intellectual/emergent/anticipatory awareness with assist during a cognitive/linguistic activity  (SLP)  Outcome: Not Met (add Reason) Min assist for emergent awareness    

## 2016-10-16 NOTE — Progress Notes (Signed)
Speech Language Pathology Discharge Summary  Patient Details  Name: Robert Buckley MRN: [id number] Date of Birth: Mar 06, 1945   Patient has met 2 of 4 long term goals.  Patient to discharge at overall Supervision;Min level.  Reasons goals not met: pt needs min assist for problem solving and emergent awareness    Clinical Impression/Discharge Summary:  Pt has made slow functional gains while inpatient and is discharging having met 2 out of 4 long term goals.  Pt is currently min assist-supervision for basic cognitive tasks due to mild cognitive deficits characterized by decreased storage and retrieval of information, decreased functional problem solving,and decreased emergent awareness of deficits.  Pt has demonstrated improved safety awareness but still needs cues for safety with mobility due to impulsivity.  Pt and family education has been limited at this venue due to lack of family attendance during therapy sessions.  Recommend ongoing education at [facility] upon discharge.    Care Partner:  Caregiver Able to Provide Assistance:  (SNF)  Type of Caregiver Assistance:  (SNF)  Recommendation:  Skilled Nursing facility  Rationale for SLP Follow Up: Reduce caregiver burden;Maximize cognitive function and independence   Equipment: none recommended by SLP    Reasons for discharge: Discharged from hospital   Patient/Family Agrees with Progress Made and Goals Achieved: Yes    [name], Selinda Orion 10/16/2016, 8:12 AM

## 2016-10-16 NOTE — H&P (Signed)
Pt discharged to [facility] via [facility] transportation. Report called to oncoming nurse at [facility] and patient discharged with all belongings.

## 2016-10-16 NOTE — Progress Notes (Signed)
Subjective/Complaints: Pt seen laying in bed this AM.  He slept well overnight and is excited about being discharged.   ROS: Denies CP, SOB, N/V/D  Objective: Vital Signs: Blood pressure (!) 146/72, pulse 74, temperature 98.2 F (36.8 C), temperature source Oral, resp. rate 18, height '5\' 7"'  (1.702 m), weight 89.4 kg (197 lb 3.2 oz), SpO2 98 %. No results found. Results for orders placed or performed during the hospital encounter of 10/01/16 (from the past 72 hour(s))  Basic metabolic panel     Status: Abnormal   Collection Time: 10/14/16 10:59 AM  Result Value Ref Range   Sodium 142 135 - 145 mmol/L   Potassium 3.9 3.5 - 5.1 mmol/L   Chloride 115 (H) 101 - 111 mmol/L   CO2 20 (L) 22 - 32 mmol/L   Glucose, Bld 171 (H) 65 - 99 mg/dL   BUN 19 6 - 20 mg/dL   Creatinine, Ser 1.65 (H) 0.61 - 1.24 mg/dL   Calcium 8.7 (L) 8.9 - 10.3 mg/dL   GFR calc non Af Amer 40 (L) >60 mL/min   GFR calc Af Amer 47 (L) >60 mL/min    Comment: (NOTE) The eGFR has been calculated using the CKD EPI equation. This calculation has not been validated in all clinical situations. eGFR's persistently <60 mL/min signify possible Chronic Kidney Disease.    Anion gap 7 5 - 15  Creatinine, serum     Status: Abnormal   Collection Time: 10/15/16  6:24 AM  Result Value Ref Range   Creatinine, Ser 1.75 (H) 0.61 - 1.24 mg/dL   GFR calc non Af Amer 38 (L) >60 mL/min   GFR calc Af Amer 44 (L) >60 mL/min    Comment: (NOTE) The eGFR has been calculated using the CKD EPI equation. This calculation has not been validated in all clinical situations. eGFR's persistently <60 mL/min signify possible Chronic Kidney Disease.      Physical exam: Gen: NAD. Vital signs reviewed.  HEENT: normocephalic, atraumatic Cardio: RRR and no JVD Resp: CTA B/L and Unlabored GI: BS positive and ND Musc/Skel:  No edema. No tenderness. Neuro: Alert/Oriented. Motor: 4/5 right delt, bi, tri, grip. mAS 1/4 elbow flexors (unchanged) RLE:  4/5 HF, KE, 4-/5 ADF/PF (stable) Skin:   Intact. Warm and dry Psych: Normal mood and behavior  Assessment/Plan: 1. Functional deficits secondary to left corona radiata infarct which require 3+ hours per day of interdisciplinary therapy in a comprehensive inpatient rehab setting. Physiatrist is providing close team supervision and 24 hour management of active medical problems listed below. Physiatrist and rehab team continue to assess barriers to discharge/monitor patient progress toward functional and medical goals. FIM: Function - Bathing Position: Wheelchair/chair at sink Body parts bathed by patient: Right arm, Left arm, Chest, Abdomen, Front perineal area, Buttocks, Right upper leg, Left upper leg, Right lower leg, Left lower leg Body parts bathed by helper: Back Assist Level: Touching or steadying assistance(Pt > 75%)  Function- Upper Body Dressing/Undressing What is the patient wearing?: Pull over shirt/dress Pull over shirt/dress - Perfomed by patient: Thread/unthread right sleeve, Thread/unthread left sleeve, Put head through opening, Pull shirt over trunk Pull over shirt/dress - Perfomed by helper: Pull shirt over trunk Assist Level: Supervision or verbal cues Set up : To obtain clothing/put away Function - Lower Body Dressing/Undressing What is the patient wearing?: Pants, Non-skid slipper socks, Shoes, Ted Hose, AFO Position: Wheelchair/chair at [employer]- Performed by patient: Thread/unthread right pants leg, Thread/unthread left pants leg, Pull pants up/down  Pants- Performed by helper: Thread/unthread left pants leg Non-skid slipper socks- Performed by patient: Don/doff right sock, Don/doff left sock Non-skid slipper socks- Performed by helper: Don/doff right sock, Don/doff left sock Shoes - Performed by patient: Don/doff left shoe, Fasten left Shoes - Performed by helper: Fasten right, Don/doff right shoe AFO - Performed by helper: Don/doff right AFO TED Hose - Performed  by helper: Don/doff right TED hose, Don/doff left TED hose Assist for footwear: Dependant Assist for lower body dressing: Touching or steadying assistance (Pt > 75%)  Function - Toileting Toileting activity did not occur: No continent bowel/bladder event Toileting steps completed by patient: Adjust clothing prior to toileting, Adjust clothing after toileting, Performs perineal hygiene Toileting steps completed by helper: Performs perineal hygiene, Adjust clothing after toileting Toileting Assistive Devices: Grab bar or rail Assist level: Touching or steadying assistance (Pt.75%)  Function - Toilet Transfers Toilet transfer assistive device: Elevated toilet seat/BSC over toilet Assist level to toilet: Touching or steadying assistance (Pt > 75%) Assist level from toilet: Touching or steadying assistance (Pt > 75%) Assist level to bedside commode (at bedside): Moderate assist (Pt 50 - 74%/lift or lower) Assist level from bedside commode (at bedside): Moderate assist (Pt 50 - 74%/lift or lower)  Function - Chair/bed transfer Chair/bed transfer method: Stand pivot Chair/bed transfer assist level: Touching or steadying assistance (Pt > 75%) Chair/bed transfer assistive device: Armrests, Walker Chair/bed transfer details: Verbal cues for sequencing, Verbal cues for technique, Manual facilitation for weight shifting, Manual facilitation for placement  Function - Locomotion: Wheelchair Will patient use wheelchair at discharge?: Yes Type: Manual Max wheelchair distance: 150 ft Assist Level: Supervision or verbal cues (backwards propulsion) Wheel 50 feet with 2 turns activity did not occur: Safety/medical concerns Assist Level: Supervision or verbal cues Wheel 150 feet activity did not occur: N/A Assist Level: Supervision or verbal cues Turns around,maneuvers to table,bed, and toilet,negotiates 3% grade,maneuvers on rugs and over doorsills: No Function - Locomotion: Ambulation Assistive  device: Walker-rolling Max distance: 92' Assist level: Touching or steadying assistance (Pt > 75%) Assist level: Touching or steadying assistance (Pt > 75%) Walk 50 feet with 2 turns activity did not occur: Safety/medical concerns Assist level: Touching or steadying assistance (Pt > 75%) Walk 150 feet activity did not occur: Refused Walk 10 feet on uneven surfaces activity did not occur: Safety/medical concerns Assist level: Moderate assist (Pt 50 - 74%)  Function - Comprehension Comprehension: Auditory Comprehension assist level: Follows basic conversation/direction with extra time/assistive device  Function - Expression Expression: Verbal Expression assist level: Expresses basic needs/ideas: With extra time/assistive device  Function - Social Interaction Social Interaction assist level: Interacts appropriately 90% of the time - Needs monitoring or encouragement for participation or interaction.  Function - Problem Solving Problem solving assist level: Solves basic 90% of the time/requires cueing < 10% of the time  Function - Memory Memory assist level: Recognizes or recalls 50 - 74% of the time/requires cueing 25 - 49% of the time Patient normally able to recall (first 3 days only): Current season, Staff names and faces, That he or she is in a hospital   Medical Problem List and Plan: 1. Right side weaknesssecondary to acute infarction left hemisphere/corona radiata. 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 DVT Prophylaxis/Anticoagulation: Subcutaneous Lovenox. Monitor for any bleeding episodes 3. Pain Management: Tylenol as needed 4. Mood: Provide emotional support 5. Neuropsych: This patient is  not fully capable of making decisions on hisown behalf. 6. Skin/Wound Care: Routine skin checks 7. Fluids/Electrolytes/Nutrition: Routine I&Os  Eating 100 % of meals for the most part 8.CKD 3  Cr 1.75  on 9/4  Encourage fluids 9.Escherichia coli urinary tract infection. Rocephin changed to Keflex completed 8/28 10.Hyperlipidemia. Lipitor 11.Hypertension. Monitor with increased mobility  Restarted norvasc 66m on 8/24, increased to 7.5 on 9/1 Vitals:   10/15/16 1412 10/16/16 0450  BP: 136/85 (!) 146/72  Pulse: 80 74  Resp: 18 18  Temp: 98.4 F (36.9 C) 98.2 F (36.8 C)  SpO2: 96% 98%   Overall controlled on 9/5 12.BPH. Proscar -up out of bed to void when possible   13.  Hypokalemia: Resolved  K+ 3.9 on 9/3  Cont to monitor 14. Urinary incontinence/freq, improving  Enablex started 9/3  LOS (Days) 15 A FACE TO FACE EVALUATION WAS PERFORMED  Evea Sheek ALorie Phenix9/06/2016, 7:48 AM

## 2016-10-17 ENCOUNTER — Telehealth: Payer: Self-pay

## 2016-10-17 NOTE — Telephone Encounter (Signed)
ERROR

## 2016-10-22 ENCOUNTER — Ambulatory Visit: Payer: Medicare Other | Admitting: Family Medicine

## 2016-10-24 ENCOUNTER — Encounter: Payer: Medicare Other | Admitting: Physical Medicine & Rehabilitation

## 2016-11-18 ENCOUNTER — Ambulatory Visit: Payer: Medicare Other | Admitting: Neurology

## 2016-11-19 ENCOUNTER — Encounter: Payer: Self-pay | Admitting: Neurology

## 2016-11-21 ENCOUNTER — Telehealth: Payer: Self-pay

## 2016-11-21 NOTE — Telephone Encounter (Signed)
Patient no show for appt on 11/18/2016. 

## 2019-08-24 ENCOUNTER — Emergency Department: Payer: Medicare HMO

## 2019-08-24 ENCOUNTER — Other Ambulatory Visit: Payer: Self-pay

## 2019-08-24 ENCOUNTER — Emergency Department
Admission: EM | Admit: 2019-08-24 | Discharge: 2019-08-24 | Disposition: A | Discharge status: Home / Self Care | Payer: Medicare HMO | Attending: Emergency Medicine | Admitting: Emergency Medicine

## 2019-08-24 DIAGNOSIS — M7122 Synovial cyst of popliteal space [Baker], left knee: Secondary | ICD-10-CM

## 2019-08-24 DIAGNOSIS — N183 Chronic kidney disease, stage 3 unspecified: Secondary | ICD-10-CM | POA: Diagnosis not present

## 2019-08-24 DIAGNOSIS — I129 Hypertensive chronic kidney disease with stage 1 through stage 4 chronic kidney disease, or unspecified chronic kidney disease: Secondary | ICD-10-CM | POA: Diagnosis not present

## 2019-08-24 DIAGNOSIS — Z79899 Other long term (current) drug therapy: Secondary | ICD-10-CM | POA: Diagnosis not present

## 2019-08-24 DIAGNOSIS — M79605 Pain in left leg: Secondary | ICD-10-CM

## 2019-08-24 DIAGNOSIS — M1712 Unilateral primary osteoarthritis, left knee: Secondary | ICD-10-CM | POA: Insufficient documentation

## 2019-08-24 DIAGNOSIS — R52 Pain, unspecified: Secondary | ICD-10-CM

## 2019-08-24 LAB — CBC WITH DIFFERENTIAL/PLATELET
Abs Immature Granulocytes: 0.04 10*3/uL (ref 0.00–0.07)
Basophils Absolute: 0 10*3/uL (ref 0.0–0.1)
Basophils Relative: 1 %
Eosinophils Absolute: 0.1 10*3/uL (ref 0.0–0.5)
Eosinophils Relative: 2 %
HCT: 47.8 % (ref 39.0–52.0)
Hemoglobin: 17.7 g/dL — ABNORMAL HIGH (ref 13.0–17.0)
Immature Granulocytes: 1 %
Lymphocytes Relative: 18 %
Lymphs Abs: 1.1 10*3/uL (ref 0.7–4.0)
MCH: 31.8 pg (ref 26.0–34.0)
MCHC: 37 g/dL — ABNORMAL HIGH (ref 30.0–36.0)
MCV: 85.8 fL (ref 80.0–100.0)
Monocytes Absolute: 0.4 10*3/uL (ref 0.1–1.0)
Monocytes Relative: 6 %
Neutro Abs: 4.5 10*3/uL (ref 1.7–7.7)
Neutrophils Relative %: 72 %
Platelets: 175 10*3/uL (ref 150–400)
RBC: 5.57 MIL/uL (ref 4.22–5.81)
RDW: 13.1 % (ref 11.5–15.5)
WBC: 6.2 10*3/uL (ref 4.0–10.5)
nRBC: 0 % (ref 0.0–0.2)

## 2019-08-24 LAB — COMPREHENSIVE METABOLIC PANEL
ALT: 36 U/L (ref 0–44)
AST: 30 U/L (ref 15–41)
Albumin: 4.1 g/dL (ref 3.5–5.0)
Alkaline Phosphatase: 105 U/L (ref 38–126)
Anion gap: 9 (ref 5–15)
BUN: 16 mg/dL (ref 8–23)
CO2: 22 mmol/L (ref 22–32)
Calcium: 8.7 mg/dL — ABNORMAL LOW (ref 8.9–10.3)
Chloride: 111 mmol/L (ref 98–111)
Creatinine, Ser: 1.61 mg/dL — ABNORMAL HIGH (ref 0.61–1.24)
GFR calc Af Amer: 48 mL/min — ABNORMAL LOW (ref >60)
GFR calc non Af Amer: 42 mL/min — ABNORMAL LOW (ref >60)
Glucose, Bld: 89 mg/dL (ref 70–99)
Potassium: 4.2 mmol/L (ref 3.5–5.1)
Sodium: 142 mmol/L (ref 135–145)
Total Bilirubin: 1.6 mg/dL — ABNORMAL HIGH (ref 0.3–1.2)
Total Protein: 7.7 g/dL (ref 6.5–8.1)

## 2019-08-24 LAB — MAGNESIUM: Magnesium: 1.9 mg/dL (ref 1.7–2.4)

## 2019-08-24 MED ORDER — TRAMADOL HCL 50 MG PO TABS
50.0000 mg | ORAL_TABLET | Freq: Once | ORAL | Status: AC
  Administered 2019-08-24: 50 mg via ORAL
  Filled 2019-08-24: qty 1

## 2019-08-24 MED ORDER — TRAMADOL HCL 50 MG PO TABS: 50.0000 mg | ORAL_TABLET | Freq: Three times a day (TID) | ORAL | 0 refills | Status: DC | PRN

## 2019-08-24 NOTE — Discharge Instructions (Addendum)
Follow-up with Dr. Martha Clan who is the orthopedist on call if the Baker's cyst continues to give you problems.  A prescription for tramadol was sent to your pharmacy.  This medication could cause drowsiness.  Take every 8 hours as needed for pain.  You may also use ice to your leg as needed for discomfort.

## 2019-08-24 NOTE — ED Notes (Signed)
Pt to CT

## 2019-08-24 NOTE — ED Triage Notes (Addendum)
Pt comes via [facility] from home with c/o left leg pain. Pt states he slept on it the wrong way last night. Pt denies any recent falls or injuries.  Pt states pain is worse when he tries to stand on it.  Pt denies any other complaints.

## 2019-08-24 NOTE — ED Triage Notes (Signed)
First nurse note- here for left leg cramp via EMS.  VSS with EMS

## 2019-08-24 NOTE — ED Provider Notes (Signed)
  [facility] EMERGENCY DEPARTMENT Provider Note   CSN: [id number] Arrival date & time: 08/24/19  1522     History Chief Complaint  Patient presents with  . Leg Pain    Robert Buckley is a 74 y.o. male who presents to the emergency department for evaluation of left leg pain.  Upon awakening today he stood up and felt his left knee give way.  I assumed care from Bridget Hartshorn, patient pending x-rays of the left hip, pelvis per son's request range of motion.  X-ray of the hip showed mild left hip osteoarthritis.  Patient is noted to be point tender along the medial joint line of the left knee so knee x-rays were ordered and showed moderate medial compartment osteoarthritis.  Patient's knee exam benign, no effusion, warmth or redness.  Tender along the medial joint line with no laxity with valgus or varus stress testing.  He had no pain with hip internal or external rotation.  Patient agreeable to knee hinged brace, this was placed today in the ER.  He will wear this at all times with ambulation.  He will call orthopedic office to schedule follow-up appointment.  Patient stable and ready for discharge to home.    Khizar, Fiorella 08/24/19 2135    Chesley Noon, MD 08/24/19 2205

## 2019-08-24 NOTE — ED Provider Notes (Addendum)
[facility] Emergency Department Provider Note   ____________________________________________   First MD Initiated Contact with Patient 08/24/19 1636     (approximate)  I have reviewed the triage vital signs and the nursing notes.   HISTORY  Chief Complaint Leg Pain   HPI Robert Buckley is a 74 y.o. male presents to the ED via EMS with complaint with complaint of left leg pain.  Patient states that it hurts from his hip all the way down to his leg.  He denies any recent falls or injuries.  He states pain is worse when he weightbears.  Pain began today when he woke up.  Patient reports that was approximately 30 minutes prior to arrival to the ED.  He reports it feels like a cramping sensation.  Patient with right hemiparesis secondary to his CVA.      Past Medical History:  Diagnosis Date   Arthritis    Hypertension    Stroke [facility]) 2008   residual R sided weakness    Patient Active Problem List   Diagnosis Date Noted   Urinary incontinence    Urinary frequency    Benign essential HTN    Right spastic hemiparesis (HCC)    Stage 3 chronic kidney disease    Left sided cerebral hemisphere cerebrovascular accident (CVA) (HCC) 10/01/2016   Right hemiparesis (HCC) 10/01/2016   Acute CVA (cerebrovascular accident) (HCC) 09/30/2016   Dyslipidemia 09/30/2016   Hypomagnesemia 09/30/2016   AKI (acute kidney injury) (HCC) 09/30/2016   Hypokalemia 09/30/2016   Fall at home, initial encounter 09/30/2016   Ataxia 09/30/2016   Essential hypertension    UTI (urinary tract infection) 09/28/2016    History reviewed. No pertinent surgical history.  Prior to Admission medications   Medication Sig Start Date End Date Taking? Authorizing Provider  amLODipine (NORVASC) 2.5 MG tablet Take 3 tablets (7.5 mg total) by mouth daily. 10/16/16   Angiulli, Mcarthur Rossetti, PA-C  atorvastatin (LIPITOR) 40 MG tablet Take 1 tablet (40 mg total) by mouth daily at  6 PM. 10/16/16   Angiulli, Mcarthur Rossetti, PA-C  clopidogrel (PLAVIX) 75 MG tablet Take 1 tablet (75 mg total) by mouth daily. 10/16/16   Angiulli, Mcarthur Rossetti, PA-C  darifenacin (ENABLEX) 7.5 MG 24 hr tablet Take 1 tablet (7.5 mg total) by mouth daily. 10/16/16   Angiulli, Mcarthur Rossetti, PA-C  finasteride (PROSCAR) 5 MG tablet Take 1 tablet (5 mg total) by mouth daily. 10/16/16   Angiulli, Mcarthur Rossetti, PA-C  metoprolol succinate (TOPROL-XL) 50 MG 24 hr tablet Take 1 tablet (50 mg total) by mouth daily. Take with or immediately following a meal. 10/16/16   Angiulli, Mcarthur Rossetti, PA-C  traMADol (ULTRAM) 50 MG tablet Take 1 tablet (50 mg total) by mouth every 8 (eight) hours as needed. 08/24/19   Tommi Rumps, PA-C    Allergies Sulfa antibiotics  Family History  Problem Relation Age of Onset   Diabetes Mother     Social History Social History   Tobacco Use   Smoking status: Never Smoker   Smokeless tobacco: Never Used  Building services engineer Use: Never used  Substance Use Topics   Alcohol use: No   Drug use: No    Review of Systems Constitutional: No fever/chills Eyes: No visual changes. Cardiovascular: Denies chest pain. Respiratory: Denies shortness of breath. Gastrointestinal: No abdominal pain.  No nausea, no vomiting.   Genitourinary: Negative for dysuria. Musculoskeletal: Positive for left leg pain. Skin: Negative for rash. Neurological:  Negative for headaches.  Right sided hemiparesis secondary to CVA.  ____________________________________________   PHYSICAL EXAM:  VITAL SIGNS: ED Triage Vitals  Enc Vitals Group     BP 08/24/19 1543 (!) 143/94     Pulse Rate 08/24/19 1543 (!) 59     Resp 08/24/19 1543 17     Temp 08/24/19 1543 98 F (36.7 C)     Temp src --      SpO2 08/24/19 1543 92 %     Weight 08/24/19 1544 180 lb (81.6 kg)     Height 08/24/19 1544 5\' 7"  (1.702 m)     Head Circumference --      Peak Flow --      Pain Score 08/24/19 1544 8     Pain Loc --      Pain Edu?  --      Excl. in GC? --     Constitutional: Alert and oriented. Well appearing and in no acute distress.  Active, consolable by mother, nontoxic appearance. Eyes: Conjunctivae are normal.  Head: Atraumatic. Nose: No congestion/rhinnorhea.  Neck: No stridor.   Cardiovascular: Normal rate, regular rhythm. Grossly normal heart sounds.  Good peripheral circulation. Respiratory: Normal respiratory effort.  No retractions. Lungs CTAB. Gastrointestinal: Soft and nontender. No distention. Musculoskeletal: On examination of the left lower leg there is no gross deformity or soft tissue edema.  There is no discoloration of the skin or abrasions to suggest injury.  Pulses are present bilaterally.  No shortening of the left lower extremity or rotation was noted.  Patient is able to put weight on his foot and push up to help remove his pants.  No warmth or erythema is present.  08/26/19' sign was negative. Neurologic:  Normal speech and language. No gross focal neurologic deficits are appreciated.  Skin:  Skin is warm, dry and intact. No rash noted. Psychiatric: Mood and affect are normal. Speech and behavior are normal.  ____________________________________________   LABS (all labs ordered are listed, but only abnormal results are displayed)  Labs Reviewed  CBC WITH DIFFERENTIAL/PLATELET - Abnormal; Notable for the following components:      Result Value   Hemoglobin 17.7 (*)    MCHC 37.0 (*)    All other components within normal limits  COMPREHENSIVE METABOLIC PANEL - Abnormal; Notable for the following components:   Creatinine, Ser 1.61 (*)    Calcium 8.7 (*)    Total Bilirubin 1.6 (*)    GFR calc non Af Amer 42 (*)    GFR calc Af Amer 48 (*)    All other components within normal limits  MAGNESIUM    RADIOLOGY   Official radiology report(s): No results found.  ____________________________________________   PROCEDURES  Procedure(s) performed (including Critical  Care):  Procedures   ____________________________________________   INITIAL IMPRESSION / ASSESSMENT AND PLAN / ED COURSE  As part of my medical decision making, I reviewed the following data within the electronic MEDICAL RECORD NUMBER Notes from prior ED visits and [place] Controlled Substance Database  74 year old male is brought to the ED via EMS from home after 30 minutes of leg pain when he woke.  Patient denies any injuries.  He states that pain is worse when he stands.  He describes it is a cramping sensation.  Lab work was essentially benign.  Patient has had creatinines that were elevated at [facility] which was noted to be 1.85 1-year ago.  Today was 1.61.  An ultrasound was done to rule out a  DVT.  A Baker's cyst was noted in the popliteal space.  Patient was made aware as well as the son who is present.  A prescription for tramadol was sent to his pharmacy to take every 8 hours if needed for pain.  He is aware that this medication may cause drowsiness and increase his risk for injury.  He is to follow-up with Dr. Martha Clan who is the orthopedist on call if any continued problems.  ----------------------------------------- 7:49 PM on 08/24/2019 ----------------------------------------- At the time of discharge family requested a CT scan of his lower extremity.  With no history of acute injury and with patient being able to put weight on his left leg while lifting his bottom up to help with his pants being removed we will start with an x-ray of his left hip and pelvis to evaluate that area.  This patient was handed off to [facility] at 8 PM was in the x-ray department to continue with his evaluation of his left hip and leg pain. ____________________________________________   FINAL CLINICAL IMPRESSION(S) / ED DIAGNOSES  Final diagnoses:  Baker's cyst, left  Left leg pain  Pain  Primary osteoarthritis of left knee     ED Discharge Orders         Ordered    traMADol (ULTRAM) 50 MG  tablet  Every 8 hours PRN     Discontinue  Reprint     08/24/19 1838           Note:  This document was prepared using Dragon voice recognition software and may include unintentional dictation errors.    Tommi Rumps, PA-C 08/24/19 1913    Tommi Rumps, PA-C 08/26/19 1317    Phineas Semen, MD 08/26/19 [phone]

## 2019-10-24 ENCOUNTER — Inpatient Hospital Stay
Admission: EM | Admit: 2019-10-24 | Discharge: 2019-10-27 | DRG: 177 | Disposition: A | Discharge status: Home Health | Payer: Medicare HMO | Attending: Internal Medicine | Admitting: Internal Medicine

## 2019-10-24 ENCOUNTER — Emergency Department: Payer: Medicare HMO

## 2019-10-24 DIAGNOSIS — G9341 Metabolic encephalopathy: Secondary | ICD-10-CM | POA: Diagnosis present

## 2019-10-24 DIAGNOSIS — Z882 Allergy status to sulfonamides status: Secondary | ICD-10-CM

## 2019-10-24 DIAGNOSIS — I69351 Hemiplegia and hemiparesis following cerebral infarction affecting right dominant side: Secondary | ICD-10-CM

## 2019-10-24 DIAGNOSIS — J1282 Pneumonia due to coronavirus disease 2019: Secondary | ICD-10-CM | POA: Diagnosis present

## 2019-10-24 DIAGNOSIS — J9601 Acute respiratory failure with hypoxia: Secondary | ICD-10-CM | POA: Diagnosis present

## 2019-10-24 DIAGNOSIS — R531 Weakness: Secondary | ICD-10-CM

## 2019-10-24 DIAGNOSIS — E785 Hyperlipidemia, unspecified: Secondary | ICD-10-CM | POA: Diagnosis present

## 2019-10-24 DIAGNOSIS — N1831 Chronic kidney disease, stage 3a: Secondary | ICD-10-CM | POA: Diagnosis present

## 2019-10-24 DIAGNOSIS — Z87891 Personal history of nicotine dependence: Secondary | ICD-10-CM

## 2019-10-24 DIAGNOSIS — Z833 Family history of diabetes mellitus: Secondary | ICD-10-CM

## 2019-10-24 DIAGNOSIS — N3281 Overactive bladder: Secondary | ICD-10-CM | POA: Diagnosis present

## 2019-10-24 DIAGNOSIS — Z7902 Long term (current) use of antithrombotics/antiplatelets: Secondary | ICD-10-CM

## 2019-10-24 DIAGNOSIS — I129 Hypertensive chronic kidney disease with stage 1 through stage 4 chronic kidney disease, or unspecified chronic kidney disease: Secondary | ICD-10-CM | POA: Diagnosis present

## 2019-10-24 DIAGNOSIS — Z79899 Other long term (current) drug therapy: Secondary | ICD-10-CM

## 2019-10-24 DIAGNOSIS — E876 Hypokalemia: Secondary | ICD-10-CM | POA: Diagnosis present

## 2019-10-24 DIAGNOSIS — U071 COVID-19: Secondary | ICD-10-CM | POA: Diagnosis not present

## 2019-10-24 DIAGNOSIS — N400 Enlarged prostate without lower urinary tract symptoms: Secondary | ICD-10-CM | POA: Diagnosis present

## 2019-10-24 LAB — COMPREHENSIVE METABOLIC PANEL
ALT: 28 U/L (ref 0–44)
AST: 28 U/L (ref 15–41)
Albumin: 3.7 g/dL (ref 3.5–5.0)
Alkaline Phosphatase: 97 U/L (ref 38–126)
Anion gap: 12 (ref 5–15)
BUN: 14 mg/dL (ref 8–23)
CO2: 19 mmol/L — ABNORMAL LOW (ref 22–32)
Calcium: 8.1 mg/dL — ABNORMAL LOW (ref 8.9–10.3)
Chloride: 115 mmol/L — ABNORMAL HIGH (ref 98–111)
Creatinine, Ser: 1.58 mg/dL — ABNORMAL HIGH (ref 0.61–1.24)
GFR calc Af Amer: 50 mL/min — ABNORMAL LOW (ref >60)
GFR calc non Af Amer: 43 mL/min — ABNORMAL LOW (ref >60)
Glucose, Bld: 101 mg/dL — ABNORMAL HIGH (ref 70–99)
Potassium: 3.4 mmol/L — ABNORMAL LOW (ref 3.5–5.1)
Sodium: 146 mmol/L — ABNORMAL HIGH (ref 135–145)
Total Bilirubin: 1 mg/dL (ref 0.3–1.2)
Total Protein: 7.1 g/dL (ref 6.5–8.1)

## 2019-10-24 LAB — DIFFERENTIAL
Abs Immature Granulocytes: 0.03 10*3/uL (ref 0.00–0.07)
Basophils Absolute: 0 10*3/uL (ref 0.0–0.1)
Basophils Relative: 0 %
Eosinophils Absolute: 0 10*3/uL (ref 0.0–0.5)
Eosinophils Relative: 1 %
Immature Granulocytes: 1 %
Lymphocytes Relative: 8 %
Lymphs Abs: 0.5 10*3/uL — ABNORMAL LOW (ref 0.7–4.0)
Monocytes Absolute: 0.7 10*3/uL (ref 0.1–1.0)
Monocytes Relative: 12 %
Neutro Abs: 4.6 10*3/uL (ref 1.7–7.7)
Neutrophils Relative %: 78 %

## 2019-10-24 LAB — PROTIME-INR
INR: 1 (ref 0.8–1.2)
Prothrombin Time: 12.5 seconds (ref 11.4–15.2)

## 2019-10-24 LAB — CBC
HCT: 43.8 % (ref 39.0–52.0)
Hemoglobin: 15.5 g/dL (ref 13.0–17.0)
MCH: 31.6 pg (ref 26.0–34.0)
MCHC: 35.4 g/dL (ref 30.0–36.0)
MCV: 89.4 fL (ref 80.0–100.0)
Platelets: 152 10*3/uL (ref 150–400)
RBC: 4.9 MIL/uL (ref 4.22–5.81)
RDW: 13.9 % (ref 11.5–15.5)
WBC: 5.9 10*3/uL (ref 4.0–10.5)
nRBC: 0 % (ref 0.0–0.2)

## 2019-10-24 LAB — GLUCOSE, CAPILLARY: Glucose-Capillary: 114 mg/dL — ABNORMAL HIGH (ref 70–99)

## 2019-10-24 LAB — LACTIC ACID, PLASMA: Lactic Acid, Venous: 2.8 mmol/L (ref 0.5–1.9)

## 2019-10-24 LAB — APTT: aPTT: 28 seconds (ref 24–36)

## 2019-10-24 MED ORDER — ACETAMINOPHEN 500 MG PO TABS
1000.0000 mg | ORAL_TABLET | Freq: Once | ORAL | Status: AC
  Administered 2019-10-24: 1000 mg via ORAL
  Filled 2019-10-24: qty 2

## 2019-10-24 MED ORDER — LACTATED RINGERS IV BOLUS
1000.0000 mL | Freq: Once | INTRAVENOUS | Status: AC
  Administered 2019-10-24: 1000 mL via INTRAVENOUS

## 2019-10-24 NOTE — Progress Notes (Signed)
Ch arrived at room in response to Code STROKE. Pt was sitting in bed, Ch checked with Rn and then checked in with Pt. Pt said he was okay, but was very hungry and needs a sandwich. Pt's son around to support Pt. RN will page Ch if need arises.

## 2019-10-24 NOTE — ED Notes (Signed)
Pt lactic is 2.8 per lab, Dr. York Cerise made aware via secure chat.

## 2019-10-24 NOTE — ED Triage Notes (Signed)
LNK 2000 per family, weakness noted by family to L side, pt states weakness to R side, pervious stroke with R side residual weakness. Per family pt slept all day and after a shower pt was stumbling to walk into kitchen. Pt alert and oriented only to self and state bu tnot able to answer where he lives or what year it is. Denies pain

## 2019-10-24 NOTE — ED Notes (Signed)
Pt escorted to CT on monitor by RN, tolerated well. Pt son intermittently at bedside.

## 2019-10-24 NOTE — ED Provider Notes (Signed)
[facility] Emergency Department Provider Note   ____________________________________________    I have reviewed the triage vital signs and the nursing notes.   HISTORY  Chief Complaint Weakness     HPI Prem Coykendall is a 74 y.o. male with a history of CVA in the past which has left him with weakness especially in the right leg, he uses a walker to get around, he does have a history of kidney disease as well.  He presents after an episode of difficulty ambulating and possible confusion.  Son reports that around 8:30 PM patient was walking to his bed to get his clothes on and became very weak and dragged his right leg more than usual.  He had difficulty standing up and seemed to slur his words.  Patient does report he has had a dry cough, denies fevers or chills.  Has not been vaccinated against COVID-19.  Past Medical History:  Diagnosis Date   Arthritis    Hypertension    Stroke [facility]) 2008   residual R sided weakness    Patient Active Problem List   Diagnosis Date Noted   Urinary incontinence    Urinary frequency    Benign essential HTN    Right spastic hemiparesis (HCC)    Stage 3 chronic kidney disease    Left sided cerebral hemisphere cerebrovascular accident (CVA) (HCC) 10/01/2016   Right hemiparesis (HCC) 10/01/2016   Acute CVA (cerebrovascular accident) (HCC) 09/30/2016   Dyslipidemia 09/30/2016   Hypomagnesemia 09/30/2016   AKI (acute kidney injury) (HCC) 09/30/2016   Hypokalemia 09/30/2016   Fall at home, initial encounter 09/30/2016   Ataxia 09/30/2016   Essential hypertension    UTI (urinary tract infection) 09/28/2016    No past surgical history on file.  Prior to Admission medications   Medication Sig Start Date End Date Taking? Authorizing Provider  amLODipine (NORVASC) 2.5 MG tablet Take 3 tablets (7.5 mg total) by mouth daily. 10/16/16   Angiulli, Mcarthur Rossetti, PA-C  atorvastatin (LIPITOR) 40 MG tablet  Take 1 tablet (40 mg total) by mouth daily at 6 PM. 10/16/16   Angiulli, Mcarthur Rossetti, PA-C  clopidogrel (PLAVIX) 75 MG tablet Take 1 tablet (75 mg total) by mouth daily. 10/16/16   Angiulli, Mcarthur Rossetti, PA-C  darifenacin (ENABLEX) 7.5 MG 24 hr tablet Take 1 tablet (7.5 mg total) by mouth daily. 10/16/16   Angiulli, Mcarthur Rossetti, PA-C  finasteride (PROSCAR) 5 MG tablet Take 1 tablet (5 mg total) by mouth daily. 10/16/16   Angiulli, Mcarthur Rossetti, PA-C  metoprolol succinate (TOPROL-XL) 50 MG 24 hr tablet Take 1 tablet (50 mg total) by mouth daily. Take with or immediately following a meal. 10/16/16   Angiulli, Mcarthur Rossetti, PA-C  traMADol (ULTRAM) 50 MG tablet Take 1 tablet (50 mg total) by mouth every 8 (eight) hours as needed. 08/24/19   Tommi Rumps, PA-C     Allergies Sulfa antibiotics  Family History  Problem Relation Age of Onset   Diabetes Mother     Social History Social History   Tobacco Use   Smoking status: Never Smoker   Smokeless tobacco: Never Used  Building services engineer Use: Never used  Substance Use Topics   Alcohol use: No   Drug use: No    Review of Systems  Constitutional: No fever/chills Eyes: No visual changes.  ENT: No sore throat. Cardiovascular: Denies chest pain. Respiratory: Denies shortness of breath.  As above Gastrointestinal: No abdominal pain.   Genitourinary:  Negative for dysuria. Musculoskeletal: Negative for back pain. Skin: Negative for rash. Neurological: As above   ____________________________________________   PHYSICAL EXAM:  VITAL SIGNS: ED Triage Vitals [10/24/19 2130]  Enc Vitals Group     BP (!) 146/103     Pulse Rate 83     Resp 18     Temp (!) 102 F (38.9 C)     Temp Source Oral     SpO2 91 %     Weight      Height      Head Circumference      Peak Flow      Pain Score 0     Pain Loc      Pain Edu?      Excl. in GC?     Constitutional: Alert and oriented.  Eyes: Conjunctivae are normal.  PERRLA, EOMI Head: Atraumatic. Nose:  No congestion/rhinnorhea. Mouth/Throat: Mucous membranes are moist.   Neck:  Painless ROM Cardiovascular: Normal rate, regular rhythm. Grossly normal heart sounds.  Good peripheral circulation. Respiratory: Normal respiratory effort.  No retractions. Lungs CTAB. Gastrointestinal: Soft and nontender. No distention.  No CVA tenderness. Genitourinary: deferred Musculoskeletal:  Warm and well perfused Neurologic:  Normal speech and language.  Cranial nerves II to XII are normal.  Chronic weakness in the right leg, unchanged per patient Skin:  Skin is warm, dry and intact. No rash noted. Psychiatric: Mood and affect are normal. Speech and behavior are normal.  ____________________________________________   LABS (all labs ordered are listed, but only abnormal results are displayed)  Labs Reviewed  GLUCOSE, CAPILLARY - Abnormal; Notable for the following components:      Result Value   Glucose-Capillary 114 (*)    All other components within normal limits  DIFFERENTIAL - Abnormal; Notable for the following components:   Lymphs Abs 0.5 (*)    All other components within normal limits  COMPREHENSIVE METABOLIC PANEL - Abnormal; Notable for the following components:   Sodium 146 (*)    Potassium 3.4 (*)    Chloride 115 (*)    CO2 19 (*)    Glucose, Bld 101 (*)    Creatinine, Ser 1.58 (*)    Calcium 8.1 (*)    GFR calc non Af Amer 43 (*)    GFR calc Af Amer 50 (*)    All other components within normal limits  URINE CULTURE  CULTURE, BLOOD (ROUTINE X 2)  CULTURE, BLOOD (ROUTINE X 2)  PROTIME-INR  APTT  CBC  URINE DRUG SCREEN, QUALITATIVE ([facility] ONLY)  URINALYSIS, ROUTINE W REFLEX MICROSCOPIC  ETHANOL  LACTIC ACID, PLASMA  LACTIC ACID, PLASMA  URINALYSIS, COMPLETE (UACMP) WITH MICROSCOPIC   ____________________________________________  EKG  None ____________________________________________  RADIOLOGY  CT head, contacted by radiologist and informed negative for acute infarct  or abnormality ____________________________________________   PROCEDURES  Procedure(s) performed: No  Procedures   Critical Care performed: yes  CRITICAL CARE Performed by: Jene Every   Total critical care time: 30 minutes  Critical care time was exclusive of separately billable procedures and treating other patients.  Critical care was necessary to treat or prevent imminent or life-threatening deterioration.  Critical care was time spent personally by me on the following activities: development of treatment plan with patient and/or surrogate as well as nursing, discussions with consultants, evaluation of patient's response to treatment, examination of patient, obtaining history from patient or surrogate, ordering and performing treatments and interventions, ordering and review of laboratory studies, ordering and review of radiographic  studies, pulse oximetry and re-evaluation of patient's condition.  ____________________________________________   INITIAL IMPRESSION / ASSESSMENT AND PLAN / ED COURSE  Pertinent labs & imaging results that were available during my care of the patient were reviewed by me and considered in my medical decision making (see chart for details).  Patient presents with episode of difficulty walking, confusion per son.  Concern for possible CVA, onset at 830 however symptoms seem mostly improved now although son reports patient is slightly more confused than typical.  Code stroke called and CT obtained which is reassuring.  However patient did spike a fever while here in the emergency department and because of this decision made to cancel code stroke.  Patient does have a lymphocytopenia suspicious for COVID-19, differential also includes sepsis, pneumonia, urinary tract infection  Have added lactic and blood cultures and Covid swab  I have asked my colleague to follow-up on x-ray and Covid and lactic results, anticipate admission        ____________________________________________   FINAL CLINICAL IMPRESSION(S) / ED DIAGNOSES  Final diagnoses:  Suspected COVID-19 virus infection  Metabolic encephalopathy  Generalized weakness        Note:  This document was prepared using Dragon voice recognition software and may include unintentional dictation errors.   Jene Every, MD 10/24/19 2248

## 2019-10-24 NOTE — ED Provider Notes (Signed)
-----------------------------------------   11:00 PM on 10/24/2019 -----------------------------------------  Assuming care from Dr. Cyril Loosen.  In short, Robert Buckley is a 74 y.o. male with a chief complaint of possible stroke-like symptoms and fever.  Refer to the original H&P for additional details.  The current plan of care is to follow up COVID swab and remainder of workup.  Reassess.  Patient may be appropriate for discharge but unclear at this point.   ----------------------------------------- 1:42 AM on 10/25/2019 -----------------------------------------  The patient is getting 1 L normal saline for his elevated lactic acid which I do not believe represents traditional sepsis given that all the rest of his labs are normal and he is not tachycardic.  He is COVID-19 positive.  His resting oxygen saturation is typically in the lower 90s.  We did a trial of ambulation and he dropped down to 86% with minimal exertion.  Given his comorbidities, difficulty with ambulation and generalized weakness, increased confusion over baseline, and acute respiratory failure with hypoxemia with any degree of exertion, he meets criteria for inpatient treatment.  Son agrees with this plan.  I ordered remdesivir and Decadron 10 mg IV; even though his chest x-ray is currently clear I believe the Decadron may help.    I consulted the hospitalist for admission.    ----------------------------------------- 1:55 AM on 10/25/2019 -----------------------------------------  Discussed case by phone with Dr. Arville Care who will admit.   Loleta Rose, MD 10/25/19 [phone]

## 2019-10-24 NOTE — ED Notes (Signed)
Urinal at bedside, pt aware of need for UA, RN encouraging pt to void x2

## 2019-10-25 ENCOUNTER — Encounter: Payer: Self-pay | Admitting: Family Medicine

## 2019-10-25 DIAGNOSIS — I10 Essential (primary) hypertension: Secondary | ICD-10-CM

## 2019-10-25 DIAGNOSIS — I69351 Hemiplegia and hemiparesis following cerebral infarction affecting right dominant side: Secondary | ICD-10-CM | POA: Diagnosis not present

## 2019-10-25 DIAGNOSIS — N1831 Chronic kidney disease, stage 3a: Secondary | ICD-10-CM | POA: Diagnosis present

## 2019-10-25 DIAGNOSIS — Z7902 Long term (current) use of antithrombotics/antiplatelets: Secondary | ICD-10-CM | POA: Diagnosis not present

## 2019-10-25 DIAGNOSIS — G9341 Metabolic encephalopathy: Secondary | ICD-10-CM | POA: Diagnosis present

## 2019-10-25 DIAGNOSIS — Z87891 Personal history of nicotine dependence: Secondary | ICD-10-CM | POA: Diagnosis not present

## 2019-10-25 DIAGNOSIS — Z882 Allergy status to sulfonamides status: Secondary | ICD-10-CM | POA: Diagnosis not present

## 2019-10-25 DIAGNOSIS — U071 COVID-19: Principal | ICD-10-CM | POA: Diagnosis present

## 2019-10-25 DIAGNOSIS — N400 Enlarged prostate without lower urinary tract symptoms: Secondary | ICD-10-CM | POA: Diagnosis present

## 2019-10-25 DIAGNOSIS — E876 Hypokalemia: Secondary | ICD-10-CM | POA: Diagnosis present

## 2019-10-25 DIAGNOSIS — N3281 Overactive bladder: Secondary | ICD-10-CM | POA: Diagnosis present

## 2019-10-25 DIAGNOSIS — I129 Hypertensive chronic kidney disease with stage 1 through stage 4 chronic kidney disease, or unspecified chronic kidney disease: Secondary | ICD-10-CM | POA: Diagnosis present

## 2019-10-25 DIAGNOSIS — Z833 Family history of diabetes mellitus: Secondary | ICD-10-CM | POA: Diagnosis not present

## 2019-10-25 DIAGNOSIS — E785 Hyperlipidemia, unspecified: Secondary | ICD-10-CM | POA: Diagnosis present

## 2019-10-25 DIAGNOSIS — J9601 Acute respiratory failure with hypoxia: Secondary | ICD-10-CM | POA: Diagnosis present

## 2019-10-25 DIAGNOSIS — J1282 Pneumonia due to coronavirus disease 2019: Secondary | ICD-10-CM | POA: Diagnosis present

## 2019-10-25 DIAGNOSIS — Z79899 Other long term (current) drug therapy: Secondary | ICD-10-CM | POA: Diagnosis not present

## 2019-10-25 LAB — BLOOD CULTURE ID PANEL (REFLEXED) - BCID2

## 2019-10-25 LAB — URINE DRUG SCREEN, QUALITATIVE (ARMC ONLY)
Amphetamines, Ur Screen: NOT DETECTED
Barbiturates, Ur Screen: NOT DETECTED
Benzodiazepine, Ur Scrn: NOT DETECTED
Cannabinoid 50 Ng, Ur ~~LOC~~: NOT DETECTED
Cocaine Metabolite,Ur ~~LOC~~: NOT DETECTED
MDMA (Ecstasy)Ur Screen: NOT DETECTED
Methadone Scn, Ur: NOT DETECTED
Opiate, Ur Screen: NOT DETECTED
Phencyclidine (PCP) Ur S: NOT DETECTED
Tricyclic, Ur Screen: NOT DETECTED

## 2019-10-25 LAB — URINALYSIS, COMPLETE (UACMP) WITH MICROSCOPIC
Bacteria, UA: NONE SEEN
Bilirubin Urine: NEGATIVE
Glucose, UA: NEGATIVE mg/dL
Ketones, ur: NEGATIVE mg/dL
Leukocytes,Ua: NEGATIVE
Nitrite: NEGATIVE
Protein, ur: >=300 mg/dL — AB
Specific Gravity, Urine: 1.013 (ref 1.005–1.030)
pH: 5 (ref 5.0–8.0)

## 2019-10-25 LAB — LACTATE DEHYDROGENASE: LDH: 195 U/L — ABNORMAL HIGH (ref 98–192)

## 2019-10-25 LAB — FERRITIN: Ferritin: 163 ng/mL (ref 24–336)

## 2019-10-25 LAB — C-REACTIVE PROTEIN
CRP: 0.6 mg/dL (ref <1.0)
CRP: 0.8 mg/dL (ref <1.0)

## 2019-10-25 LAB — URINE CULTURE

## 2019-10-25 LAB — BRAIN NATRIURETIC PEPTIDE: B Natriuretic Peptide: 116.1 pg/mL — ABNORMAL HIGH (ref 0.0–100.0)

## 2019-10-25 LAB — PROCALCITONIN: Procalcitonin: <0.1 ng/mL

## 2019-10-25 LAB — LACTIC ACID, PLASMA: Lactic Acid, Venous: 1.8 mmol/L (ref 0.5–1.9)

## 2019-10-25 LAB — FIBRIN DERIVATIVES D-DIMER (ARMC ONLY): Fibrin derivatives D-dimer (ARMC): 691.29 ng/mL (FEU) — ABNORMAL HIGH (ref 0.00–499.00)

## 2019-10-25 LAB — ETHANOL: Alcohol, Ethyl (B): <10 mg/dL (ref <10)

## 2019-10-25 LAB — TROPONIN I (HIGH SENSITIVITY): Troponin I (High Sensitivity): 15 ng/L (ref <18)

## 2019-10-25 LAB — SARS CORONAVIRUS 2 BY RT PCR (HOSPITAL ORDER, PERFORMED IN ~~LOC~~ HOSPITAL LAB): SARS Coronavirus 2: POSITIVE — AB

## 2019-10-25 MED ORDER — FINASTERIDE 5 MG PO TABS
5.0000 mg | ORAL_TABLET | Freq: Every day | ORAL | Status: DC
  Administered 2019-10-25 – 2019-10-27 (×3): 5 mg via ORAL
  Filled 2019-10-25 (×4): qty 1

## 2019-10-25 MED ORDER — TRAZODONE HCL 50 MG PO TABS: 50.0000 mg | ORAL_TABLET | Freq: Every evening | ORAL | Status: DC | PRN

## 2019-10-25 MED ORDER — SODIUM CHLORIDE 0.9 % IV SOLN
100.0000 mg | Freq: Every day | INTRAVENOUS | Status: DC
  Administered 2019-10-26 – 2019-10-27 (×2): 100 mg via INTRAVENOUS
  Filled 2019-10-25 (×3): qty 20

## 2019-10-25 MED ORDER — HYDROCOD POLST-CPM POLST ER 10-8 MG/5ML PO SUER: 5.0000 mL | Freq: Two times a day (BID) | ORAL | Status: DC | PRN

## 2019-10-25 MED ORDER — ONDANSETRON HCL 4 MG/2ML IJ SOLN: 4.0000 mg | Freq: Four times a day (QID) | INTRAMUSCULAR | Status: DC | PRN

## 2019-10-25 MED ORDER — DARIFENACIN HYDROBROMIDE ER 7.5 MG PO TB24
7.5000 mg | ORAL_TABLET | Freq: Every day | ORAL | Status: DC
  Administered 2019-10-25 – 2019-10-27 (×3): 7.5 mg via ORAL
  Filled 2019-10-25 (×4): qty 1

## 2019-10-25 MED ORDER — GUAIFENESIN ER 600 MG PO TB12
600.0000 mg | ORAL_TABLET | Freq: Two times a day (BID) | ORAL | Status: DC
  Administered 2019-10-25 – 2019-10-27 (×5): 600 mg via ORAL
  Filled 2019-10-25 (×5): qty 1

## 2019-10-25 MED ORDER — SODIUM CHLORIDE 0.9 % IV SOLN: 100.0000 mg | Freq: Every day | INTRAVENOUS | Status: DC

## 2019-10-25 MED ORDER — SODIUM CHLORIDE 0.9 % IV SOLN: 200.0000 mg | Freq: Once | INTRAVENOUS | Status: DC

## 2019-10-25 MED ORDER — SODIUM CHLORIDE 0.9 % IV SOLN: INTRAVENOUS | Status: DC

## 2019-10-25 MED ORDER — BARICITINIB 2 MG PO TABS
2.0000 mg | ORAL_TABLET | Freq: Every day | ORAL | Status: DC
  Administered 2019-10-25 – 2019-10-27 (×3): 2 mg via ORAL
  Filled 2019-10-25 (×4): qty 1

## 2019-10-25 MED ORDER — ASPIRIN EC 81 MG PO TBEC
81.0000 mg | DELAYED_RELEASE_TABLET | Freq: Every day | ORAL | Status: DC
  Administered 2019-10-25 – 2019-10-27 (×3): 81 mg via ORAL
  Filled 2019-10-25 (×3): qty 1

## 2019-10-25 MED ORDER — SODIUM CHLORIDE 0.9 % IV SOLN
200.0000 mg | Freq: Once | INTRAVENOUS | Status: AC
  Administered 2019-10-25: 200 mg via INTRAVENOUS
  Filled 2019-10-25: qty 200

## 2019-10-25 MED ORDER — CLOPIDOGREL BISULFATE 75 MG PO TABS
75.0000 mg | ORAL_TABLET | Freq: Every day | ORAL | Status: DC
  Administered 2019-10-25 – 2019-10-27 (×3): 75 mg via ORAL
  Filled 2019-10-25 (×3): qty 1

## 2019-10-25 MED ORDER — ONDANSETRON HCL 4 MG PO TABS: 4.0000 mg | ORAL_TABLET | Freq: Four times a day (QID) | ORAL | Status: DC | PRN

## 2019-10-25 MED ORDER — POTASSIUM CHLORIDE 20 MEQ PO PACK
40.0000 meq | PACK | Freq: Once | ORAL | Status: DC
  Filled 2019-10-25: qty 2

## 2019-10-25 MED ORDER — ASCORBIC ACID 500 MG PO TABS
500.0000 mg | ORAL_TABLET | Freq: Every day | ORAL | Status: DC
  Administered 2019-10-25 – 2019-10-27 (×3): 500 mg via ORAL
  Filled 2019-10-25 (×3): qty 1

## 2019-10-25 MED ORDER — AMLODIPINE BESYLATE 5 MG PO TABS
7.5000 mg | ORAL_TABLET | Freq: Every day | ORAL | Status: DC
  Administered 2019-10-25 – 2019-10-27 (×3): 7.5 mg via ORAL
  Filled 2019-10-25 (×3): qty 2

## 2019-10-25 MED ORDER — METOPROLOL SUCCINATE ER 50 MG PO TB24
50.0000 mg | ORAL_TABLET | Freq: Every day | ORAL | Status: DC
  Administered 2019-10-25 – 2019-10-27 (×3): 50 mg via ORAL
  Filled 2019-10-25 (×3): qty 1

## 2019-10-25 MED ORDER — FAMOTIDINE 20 MG PO TABS
20.0000 mg | ORAL_TABLET | Freq: Every day | ORAL | Status: DC
  Administered 2019-10-25 – 2019-10-27 (×3): 20 mg via ORAL
  Filled 2019-10-25 (×4): qty 1

## 2019-10-25 MED ORDER — ACETAMINOPHEN 325 MG PO TABS: 650.0000 mg | ORAL_TABLET | Freq: Four times a day (QID) | ORAL | Status: DC | PRN

## 2019-10-25 MED ORDER — DEXAMETHASONE SODIUM PHOSPHATE 10 MG/ML IJ SOLN
10.0000 mg | Freq: Once | INTRAMUSCULAR | Status: AC
  Administered 2019-10-25: 10 mg via INTRAVENOUS
  Filled 2019-10-25: qty 1

## 2019-10-25 MED ORDER — VITAMIN D 25 MCG (1000 UNIT) PO TABS
1000.0000 [IU] | ORAL_TABLET | Freq: Every day | ORAL | Status: DC
  Administered 2019-10-25 – 2019-10-27 (×3): 1000 [IU] via ORAL
  Filled 2019-10-25 (×3): qty 1

## 2019-10-25 MED ORDER — MAGNESIUM HYDROXIDE 400 MG/5ML PO SUSP
30.0000 mL | Freq: Every day | ORAL | Status: DC | PRN
  Filled 2019-10-25: qty 30

## 2019-10-25 MED ORDER — ATORVASTATIN CALCIUM 20 MG PO TABS
40.0000 mg | ORAL_TABLET | Freq: Every day | ORAL | Status: DC
  Administered 2019-10-25: 40 mg via ORAL
  Filled 2019-10-25: qty 2

## 2019-10-25 MED ORDER — GUAIFENESIN-DM 100-10 MG/5ML PO SYRP
10.0000 mL | ORAL_SOLUTION | ORAL | Status: DC | PRN
  Filled 2019-10-25: qty 10

## 2019-10-25 MED ORDER — ZINC SULFATE 220 (50 ZN) MG PO CAPS
220.0000 mg | ORAL_CAPSULE | Freq: Every day | ORAL | Status: DC
  Administered 2019-10-25 – 2019-10-27 (×3): 220 mg via ORAL
  Filled 2019-10-25 (×2): qty 1

## 2019-10-25 MED ORDER — PREDNISONE 50 MG PO TABS: 50.0000 mg | ORAL_TABLET | Freq: Every day | ORAL | Status: DC

## 2019-10-25 MED ORDER — METHYLPREDNISOLONE SODIUM SUCC 125 MG IJ SOLR
80.0000 mg | Freq: Two times a day (BID) | INTRAMUSCULAR | Status: DC
  Administered 2019-10-25 – 2019-10-27 (×5): 80 mg via INTRAVENOUS
  Filled 2019-10-25 (×5): qty 2

## 2019-10-25 MED ORDER — ENOXAPARIN SODIUM 40 MG/0.4ML ~~LOC~~ SOLN
40.0000 mg | SUBCUTANEOUS | Status: DC
  Administered 2019-10-25 – 2019-10-27 (×2): 40 mg via SUBCUTANEOUS
  Filled 2019-10-25 (×2): qty 0.4

## 2019-10-25 NOTE — Progress Notes (Signed)
Remdesivir - Pharmacy Brief Note   O:  CXR: IMPRESSION: Streaky opacities in the right upper lobe and right infrahilar region, age indeterminate, but raise possibility of pneumonia. SpO2: 93 - 97% on RA   A/P:  Remdesivir 200 mg IVPB once followed by 100 mg IVPB daily x 4 days.   Thomasene Ripple, PharmD, BCPS Clinical Pharmacist 10/25/2019 1:49 AM

## 2019-10-25 NOTE — ED Notes (Signed)
Pt resting on R side with O2 sat 95-97% on RA. When supine pt desat to 88-89%. Pt educated on staying on side lying position or prone position for best oxygenation. Alert, remains oriented to self and place, disoriented to situation and year. Awaiting LR to finish infusing to obtain lactic level

## 2019-10-25 NOTE — ED Notes (Signed)
Pt provided with [place] sandwich tray and gingerale

## 2019-10-25 NOTE — Progress Notes (Signed)
PHARMACY - PHYSICIAN COMMUNICATION CRITICAL VALUE ALERT - BLOOD CULTURE IDENTIFICATION (BCID)  Robert Buckley is an 74 y.o. male who presented to [facility] on 10/24/2019 with a chief complaint of weakness, altered mental status, and difficulty with ambulation. Pt COVID +, febrile on admission, WBC WNL, and procal negative. BCx most likely contaminant - MD agreed.  Assessment:  1/4 bottles showing gram positive cocci - no organism identified  Name of physician (or Provider) Contacted: Dr. Georgeann Oppenheim  Current antibiotics: None  Changes to prescribed antibiotics recommended:  Recommended no change - likely contaminant  Results for orders placed or performed during the hospital encounter of 10/24/19  Blood Culture ID Panel (Reflexed) (Collected: 10/24/2019 10:36 PM)  Result Value Ref Range   Enterococcus faecalis NOT DETECTED NOT DETECTED   Enterococcus Faecium NOT DETECTED NOT DETECTED   Listeria monocytogenes NOT DETECTED NOT DETECTED   Staphylococcus species NOT DETECTED NOT DETECTED   Staphylococcus aureus (BCID) NOT DETECTED NOT DETECTED   Staphylococcus epidermidis NOT DETECTED NOT DETECTED   Staphylococcus lugdunensis NOT DETECTED NOT DETECTED   Streptococcus species NOT DETECTED NOT DETECTED   Streptococcus agalactiae NOT DETECTED NOT DETECTED   Streptococcus pneumoniae NOT DETECTED NOT DETECTED   Streptococcus pyogenes NOT DETECTED NOT DETECTED   A.calcoaceticus-baumannii NOT DETECTED NOT DETECTED   Bacteroides fragilis NOT DETECTED NOT DETECTED   Enterobacterales NOT DETECTED NOT DETECTED   Enterobacter cloacae complex NOT DETECTED NOT DETECTED   Escherichia coli NOT DETECTED NOT DETECTED   Klebsiella aerogenes NOT DETECTED NOT DETECTED   Klebsiella oxytoca NOT DETECTED NOT DETECTED   Klebsiella pneumoniae NOT DETECTED NOT DETECTED   Proteus species NOT DETECTED NOT DETECTED   Salmonella species NOT DETECTED NOT DETECTED   Serratia marcescens NOT DETECTED NOT DETECTED    Haemophilus influenzae NOT DETECTED NOT DETECTED   Neisseria meningitidis NOT DETECTED NOT DETECTED   Pseudomonas aeruginosa NOT DETECTED NOT DETECTED   Stenotrophomonas maltophilia NOT DETECTED NOT DETECTED   Candida albicans NOT DETECTED NOT DETECTED   Candida auris NOT DETECTED NOT DETECTED   Candida glabrata NOT DETECTED NOT DETECTED   Candida krusei NOT DETECTED NOT DETECTED   Candida parapsilosis NOT DETECTED NOT DETECTED   Candida tropicalis NOT DETECTED NOT DETECTED   Cryptococcus neoformans/gattii NOT DETECTED NOT DETECTED    Reatha Armour, PharmD Pharmacy Resident  10/25/2019 7:26 PM

## 2019-10-25 NOTE — H&P (Signed)
[place]   PATIENT NAME: Robert Buckley    MR#:  [id number]  DATE OF BIRTH:  [birth year]  DATE OF ADMISSION:  10/24/2019  PRIMARY CARE PHYSICIAN: Shelda Jakes, PA-C   REQUESTING/REFERRING PHYSICIAN: Loleta Rose, MD  CHIEF COMPLAINT:   Chief Complaint  Patient presents with  . Weakness    HISTORY OF PRESENT ILLNESS:  Robert Buckley  is a 74 y.o. African-American male with a known history of hypertension, CVA with right-sided weakness for which he uses a walker, stage III chronic kidney disease and osteoarthritis, who presented to the emergency room with acute onset of generalized weakness, altered mental status with confusion and difficulty with ambulation.  He was walking to his bed around 8:30 PM to get his close on and became very weak and drag his right leg more than usual.  He was having difficulty standing up and had mildly slurred speech he has been having dry cough but denied any fever or chills.  He denied any dyspnea or wheezing.  No loss of taste or smell.  No nausea or vomiting or diarrhea.  No rhinorrhea or nasal congestion or sore throat or earache.  He denies any body aches.  No reported fever or chills.  He has not been vaccinated against COVID-19.  His son has been having a recent cough but has not been tested yet for COVID-19  Upon presentation to the emergency room, temperature was 102 with a blood pressure 146/103 with otherwise normal sinus.  Pulse oximetry was initially 91 to 92% and upon ambulation dropped down to 86% on room air.  Labs revealed mild hypokalemia with potassium 3.4 and a creatinine 1.58 that is stable from 2 months ago.  (2.8 and CBC was unremarkable.  Urinalysis revealed more than 300 protein.  Blood cultures were drawn as well as urine culture.  His COVID-19 PCR came back positive  The patient was given 1 g of p.o. Tylenol, kilogram of IV Decadron IV remdesivir as well as 1 L bolus of IV lactated Ringer.  She will be admitted to a  medically monitored bed for further evaluation and management. PAST MEDICAL HISTORY:   Past Medical History:  Diagnosis Date  . Arthritis   . Hypertension   . Stroke [facility]) 2008   residual R sided weakness  Stage III chronic kidney disease  PAST SURGICAL HISTORY:  No past surgical history on file.  He denies any previous surgeries.  SOCIAL HISTORY:   Social History   Tobacco Use  . Smoking status: Never Smoker  . Smokeless tobacco: Never Used  Substance Use Topics  . Alcohol use: No  He quit smoking cigarettes 20 years ago.  FAMILY HISTORY:   Family History  Problem Relation Age of Onset  . Diabetes Mother     DRUG ALLERGIES:   Allergies  Allergen Reactions  . Sulfa Antibiotics Rash    REVIEW OF SYSTEMS:   ROS As per history of present illness. All pertinent systems were reviewed above. Constitutional, HEENT, cardiovascular, respiratory, GI, GU, musculoskeletal, neuro, psychiatric, endocrine, integumentary and hematologic systems were reviewed and are otherwise negative/unremarkable except for positive findings mentioned above in the HPI.   MEDICATIONS AT HOME:   Prior to Admission medications   Medication Sig Start Date End Date Taking? Authorizing Provider  amLODipine (NORVASC) 2.5 MG tablet Take 3 tablets (7.5 mg total) by mouth daily. 10/16/16   Angiulli, Mcarthur Rossetti, PA-C  atorvastatin (LIPITOR) 40 MG tablet Take 1 tablet (40 mg total)  by mouth daily at 6 PM. 10/16/16   Angiulli, Mcarthur Rossetti, PA-C  clopidogrel (PLAVIX) 75 MG tablet Take 1 tablet (75 mg total) by mouth daily. 10/16/16   Angiulli, Mcarthur Rossetti, PA-C  darifenacin (ENABLEX) 7.5 MG 24 hr tablet Take 1 tablet (7.5 mg total) by mouth daily. 10/16/16   Angiulli, Mcarthur Rossetti, PA-C  finasteride (PROSCAR) 5 MG tablet Take 1 tablet (5 mg total) by mouth daily. 10/16/16   Angiulli, Mcarthur Rossetti, PA-C  metoprolol succinate (TOPROL-XL) 50 MG 24 hr tablet Take 1 tablet (50 mg total) by mouth daily. Take with or immediately following a  meal. 10/16/16   Angiulli, Mcarthur Rossetti, PA-C  traMADol (ULTRAM) 50 MG tablet Take 1 tablet (50 mg total) by mouth every 8 (eight) hours as needed. 08/24/19   Tommi Rumps, PA-C      VITAL SIGNS:  Blood pressure 129/88, pulse 77, temperature 99.8 F (37.7 C), resp. rate 14, SpO2 100 %.  PHYSICAL EXAMINATION:  Physical Exam  GENERAL:  74 y.o.-year-old African-American male patient lying in the bed with mild conversational dyspnea. EYES: Pupils equal, round, reactive to light and accommodation. No scleral icterus. Extraocular muscles intact.  HEENT: Head atraumatic, normocephalic. Oropharynx and nasopharynx clear.  NECK:  Supple, no jugular venous distention. No thyroid enlargement, no tenderness.  LUNGS: Diminished bibasal breath sounds with bibasal crackles.   CARDIOVASCULAR: Regular rate and rhythm, S1, S2 normal. No murmurs, rubs, or gallops.  ABDOMEN: Soft, nondistended, nontender. Bowel sounds present. No organomegaly or mass.  EXTREMITIES: No pedal edema, cyanosis, or clubbing.  NEUROLOGIC: Cranial nerves II through XII are intact. Muscle strength 5/5 in all extremities. Sensation intact. Gait not checked.  PSYCHIATRIC: The patient is alert and oriented x 3.  Normal affect and good eye contact. SKIN: No obvious rash, lesion, or ulcer.   LABORATORY PANEL:   CBC Recent Labs  Lab 10/24/19 2201  WBC 5.9  HGB 15.5  HCT 43.8  PLT 152   ------------------------------------------------------------------------------------------------------------------  Chemistries  Recent Labs  Lab 10/24/19 2201  NA 146*  K 3.4*  CL 115*  CO2 19*  GLUCOSE 101*  BUN 14  CREATININE 1.58*  CALCIUM 8.1*  AST 28  ALT 28  ALKPHOS 97  BILITOT 1.0   ------------------------------------------------------------------------------------------------------------------  Cardiac Enzymes No results for input(s): TROPONINI in the last 168  hours. ------------------------------------------------------------------------------------------------------------------  RADIOLOGY:  DG Chest 1 View  Result Date: 10/24/2019 CLINICAL DATA:  Fever and weakness EXAM: CHEST  1 VIEW COMPARISON:  None. FINDINGS: Left lung is grossly clear. Streaky right upper lobe and right infrahilar opacities. Normal heart size. No pneumothorax. IMPRESSION: Streaky opacities in the right upper lobe and right infrahilar region, age indeterminate, but raise possibility of pneumonia. Electronically Signed   By: Jasmine Pang M.D.   On: 10/24/2019 23:07   CT HEAD CODE STROKE WO CONTRAST  Result Date: 10/24/2019 CLINICAL DATA:  Code stroke. Initial evaluation for acute left-sided weakness. EXAM: CT HEAD WITHOUT CONTRAST TECHNIQUE: Contiguous axial images were obtained from the base of the skull through the vertex without intravenous contrast. COMPARISON:  Prior MRI from 09/29/2016. FINDINGS: Brain: Age-related cerebral atrophy with advanced chronic microvascular ischemic disease. Scattered remote lacunar infarcts noted about the bilateral basal ganglia. No acute intracranial hemorrhage. No acute large vessel territory infarct. No mass lesion, midline shift or mass effect. Diffuse ventricular prominence related global parenchymal volume loss without hydrocephalus. No extra-axial fluid collection. Vascular: No hyperdense vessel. Scattered vascular calcifications about the carotid siphons. Skull: Scalp soft tissues  within normal limits.  Calvarium intact. Sinuses/Orbits: Globes and orbital soft tissues within normal limits. Visualized paranasal sinuses are largely clear. No mastoid effusion. Other: None. ASPECTS [facility]([place] Stroke Program Early CT Score) - Ganglionic level infarction (caudate, lentiform nuclei, internal capsule, insula, M1-M3 cortex): 7 - Supraganglionic infarction (M4-M6 cortex): 3 Total score (0-10 with 10 being normal): 10 IMPRESSION: 1. No acute intracranial  infarct or other abnormality. 2. ASPECTS is 10. 3. Moderately advanced age-related cerebral atrophy with chronic small vessel ischemic disease. Critical Value/emergent results were called by telephone at the time of interpretation on 10/24/2019 at 10:32 pm to provider Jene EveryOBERT KINNER , who verbally acknowledged these results. Electronically Signed   By: Rise MuBenjamin  McClintock M.D.   On: 10/24/2019 22:32      IMPRESSION AND PLAN:   1.  Acute hypoxemic respiratory failure secondary to COVID-19. -The patient will be admitted to a medically monitored isolation bed. -O2 protocol will be followed to keep O2 saturation above 93.   2.  Multifocal pneumonia secondary to COVID-19. -The patient will be admitted to an isolation monitored bed with droplet and contact precautions. -Given multifocal pneumonia we will empirically place the patient on IV Rocephin and Zithromax for possible bacterial superinfection only with elevated Procalcitonin. -The patient will be placed on scheduled Mucinex and as needed Tussionex. -We will avoid nebulization as much as we can, give bronchodilator MDI if needed, and with deterioration of oxygenation try to avoid BiPAP/CPAP if possible.    -Will obtain sputum Gram stain culture and sensitivity and follow blood cultures. -O2 protocol will be followed. -We will follow CRP, ferritin, LDH and D-dimer. -Will follow manual differential for ANC/ALC ratio as well as follow troponin I and daily CBC with manual differential and CMP. - Will place the patient on IV Remdesivir and IV steroid therapy with Decadron with elevated inflammatory markers. -The patient will be placed on vitamin D3, vitamin C, zinc sulfate, p.o. Pepcid and aspirin. -I discussed the baricitinib with the patient and he agreed to proceed with it.  3. Hypokalemia. -Potassium will be replaced and magnesium level will be checked.  4. Essential hypertension. -We will continue Norvasc and Toprol-XL. -We will hold off  ramipril.   5. Stage III chronic kidney disease. -Patient be hydrated with IV normal saline we will follow his BMP.   6.  Dyslipidemia. -We will continue statin therapy.  7.  Overactive bladder. -We will continue oxybutynin.  8.  History of CVA. -We will continue Plavix and statin therapy.  9.  BPH. -Continue Proscar.  10.  DVT prophylaxis. -Subcutaneous Lovenox   All the records are reviewed and case discussed with ED provider. The plan of care was discussed in details with the patient (and family). I answered all questions. The patient agreed to proceed with the above mentioned plan. Further management will depend upon hospital course.   CODE STATUS: Full code  Status is: Inpatient  Remains inpatient appropriate because:Altered mental status, Ongoing diagnostic testing needed not appropriate for outpatient work up, Unsafe d/c plan, IV treatments appropriate due to intensity of illness or inability to take PO and Inpatient level of care appropriate due to severity of illness   Dispo: The patient is from: Home              Anticipated d/c is to: Home              Anticipated d/c date is: 3 days  Patient currently is not medically stable to d/c.   TOTAL TIME TAKING CARE OF THIS PATIENT: 55 minutes.    Hannah Beat M.D on 10/25/2019 at 2:09 AM  [facility]   From 7 PM-7 AM, contact night-coverage [email or web address]  CC: Primary care physician; Shelda Jakes, PA-C   Note: This dictation was prepared with Dragon dictation along with smaller phrase technology. Any transcriptional typo errors that result from this process are unintentional.

## 2019-10-25 NOTE — Progress Notes (Signed)
Brief hospitalist update note.  This is a nonbillable note.  Please see same-day H&P for full billable details.  75 year old African-American male with history of hypertension, CVA with residual right-sided weakness, stage III a chronic kidney disease who presents for evaluation of increasing weakness.  Found to be Covid positive.  Patient is unvaccinated.  Presentation relatively mild at this time..  Patient requiring 2 L supplemental oxygen.  Continue current standard of care.  Remdesivir, steroids, supplemental oxygen.  Can DC baricitinib.  Wean oxygen as tolerated.  Stress proning.  Incentive spirometry use.  Lolita Patella MD

## 2019-10-26 LAB — CBC WITH DIFFERENTIAL/PLATELET
Abs Immature Granulocytes: 0.02 10*3/uL (ref 0.00–0.07)
Basophils Absolute: 0 10*3/uL (ref 0.0–0.1)
Basophils Relative: 0 %
Eosinophils Absolute: 0 10*3/uL (ref 0.0–0.5)
Eosinophils Relative: 0 %
HCT: 41.1 % (ref 39.0–52.0)
Hemoglobin: 14.5 g/dL (ref 13.0–17.0)
Immature Granulocytes: 0 %
Lymphocytes Relative: 13 %
Lymphs Abs: 0.9 10*3/uL (ref 0.7–4.0)
MCH: 31.3 pg (ref 26.0–34.0)
MCHC: 35.3 g/dL (ref 30.0–36.0)
MCV: 88.8 fL (ref 80.0–100.0)
Monocytes Absolute: 0.7 10*3/uL (ref 0.1–1.0)
Monocytes Relative: 10 %
Neutro Abs: 5.4 10*3/uL (ref 1.7–7.7)
Neutrophils Relative %: 77 %
Platelets: 180 10*3/uL (ref 150–400)
RBC: 4.63 MIL/uL (ref 4.22–5.81)
RDW: 13.7 % (ref 11.5–15.5)
WBC: 7 10*3/uL (ref 4.0–10.5)
nRBC: 0 % (ref 0.0–0.2)

## 2019-10-26 LAB — COMPREHENSIVE METABOLIC PANEL
ALT: 30 U/L (ref 0–44)
AST: 32 U/L (ref 15–41)
Albumin: 3.3 g/dL — ABNORMAL LOW (ref 3.5–5.0)
Alkaline Phosphatase: 88 U/L (ref 38–126)
Anion gap: 9 (ref 5–15)
BUN: 24 mg/dL — ABNORMAL HIGH (ref 8–23)
CO2: 22 mmol/L (ref 22–32)
Calcium: 8.5 mg/dL — ABNORMAL LOW (ref 8.9–10.3)
Chloride: 111 mmol/L (ref 98–111)
Creatinine, Ser: 1.66 mg/dL — ABNORMAL HIGH (ref 0.61–1.24)
GFR calc Af Amer: 47 mL/min — ABNORMAL LOW (ref >60)
GFR calc non Af Amer: 40 mL/min — ABNORMAL LOW (ref >60)
Glucose, Bld: 101 mg/dL — ABNORMAL HIGH (ref 70–99)
Potassium: 4.2 mmol/L (ref 3.5–5.1)
Sodium: 142 mmol/L (ref 135–145)
Total Bilirubin: 0.5 mg/dL (ref 0.3–1.2)
Total Protein: 6.6 g/dL (ref 6.5–8.1)

## 2019-10-26 LAB — FIBRIN DERIVATIVES D-DIMER (ARMC ONLY): Fibrin derivatives D-dimer (ARMC): 627.17 ng/mL (FEU) — ABNORMAL HIGH (ref 0.00–499.00)

## 2019-10-26 LAB — TROPONIN I (HIGH SENSITIVITY): Troponin I (High Sensitivity): 11 ng/L (ref <18)

## 2019-10-26 LAB — FERRITIN: Ferritin: 238 ng/mL (ref 24–336)

## 2019-10-26 LAB — MAGNESIUM: Magnesium: 1.9 mg/dL (ref 1.7–2.4)

## 2019-10-26 LAB — C-REACTIVE PROTEIN: CRP: 1 mg/dL — ABNORMAL HIGH (ref <1.0)

## 2019-10-26 NOTE — Discharge Planning (Signed)
Son, Tasia Catchings, notified by Dr and RN about high probability of DC to home for 9/15.  Son agreed if patient remains stable.

## 2019-10-26 NOTE — Evaluation (Signed)
Occupational Therapy Evaluation Patient Details Name: Robert Buckley MRN: [id number] DOB: 06-29-1945 Today's Date: 10/26/2019    History of Present Illness Robert Buckley is a 74 y/o M w/ known h/o HTN, CVA with right-sided weakness (for which he uses a rollator), stage III CKD and osteoarthritis, who presented to the ED w/ acute onset of generalized weakness, altered mental status, and difficulty with ambulation. Pt with + COVID test as well as desaturation with amb.   Clinical Impression   Pt was seen for OT evaluation this date. Prior to hospital admission, pt was MOD I for fxl mobility with rollator, MOD I with toileting and grooming and required some assist from sons for bathing.LB dressing and HH IADLs such as cooking/cleaning. Pt lives with two sons in one level apartment and sons have alternate work schedules (day/night) so that someone is with patient at all times. Currently pt demonstrates impairments as described below (See OT problem list) which functionally limit his ability to perform ADL/self-care tasks. Pt currently requires MIN A/CGA for ADL Trasnfers and fxl mobility with RW as well as MOD/MAX A for LB ADLs.  Pt would benefit from skilled OT to address noted impairments and functional limitations (see below for any additional details) in order to maximize safety and independence while minimizing falls risk and caregiver burden. Upon hospital discharge, recommend HHOT with 24/7 SUPV to maximize pt safety and return to functional independence during meaningful occupations of daily life.     Follow Up Recommendations  Home health OT;Supervision/Assistance - 24 hour    Equipment Recommendations  3 in 1 bedside commode;Tub/shower seat    Recommendations for Other Services       Precautions / Restrictions Precautions Precautions: Fall Restrictions Weight Bearing Restrictions: No      Mobility Bed Mobility Overal bed mobility: Needs Assistance Bed Mobility: Supine to Sit      Supine to sit: Min guard;HOB elevated     General bed mobility comments: increased time, HOB elevated  Transfers Overall transfer level: Needs assistance Equipment used: Rolling walker (2 wheeled) Transfers: Sit to/from Stand Sit to Stand: Min guard;Min assist         General transfer comment: requires MIN verbal cues for safe hand placement with RW use for fxl mobility and ADL transfers.    Balance Overall balance assessment: Needs assistance   Sitting balance-Leahy Scale: Good       Standing balance-Leahy Scale: Fair Standing balance comment: benefits from UE support on RW to perform fxl mobility                           ADL either performed or assessed with clinical judgement   ADL Overall ADL's : Needs assistance/impaired                                       General ADL Comments: MIN A/CGA with RW for fxl mobility and ADL transfers, MOD/MAX A for LB ADLs, SETUP for seated UB ADLs.     Vision Patient Visual Report: No change from baseline       Perception     Praxis      Pertinent Vitals/Pain Pain Assessment: No/denies pain     Hand Dominance     Extremity/Trunk Assessment Upper Extremity Assessment Upper Extremity Assessment: RUE deficits/detail RUE Deficits / Details: chronically weak R UE prom previous stroke. Some spasticity with  extended digits of R hand, requiring increased time to grip handle of RW.   Lower Extremity Assessment Lower Extremity Assessment: Defer to PT evaluation RLE Deficits / Details: weak, dragging. Son reports that pt uses AFO for fxl mobiltiy outside of the home at baseline, but does not use in the home.   Cervical / Trunk Assessment Cervical / Trunk Assessment: Normal   Communication Communication Communication: HOH   Cognition Arousal/Alertness: Awake/alert Behavior During Therapy: WFL for tasks assessed/performed Overall Cognitive Status: No family/caregiver present to determine  baseline cognitive functioning                                 General Comments: Pt appropriate with following simple one step commands. Sometimes incorrectly answers therapist with PLOF information, potentially 2/2 HOH. Pt mostly appropraite with orientation, but requires increased processing time. Does not know exact day/date, but does answer correct month/year with increase prompt/time to process.   General Comments       Exercises Other Exercises Other Exercises: OT facilitates ed re: role of OT, safety considerations, call light use, notified pt that OT placed chair alarm.   Shoulder Instructions      Home Living Family/patient expects to be discharged to:: Private residence Living Arrangements: Children (sons who have alternate work schedules day/night so that pt has supervision at all times.) Available Help at Discharge: Family Type of Home: Apartment Home Access: Level entry     Home Layout: One level     Bathroom Shower/Tub: Producer, television/film/video: Handicapped height Bathroom Accessibility: Yes   Home Equipment: Shower seat;Grab bars - tub/shower;Walker - 4 wheels;Hand held shower head          Prior Functioning/Environment Level of Independence: Needs assistance  Gait / Transfers Assistance Needed: [phone] for household distance AMB ADL's / Homemaking Assistance Needed: assisted by his sons to get in and out of tub as well as LB dressing. States he is able to toilet, groom and feed himself and his sons confirm this via telephone            OT Problem List: Decreased strength;Decreased range of motion;Decreased activity tolerance;Impaired balance (sitting and/or standing);Decreased coordination;Decreased knowledge of use of DME or AE;Cardiopulmonary status limiting activity      OT Treatment/Interventions: Self-care/ADL training;Therapeutic exercise;Energy conservation;DME and/or AE instruction;Therapeutic activities;Balance  training;Patient/family education    OT Goals(Current goals can be found in the care plan section) Acute Rehab OT Goals Patient Stated Goal: to be able to get around like I was before OT Goal Formulation: With patient Time For Goal Achievement: 11/09/19 Potential to Achieve Goals: Good ADL Goals Pt Will Perform Grooming: with set-up;sitting (standing as needed with RW to obtain grooming ADL items) Pt Will Transfer to Toilet: with supervision;ambulating (RW to commode in restroom with use of grab bars as necessary.) Pt Will Perform Toileting - Clothing Manipulation and hygiene: with supervision;sit to/from stand  OT Frequency: Min 1X/week   Barriers to D/C:            Co-evaluation              AM-PAC OT "6 Clicks" Daily Activity     Outcome Measure Help from another person eating meals?: None Help from another person taking care of personal grooming?: A Little Help from another person toileting, which includes using toliet, bedpan, or urinal?: A Little Help from another person bathing (including washing, rinsing, drying)?: A Lot  Help from another person to put on and taking off regular upper body clothing?: A Little Help from another person to put on and taking off regular lower body clothing?: A Lot 6 Click Score: 17   End of Session Equipment Utilized During Treatment: Gait belt;Rolling walker Nurse Communication: Mobility status  Activity Tolerance: Patient tolerated treatment well Patient left: in chair;with call bell/phone within reach;with chair alarm set  OT Visit Diagnosis: Unsteadiness on feet (R26.81);Muscle weakness (generalized) (M62.81);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

## 2019-10-26 NOTE — Progress Notes (Signed)
PROGRESS NOTE    Robert JewsCharles Buckley  ZOX:[id number] DOB: Sep 27, 1945 DOA: 10/24/2019 PCP: Robert Buckley  Brief Narrative:  74 year old African-American male with history of hypertension, CVA with residual right-sided weakness, stage III a chronic kidney disease who presents for evaluation of increasing weakness.  Found to be Covid positive.  Patient is unvaccinated.  Presentation relatively mild at this time..  Patient requiring 2 L supplemental oxygen.  Continue current standard of care.  Remdesivir, steroids, supplemental oxygen.  Can DC baricitinib.  Wean oxygen as tolerated.  Stress proning.  Incentive spirometry use.  9/14: Patient seen and examined.  Weaned from supplemental oxygen last night.  Still appears rather weak and deconditioned but may be close to baseline.   Assessment & Plan:   Active Problems:   Pneumonia due to COVID-19 virus  Pneumonia secondary to COVID-19 Acute hypoxemic respiratory failure secondary to above Patient had evidence of a multifocal pneumonia on x-ray Has been weaned from supplemental oxygen but still appears weak and deconditioned Plan: Continue remdesivir Continue steroids Supplemental oxygen as needed, wean as tolerated Prone as tolerated Stressed I-S and flutter use No Lasix today Albuterol MDI as needed Supplemental vitamins Therapy evaluations.  Out of bed to chair.  Ambulate as tolerated.  Anticipate discharge home on 10/27/2019.  We will set up outpatient remdesivir post discharge.  Hypokalemia Improved, check daily labs and replete as necessary  Essential hypertension Continue Norvasc Continue metoprolol Hold ramipril  Stage IIIa chronic kidney disease No IV fluids Creatinine at baseline  Hyperlipidemia Hold statin while on remdesivir  BPH Overactive bladder Continue Flomax and oxybutynin  History of CVA On Plavix    DVT prophylaxis: Lovenox Code Status: Full Family Communication: Robert Buckley via phone  [phone] on 10/26/2019 Disposition Plan: Status is: Inpatient  Remains inpatient appropriate because:Inpatient level of care appropriate due to severity of illness   Dispo: The patient is from: Home              Anticipated d/c is to: Home              Anticipated d/c date is: 1 day              Patient currently is not medically stable to d/c.   Weaned supplemental oxygen last night.  Still appears weak and deconditioned.  We will plan on 24 hours of additional inpatient monitoring.  Anticipate discharge home on 10/27/2019.     Consultants:   None  Procedures:  None   Antimicrobials: Remdesivir   Subjective: Seen and examined.  Complains of some mild fatigue  Objective: Vitals:   10/25/19 2318 10/26/19 0500 10/26/19 1000 10/26/19 1224  BP: (!) 141/84 (!) 146/85 (!) 147/83   Pulse: (!) 56 (!) 58 (!) 54   Resp: 17 15 14    Temp: 98.6 F (37 C) 97.9 F (36.6 C)    TempSrc: Oral     SpO2: 100% 97% 97% 94%  Weight:       No intake or output data in the 24 hours ending 10/26/19 1251 Filed Weights   10/25/19 0222  Weight: 81.6 kg    Examination:  General exam: No acute distress.  Appears weak and deconditioned Respiratory system: Bibasilar crackles.  Normal work of breathing.  Room air  cardiovascular system: S1 & S2 heard, RRR. No JVD, murmurs, rubs, gallops or clicks. No pedal edema. Gastrointestinal system: Abdomen is nondistended, soft and nontender. No organomegaly or masses felt. Normal bowel sounds heard. Central nervous system: Alert  and oriented x2. No focal neurological deficits. Extremities: Symmetric 5 x 5 power. Skin: No rashes, lesions or ulcers Psychiatry: Judgement and insight appear normal. Mood & affect appropriate.     Data Reviewed: I have personally reviewed following labs and imaging studies  CBC: Recent Labs  Lab 10/24/19 2201 10/26/19 0337  WBC 5.9 7.0  NEUTROABS 4.6 5.4  HGB 15.5 14.5  HCT 43.8 41.1  MCV 89.4 88.8  PLT  152 180   Basic Metabolic Panel: Recent Labs  Lab 10/24/19 2201 10/26/19 0337  NA 146* 142  K 3.4* 4.2  CL 115* 111  CO2 19* 22  GLUCOSE 101* 101*  BUN 14 24*  CREATININE 1.58* 1.66*  CALCIUM 8.1* 8.5*  MG  --  1.9   GFR: Estimated Creatinine Clearance: 40.5 mL/min (A) (by C-G formula based on SCr of 1.66 mg/dL (H)). Liver Function Tests: Recent Labs  Lab 10/24/19 2201 10/26/19 0337  AST 28 32  ALT 28 30  ALKPHOS 97 88  BILITOT 1.0 0.5  PROT 7.1 6.6  ALBUMIN 3.7 3.3*   No results for input(s): LIPASE, AMYLASE in the last 168 hours. No results for input(s): AMMONIA in the last 168 hours. Coagulation Profile: Recent Labs  Lab 10/24/19 2201  INR 1.0   Cardiac Enzymes: No results for input(s): CKTOTAL, CKMB, CKMBINDEX, TROPONINI in the last 168 hours. BNP (last 3 results) No results for input(s): PROBNP in the last 8760 hours. HbA1C: No results for input(s): HGBA1C in the last 72 hours. CBG: Recent Labs  Lab 10/24/19 2135  GLUCAP 114*   Lipid Profile: No results for input(s): CHOL, HDL, LDLCALC, TRIG, CHOLHDL, LDLDIRECT in the last 72 hours. Thyroid Function Tests: No results for input(s): TSH, T4TOTAL, FREET4, T3FREE, THYROIDAB in the last 72 hours. Anemia Panel: Recent Labs    10/25/19 0200 10/26/19 0337  FERRITIN 163 238   Sepsis Labs: Recent Labs  Lab 10/24/19 2236 10/25/19 0200  PROCALCITON  --  <0.10  LATICACIDVEN 2.8* 1.8    Recent Results (from the past 240 hour(s))  SARS Coronavirus 2 by RT PCR (hospital order, performed in [facility] hospital lab) Nasopharyngeal Nasopharyngeal Swab     Status: Abnormal   Collection Time: 10/24/19 10:01 PM   Specimen: Nasopharyngeal Swab  Result Value Ref Range Status   SARS Coronavirus 2 POSITIVE (A) NEGATIVE Final    Comment: RESULT CALLED TO, READ BACK BY AND VERIFIED WITH: Robert Buckley 10/25/19 AT 0006 HS (NOTE) SARS-CoV-2 target nucleic acids are DETECTED  SARS-CoV-2 RNA is generally  detectable in upper respiratory specimens  during the acute phase of infection.  Positive results are indicative  of the presence of the identified virus, but do not rule out bacterial infection or co-infection with other pathogens not detected by the test.  Clinical correlation with patient history and  other diagnostic information is necessary to determine patient infection status.  The expected result is negative.  Fact Sheet for Patients:   BoilerBrush.com.cy   Fact Sheet for Healthcare Providers:   https://pope.com/    This test is not yet approved or cleared by the [place] FDA and  has been authorized for detection and/or diagnosis of SARS-CoV-2 by FDA under an Emergency Use Authorization (EUA).  This EUA will remain in effect (meaning this t est can be used) for the duration of  the COVID-19 declaration under Section 564(b)(1) of the Act, 21 U.S.C. section 360-bbb-3(b)(1), unless the authorization is terminated or revoked sooner.  Performed at [facility]  [facility], [address] [place], [place] [address]   Blood Culture (routine x 2)     Status: None (Preliminary result)   Collection Time: 10/24/19 10:36 PM   Specimen: BLOOD  Result Value Ref Range Status   Specimen Description BLOOD LEFT HAND  Final   Special Requests   Final    BOTTLES DRAWN AEROBIC AND ANAEROBIC Blood Culture results may not be optimal due to an inadequate volume of blood received in culture bottles   Culture  Setup Time   Final    AEROBIC BOTTLE ONLY Organism ID to follow GRAM POSITIVE COCCI CRITICAL RESULT CALLED TO, READ BACK BY AND VERIFIED WITH: MORGAN CUNNINGHAM 10/25/19 AT 1722 HS Performed at [facility], [address] [place], [place] [address]    Culture GRAM POSITIVE COCCI  Final   Report Status PENDING  Incomplete  Blood Culture (routine x 2)     Status: None (Preliminary result)   Collection Time: 10/24/19 10:36 PM     Specimen: BLOOD  Result Value Ref Range Status   Specimen Description BLOOD LEFT HAND  Final   Special Requests   Final    BOTTLES DRAWN AEROBIC AND ANAEROBIC Blood Culture results may not be optimal due to an inadequate volume of blood received in culture bottles   Culture   Final    NO GROWTH 2 DAYS Performed at [facility], [address] [place], [place] [address]    Report Status PENDING  Incomplete  Blood Culture ID Panel (Reflexed)     Status: None   Collection Time: 10/24/19 10:36 PM  Result Value Ref Range Status   Enterococcus faecalis NOT DETECTED NOT DETECTED Final   Enterococcus Faecium NOT DETECTED NOT DETECTED Final   Listeria monocytogenes NOT DETECTED NOT DETECTED Final   Staphylococcus species NOT DETECTED NOT DETECTED Final   Staphylococcus aureus (BCID) NOT DETECTED NOT DETECTED Final   Staphylococcus epidermidis NOT DETECTED NOT DETECTED Final   Staphylococcus lugdunensis NOT DETECTED NOT DETECTED Final   Streptococcus species NOT DETECTED NOT DETECTED Final   Streptococcus agalactiae NOT DETECTED NOT DETECTED Final   Streptococcus pneumoniae NOT DETECTED NOT DETECTED Final   Streptococcus pyogenes NOT DETECTED NOT DETECTED Final   A.calcoaceticus-baumannii NOT DETECTED NOT DETECTED Final   Bacteroides fragilis NOT DETECTED NOT DETECTED Final   Enterobacterales NOT DETECTED NOT DETECTED Final   Enterobacter cloacae complex NOT DETECTED NOT DETECTED Final   Escherichia coli NOT DETECTED NOT DETECTED Final   Klebsiella aerogenes NOT DETECTED NOT DETECTED Final   Klebsiella oxytoca NOT DETECTED NOT DETECTED Final   Klebsiella pneumoniae NOT DETECTED NOT DETECTED Final   Proteus species NOT DETECTED NOT DETECTED Final   Salmonella species NOT DETECTED NOT DETECTED Final   Serratia marcescens NOT DETECTED NOT DETECTED Final   Haemophilus influenzae NOT DETECTED NOT DETECTED Final   Neisseria meningitidis NOT DETECTED NOT DETECTED Final   Pseudomonas  aeruginosa NOT DETECTED NOT DETECTED Final   Stenotrophomonas maltophilia NOT DETECTED NOT DETECTED Final   Candida albicans NOT DETECTED NOT DETECTED Final   Candida auris NOT DETECTED NOT DETECTED Final   Candida glabrata NOT DETECTED NOT DETECTED Final   Candida krusei NOT DETECTED NOT DETECTED Final   Candida parapsilosis NOT DETECTED NOT DETECTED Final   Candida tropicalis NOT DETECTED NOT DETECTED Final   Cryptococcus neoformans/gattii NOT DETECTED NOT DETECTED Final    Comment: Performed at [facility], [address] [place], [place] [address]  Urine culture  Status: Abnormal   Collection Time: 10/25/19  1:34 AM   Specimen: In/Out Cath Urine  Result Value Ref Range Status   Specimen Description   Final    IN/OUT CATH URINE Performed at [facility], [address] [place], [place] [address]    Special Requests   Final    NONE Performed at [facility], [address] [place], [place] [address]    Culture MULTIPLE SPECIES PRESENT, SUGGEST RECOLLECTION (A)  Final   Report Status 10/25/2019 FINAL  Final         Radiology Studies: DG Chest 1 View  Result Date: 10/24/2019 CLINICAL DATA:  Fever and weakness EXAM: CHEST  1 VIEW COMPARISON:  None. FINDINGS: Left lung is grossly clear. Streaky right upper lobe and right infrahilar opacities. Normal heart size. No pneumothorax. IMPRESSION: Streaky opacities in the right upper lobe and right infrahilar region, age indeterminate, but raise possibility of pneumonia. Electronically Signed   By: Jasmine Pang M.D.   On: 10/24/2019 23:07   CT HEAD CODE STROKE WO CONTRAST  Result Date: 10/24/2019 CLINICAL DATA:  Code stroke. Initial evaluation for acute left-sided weakness. EXAM: CT HEAD WITHOUT CONTRAST TECHNIQUE: Contiguous axial images were obtained from the base of the skull through the vertex without intravenous contrast. COMPARISON:  Prior MRI from 09/29/2016. FINDINGS: Brain: Age-related  cerebral atrophy with advanced chronic microvascular ischemic disease. Scattered remote lacunar infarcts noted about the bilateral basal ganglia. No acute intracranial hemorrhage. No acute large vessel territory infarct. No mass lesion, midline shift or mass effect. Diffuse ventricular prominence related global parenchymal volume loss without hydrocephalus. No extra-axial fluid collection. Vascular: No hyperdense vessel. Scattered vascular calcifications about the carotid siphons. Skull: Scalp soft tissues within normal limits.  Calvarium intact. Sinuses/Orbits: Globes and orbital soft tissues within normal limits. Visualized paranasal sinuses are largely clear. No mastoid effusion. Other: None. ASPECTS [facility] Stroke Program Early CT Score) - Ganglionic level infarction (caudate, lentiform nuclei, internal capsule, insula, M1-M3 cortex): 7 - Supraganglionic infarction (M4-M6 cortex): 3 Total score (0-10 with 10 being normal): 10 IMPRESSION: 1. No acute intracranial infarct or other abnormality. 2. ASPECTS is 10. 3. Moderately advanced age-related cerebral atrophy with chronic small vessel ischemic disease. Critical Value/emergent results were called by telephone at the time of interpretation on 10/24/2019 at 10:32 pm to provider Jene Every , who verbally acknowledged these results. Electronically Signed   By: Rise Mu M.D.   On: 10/24/2019 22:32        Scheduled Meds: . amLODipine  7.5 mg Oral Daily  . vitamin C  500 mg Oral Daily  . aspirin EC  81 mg Oral Daily  . atorvastatin  40 mg Oral q1800  . baricitinib  2 mg Oral Daily  . cholecalciferol  1,000 Units Oral Daily  . clopidogrel  75 mg Oral Daily  . darifenacin  7.5 mg Oral Daily  . enoxaparin (LOVENOX) injection  40 mg Subcutaneous Q24H  . famotidine  20 mg Oral Daily  . finasteride  5 mg Oral Daily  . guaiFENesin  600 mg Oral BID  . methylPREDNISolone (SOLU-MEDROL) injection  80 mg Intravenous Q12H   Followed by  . [START  ON 10/28/2019] predniSONE  50 mg Oral Daily  . metoprolol succinate  50 mg Oral Daily  . potassium chloride  40 mEq Oral Once  . zinc sulfate  220 mg Oral Daily   Continuous Infusions: . remdesivir 100 mg in NS 100 mL 100 mg (10/26/19 1006)  LOS: 1 day    Time spent: 25 minutes    Tresa Moore, MD [facility] Pager [phone]  If 7PM-7AM, please contact night-coverage 10/26/2019, 12:51 PM

## 2019-10-26 NOTE — TOC Initial Note (Signed)
Transition of Care [facility]) - Initial/Assessment Note    Patient Details  Name: Robert Buckley MRN: [id number] Date of Birth: Oct 19, 1945  [facility] CM/SW Contact:    Allayne Butcher, RN Phone Number: 10/26/2019, 2:41 PM  Clinical Narrative:                 Patient admitted with COVID and metabolic encephalopathy.  RNCM received a call from the patient's son Tasia Catchings.  Patient lives with Tasia Catchings and his family.  Patient is mostly independent at home but he does little activity.  Patient's son reports that the patient usually get up in the morning gets dressed and then goes to the living room and watches TV all day.  Patient does have a walker and lift chair at home.  The son also reports that they have arm grab bars and a camera so that if the patient falls and they are not there they can get help for him.  Patient's son would like for the patient to go home with home health services.  [place] [facility] has accepted referral for home health RN, PT, OT, and aide.  Plan is for DC tomorrow.   Expected Discharge Plan: Home w Home Health Services Barriers to Discharge: Continued Medical Work up   Patient Goals and CMS Choice Patient states their goals for this hospitalization and ongoing recovery are:: Family would like for patient to have home health at discharge CMS Medicare.gov Compare Post Acute Care list provided to:: Patient Represenative (must comment) Choice offered to / list presented to : Adult Children  Expected Discharge Plan and Services Expected Discharge Plan: Home w Home Health Services   Discharge Planning Services: CM Consult Post Acute Care Choice: Home Health Living arrangements for the past 2 months: Apartment                           HH Arranged: RN, PT, OT, Nurse's Aide HH Agency: [facility] Agency Contacted: 10/26/19 Time HH Agency Contacted: [phone] Representative spoke with at [facility] Agency: Christy Gentles  Prior Living  Arrangements/Services Living arrangements for the past 2 months: Apartment Lives with:: Adult Children Patient language and need for interpreter reviewed:: Yes Do you feel safe going back to the place where you live?: Yes      Need for Family Participation in Patient Care: Yes (Comment) (COVID) Care giver support system in place?: Yes (comment) (sons) Current home services: DME (Walker, arm bars, lift chair) Criminal Activity/Legal Involvement Pertinent to Current Situation/Hospitalization: No - Comment as needed  Activities of Daily Living      Permission Sought/Granted Permission sought to share information with : Case Manager, Family Supports, Other (comment) Permission granted to share information with : Yes, Verbal Permission Granted  Share Information with NAME: Tasia Catchings and Dewain Platz  Permission granted to share info w AGENCY: [facility] Health agency  Permission granted to share info w Relationship: sons     Emotional Assessment       Orientation: : Oriented to Self, Oriented to Place, Oriented to  Time, Oriented to Situation Alcohol / Substance Use: Not Applicable Psych Involvement: No (comment)  Admission diagnosis:  Metabolic encephalopathy [G93.41] Generalized weakness [R53.1] Acute respiratory failure with hypoxemia (HCC) [J96.01] Pneumonia due to COVID-19 virus [U07.1, J12.82] COVID-19 [U07.1] Patient Active Problem List   Diagnosis Date Noted   Pneumonia due to COVID-19 virus 10/25/2019   Urinary incontinence    Urinary frequency  Benign essential HTN    Right spastic hemiparesis (HCC)    Stage 3 chronic kidney disease    Left sided cerebral hemisphere cerebrovascular accident (CVA) (HCC) 10/01/2016   Right hemiparesis (HCC) 10/01/2016   Acute CVA (cerebrovascular accident) (HCC) 09/30/2016   Dyslipidemia 09/30/2016   Hypomagnesemia 09/30/2016   AKI (acute kidney injury) (HCC) 09/30/2016   Hypokalemia 09/30/2016   Fall at home,  initial encounter 09/30/2016   Ataxia 09/30/2016   Essential hypertension    UTI (urinary tract infection) 09/28/2016   PCP:  Shelda Jakes, PA-C Pharmacy:   [facility] [address], [place] - [address] [place] [address] Phone: [phone] Fax: [phone]  [id number] [place], [place] - [address] [place] [address] Phone: [phone] Fax: [phone]     Social Determinants of Health (SDOH) Interventions    Readmission Risk Interventions No flowsheet data found.

## 2019-10-26 NOTE — Progress Notes (Signed)
Patient scheduled for outpatient Remdesivir infusions at 11am on Thursday 9/16 and Friday 9/17 at [facility]. Please inform the patient to park at [address] [place], [place], as staff will be escorting the patient through the east entrance of the hospital.   Transportation has been arranged through [employer] and will picking up the patient between 10-10:15am.    There is a wave flag banner located near the entrance on [address]. Turn into this entrance and immediately turn left and park in 1 of the 5 designated Covid Infusion Parking spots. There is a phone number on the sign, please call and let the staff know what spot you are in and we will come out and get you. For questions call [phone].  Thanks.

## 2019-10-26 NOTE — Evaluation (Signed)
Physical Therapy Evaluation Patient Details Name: Robert Buckley MRN: [id number] DOB: [birth year] Today's Date: 10/26/2019   History of Present Illness  Robert Buckley is a 73yoM who comes to [facility] 9/12 c acute onset Left weakness and ataxic gait as witness by family.  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 Noted (+) Covid test, as well as desat with AMB.  Clinical Impression  Pt admitted with above diagnosis. Pt currently with functional limitations due to the deficits listed below (see "PT Problem List"). Upon entry, pt in chair, awake and agreeable to participate. Pt just finishing up with OT. The pt is alert and oriented to self, struggles with year, month, and current location, pleasant, conversational, and generally a poor historian. MinA to rise for physical assist and safety recommendations, AMB to 10ft prior to onset of fatigue, but able to make it back to room. Pt performs entire session on RA without any desaturation. Functional mobility assessment demonstrates increased effort/time requirements, poor tolerance, and need for physical assistance, whereas the patient performed these at a higher level of independence PTA. Pt reports his AMB to feel slower than usual, but he is unable to reliably say whether he is able to AMB greater distances at baseline. Pt will benefit from skilled PT intervention to increase independence and safety with basic mobility in preparation for discharge to the venue listed below.       Follow Up Recommendations Home health PT;Supervision/Assistance - 24 hour    Equipment Recommendations  None recommended by PT    Recommendations for Other Services       Precautions / Restrictions Precautions Precautions: Fall Restrictions Weight Bearing Restrictions: No      Mobility  Bed Mobility               General bed mobility comments: in chair at entry  Transfers Overall transfer level: Needs assistance Equipment used: Rolling walker (2  wheeled) Transfers: Sit to/from Stand Sit to Stand: Min assist         General transfer comment: generally weak, requires minA, problem solving strategies are unsafe.  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  Stairs            Wheelchair Mobility    Modified Rankin (Stroke Patients Only)       Balance Overall balance assessment: Modified Independent;Mild deficits observed, not formally tested;History of Falls                                           Pertinent Vitals/Pain Pain Assessment: No/denies pain    Home Living Family/patient expects to be discharged to:: Private residence Living Arrangements: Children Available Help at Discharge: Family Type of Home: Apartment Home Access: Level entry     Home Layout: One level Home Equipment: Shower seat;Grab bars - tub/shower;Walker - 4 wheels;Hand held shower head      Prior Function Level of Independence: Needs assistance   Gait / Transfers Assistance Needed: [phone] for household distance AMB           Hand Dominance        Extremity/Trunk Assessment   Upper Extremity Assessment  Upper Extremity Assessment: RUE deficits/detail RUE Deficits / Details: chronic spasticity and weakness    Lower Extremity Assessment Lower Extremity Assessment: RLE deficits/detail RLE Deficits / Details: chronic spasticity and weakness    Cervical / Trunk Assessment Cervical / Trunk Assessment: Normal  Communication      Cognition Arousal/Alertness: Awake/alert Behavior During Therapy: WFL for tasks assessed/performed Overall Cognitive Status: No family/caregiver present to determine baseline cognitive  functioning                                 General Comments: interactive, but difficulty answerign questions, and struggles with orientation questions      General Comments      Exercises     Assessment/Plan    PT Assessment Patient needs continued PT services  PT Problem List Decreased strength;Decreased range of motion;Decreased activity tolerance;Decreased balance;Decreased mobility;Cardiopulmonary status limiting activity       PT Treatment Interventions Gait training;Functional mobility training;Therapeutic activities;Therapeutic exercise;Patient/family education    PT Goals (Current goals can be found in the Care Plan section)  Acute Rehab PT Goals Patient Stated Goal: wants to regain strength PT Goal Formulation: With patient Time For Goal Achievement: 11/09/19 Potential to Achieve Goals: Fair    Frequency Min 2X/week   Barriers to discharge        Co-evaluation               AM-PAC PT "6 Clicks" Mobility  Outcome Measure Help needed turning from your back to your side while in a flat bed without using bedrails?: A Lot Help needed moving from lying on your back to sitting on the side of a flat bed without using bedrails?: A Lot Help needed moving to and from a bed to a chair (including a wheelchair)?: A Little Help needed standing up from a chair using your arms (e.g., wheelchair or bedside chair)?: A Little Help needed to walk in hospital room?: A Little Help needed climbing 3-5 steps with a railing? : A Little 6 Click Score: 16    End of Session   Activity Tolerance: Patient tolerated treatment well;Patient limited by fatigue Patient left: in chair;with call bell/phone within reach;with chair alarm set Nurse Communication: Mobility status PT Visit Diagnosis: Difficulty in walking, not elsewhere classified (R26.2);Muscle weakness (generalized) (M62.81);Repeated falls (R29.6)    Time: 8341-9622 PT Time Calculation (min) (ACUTE ONLY):  26 min   Charges:   PT Evaluation $PT Eval Moderate Complexity: 1 Mod PT Treatments $Therapeutic Exercise: 8-22 mins       12:54 PM, 10/26/19 Rosamaria Lints, PT, DPT Physical Therapist - [facility]  [phone] (ASCOM)    Sheina Mcleish C 10/26/2019, 12:43 PM

## 2019-10-27 LAB — COMPREHENSIVE METABOLIC PANEL
ALT: 31 U/L (ref 0–44)
AST: 26 U/L (ref 15–41)
Albumin: 3.5 g/dL (ref 3.5–5.0)
Alkaline Phosphatase: 85 U/L (ref 38–126)
Anion gap: 11 (ref 5–15)
BUN: 33 mg/dL — ABNORMAL HIGH (ref 8–23)
CO2: 22 mmol/L (ref 22–32)
Calcium: 8.6 mg/dL — ABNORMAL LOW (ref 8.9–10.3)
Chloride: 107 mmol/L (ref 98–111)
Creatinine, Ser: 1.62 mg/dL — ABNORMAL HIGH (ref 0.61–1.24)
GFR calc Af Amer: 48 mL/min — ABNORMAL LOW (ref >60)
GFR calc non Af Amer: 41 mL/min — ABNORMAL LOW (ref >60)
Glucose, Bld: 137 mg/dL — ABNORMAL HIGH (ref 70–99)
Potassium: 4.4 mmol/L (ref 3.5–5.1)
Sodium: 140 mmol/L (ref 135–145)
Total Bilirubin: 0.7 mg/dL (ref 0.3–1.2)
Total Protein: 6.9 g/dL (ref 6.5–8.1)

## 2019-10-27 LAB — CBC WITH DIFFERENTIAL/PLATELET
Abs Immature Granulocytes: 0.06 10*3/uL (ref 0.00–0.07)
Basophils Absolute: 0 10*3/uL (ref 0.0–0.1)
Basophils Relative: 0 %
Eosinophils Absolute: 0 10*3/uL (ref 0.0–0.5)
Eosinophils Relative: 0 %
HCT: 44.6 % (ref 39.0–52.0)
Hemoglobin: 16.2 g/dL (ref 13.0–17.0)
Immature Granulocytes: 1 %
Lymphocytes Relative: 8 %
Lymphs Abs: 0.9 10*3/uL (ref 0.7–4.0)
MCH: 31.6 pg (ref 26.0–34.0)
MCHC: 36.3 g/dL — ABNORMAL HIGH (ref 30.0–36.0)
MCV: 86.9 fL (ref 80.0–100.0)
Monocytes Absolute: 0.5 10*3/uL (ref 0.1–1.0)
Monocytes Relative: 4 %
Neutro Abs: 10.2 10*3/uL — ABNORMAL HIGH (ref 1.7–7.7)
Neutrophils Relative %: 87 %
Platelets: 174 10*3/uL (ref 150–400)
RBC: 5.13 MIL/uL (ref 4.22–5.81)
RDW: 13.8 % (ref 11.5–15.5)
WBC: 11.6 10*3/uL — ABNORMAL HIGH (ref 4.0–10.5)
nRBC: 0 % (ref 0.0–0.2)

## 2019-10-27 LAB — C-REACTIVE PROTEIN: CRP: 0.6 mg/dL (ref <1.0)

## 2019-10-27 LAB — FIBRIN DERIVATIVES D-DIMER (ARMC ONLY): Fibrin derivatives D-dimer (ARMC): 546.27 ng/mL (FEU) — ABNORMAL HIGH (ref 0.00–499.00)

## 2019-10-27 LAB — FERRITIN: Ferritin: 336 ng/mL (ref 24–336)

## 2019-10-27 MED ORDER — PREDNISONE 50 MG PO TABS: 50.0000 mg | ORAL_TABLET | Freq: Every day | ORAL | 0 refills | Status: DC

## 2019-10-27 MED ORDER — BENZONATATE 100 MG PO CAPS: 100.0000 mg | ORAL_CAPSULE | Freq: Four times a day (QID) | ORAL | 0 refills | Status: DC | PRN

## 2019-10-27 MED ORDER — ZINC SULFATE 220 (50 ZN) MG PO CAPS: 220.0000 mg | ORAL_CAPSULE | Freq: Every day | ORAL | 0 refills | Status: AC

## 2019-10-27 MED ORDER — ASCORBIC ACID 500 MG PO TABS: 500.0000 mg | ORAL_TABLET | Freq: Every day | ORAL | 0 refills | Status: AC

## 2019-10-27 MED ORDER — ZINC SULFATE 220 (50 ZN) MG PO CAPS: 220.0000 mg | ORAL_CAPSULE | Freq: Every day | ORAL | 0 refills | Status: DC

## 2019-10-27 MED ORDER — ALBUTEROL SULFATE HFA 108 (90 BASE) MCG/ACT IN AERS: 2.0000 | INHALATION_SPRAY | Freq: Four times a day (QID) | RESPIRATORY_TRACT | 2 refills | Status: DC | PRN

## 2019-10-27 MED ORDER — BENZONATATE 100 MG PO CAPS: 100.0000 mg | ORAL_CAPSULE | Freq: Four times a day (QID) | ORAL | 0 refills | Status: AC | PRN

## 2019-10-27 MED ORDER — ASCORBIC ACID 500 MG PO TABS: 500.0000 mg | ORAL_TABLET | Freq: Every day | ORAL | 0 refills | Status: DC

## 2019-10-27 MED ORDER — ALBUTEROL SULFATE HFA 108 (90 BASE) MCG/ACT IN AERS: 2.0000 | INHALATION_SPRAY | Freq: Four times a day (QID) | RESPIRATORY_TRACT | 2 refills | Status: AC | PRN

## 2019-10-27 MED ORDER — VITAMIN D3 25 MCG PO TABS: 1000.0000 [IU] | ORAL_TABLET | Freq: Every day | ORAL | 0 refills | Status: DC

## 2019-10-27 MED ORDER — VITAMIN D3 25 MCG PO TABS: 1000.0000 [IU] | ORAL_TABLET | Freq: Every day | ORAL | 0 refills | Status: AC

## 2019-10-27 NOTE — TOC Transition Note (Signed)
Transition of Care [facility]) - CM/SW Discharge Note   Patient Details  Name: Willmar Stockinger MRN: [id number] Date of Birth: [birth year]  [facility] CM/SW Contact:  Allayne Butcher, RN Phone Number: 10/27/2019, 11:49 AM   Clinical Narrative:    Patient is medically cleared for discharge home with home health services.  [place] [facility] has accepted referral for home health RN, PT, OT, and aide, [place] aware of discharge home today.  Patient's son will come and pick him up today.    Final next level of care: Home w Home Health Services Barriers to Discharge: Barriers Resolved   Patient Goals and CMS Choice Patient states their goals for this hospitalization and ongoing recovery are:: Family would like for patient to have home health at discharge CMS Medicare.gov Compare Post Acute Care list provided to:: Patient Represenative (must comment) Choice offered to / list presented to : Adult Children  Discharge Placement                       Discharge Plan and Services   Discharge Planning Services: CM Consult Post Acute Care Choice: Home Health                    HH Arranged: RN, PT, OT, Nurse's Aide [facility] Agency: [facility] Date [facility] Agency Contacted: 10/27/19 Time HH Agency Contacted: [phone] Representative spoke with at [facility] Agency: Christy Gentles  Social Determinants of Health (SDOH) Interventions     Readmission Risk Interventions No flowsheet data found.

## 2019-10-27 NOTE — Progress Notes (Signed)
On assessment patient was resting well with no pain or distress. After arousing patient, he requested to get up to chair. Got patient up to bedside and moved him to chair with assistance. His o2 sats were maintaining in the 95-100% RA. Will notify son when patient is ready to leave. He has call bell in reach and bed in low position. Infusing dose of remdesivir now.

## 2019-10-27 NOTE — Discharge Summary (Signed)
Physician Discharge Summary  Robert Buckley WUJ:[id number] DOB: 1945-12-30 DOA: 10/24/2019  PCP: Shelda Jakes, PA-C  Admit date: 10/24/2019 Discharge date: 10/27/2019  Admitted From: Home Disposition: Follow  Recommendations for Outpatient Follow-up:  1. Follow up with PCP in 1-2 weeks 2.  Present to [facility] hospital on 10/28/2019 and 10/29/2019 for outpatient remdesivir infusion  Home Health: Yes Equipment/Devices: None Discharge Condition: Stable CODE STATUS: Full Diet recommendation: Heart Healthy / Carb Modified Brief/Interim Summary: 74 year old African-American male with history of hypertension, CVA with residual right-sided weakness, stage III a chronic kidney disease who presents for evaluation of increasing weakness. Found to be Covid positive. Patient is unvaccinated. Presentation relatively mild at this time.. Patient requiring 2 L supplemental oxygen. Continue current standard of care. Remdesivir, steroids, supplemental oxygen. Can DC baricitinib. Wean oxygen as tolerated. Stress proning. Incentive spirometry use.  9/14: Patient seen and examined.  Weaned from supplemental oxygen last night.  Still appears rather weak and deconditioned but may be close to baseline.  9/15: Patient seen and examined.  Slept well overnight.  No oxygen requirement.  No fevers over interval.  Stable for discharge home.  Outpatient remdesivir infusion arranged.  Patient will go to [facility] hospital on 10/28/2019 and 10/29/2019.  Transport is been arranged as well.  Discharge Diagnoses:  Active Problems:   Pneumonia due to COVID-19 virus  Pneumonia secondary to COVID-19 Acute hypoxemic respiratory failure secondary to above Patient had evidence of a multifocal pneumonia on x-ray Has been weaned from supplemental oxygen but still appears weak and deconditioned -Improved on day of discharge -Received 3/5 doses of remdesivir in house.  Remaining 2 doses to be administered as  outpatient.  This is been arranged.  Transport is also been set up -Continue 10 days of steroids -No oxygen need -Discharge patient with incentive spirometer -Albuterol MDI and Tessalon Perles as needed prescribed on discharge -30-day supplies supplemental vitamins prescribed as well -Stable for discharge home -Home PT ordered  Hypokalemia Improved at time of discharge  Essential hypertension Continue Norvasc Continue metoprolol Continue Avapro  Stage IIIa chronic kidney disease No IV fluids Creatinine at baseline  Hyperlipidemia Hold statin while on remdesivir Can resume on discharge  BPH Overactive bladder Continue Flomax and oxybutynin  History of CVA On Plavix   Discharge Instructions  Discharge Instructions    Diet - low sodium heart healthy   Complete by: As directed    Increase activity slowly   Complete by: As directed      Allergies as of 10/27/2019      Reactions   Sulfa Antibiotics Rash      Medication List    TAKE these medications   albuterol 108 (90 Base) MCG/ACT inhaler Commonly known as: VENTOLIN HFA Inhale 2 puffs into the lungs every 6 (six) hours as needed for wheezing or shortness of breath.   amLODipine 10 MG tablet Commonly known as: NORVASC Take 10 mg by mouth daily.   ascorbic acid 500 MG tablet Commonly known as: VITAMIN C Take 1 tablet (500 mg total) by mouth daily.   atorvastatin 20 MG tablet Commonly known as: LIPITOR Take 20 mg by mouth daily.   benzonatate 100 MG capsule Commonly known as: Tessalon Perles Take 1 capsule (100 mg total) by mouth every 6 (six) hours as needed for cough.   clopidogrel 75 MG tablet Commonly known as: PLAVIX Take 1 tablet (75 mg total) by mouth daily.   darifenacin 7.5 MG 24 hr tablet Commonly known as: ENABLEX Take 1  tablet (7.5 mg total) by mouth daily.   finasteride 5 MG tablet Commonly known as: PROSCAR Take 1 tablet (5 mg total) by mouth daily.   metoprolol succinate 50  MG 24 hr tablet Commonly known as: TOPROL-XL Take 1 tablet (50 mg total) by mouth daily. Take with or immediately following a meal.   predniSONE 50 MG tablet Commonly known as: DELTASONE Take 1 tablet (50 mg total) by mouth daily with breakfast for 7 days. Start taking on: October 28, 2019   traMADol 50 MG tablet Commonly known as: Ultram Take 1 tablet (50 mg total) by mouth every 8 (eight) hours as needed.   Vitamin D3 25 MCG tablet Commonly known as: Vitamin D Take 1 tablet (1,000 Units total) by mouth daily.   zinc sulfate 220 (50 Zn) MG capsule Take 1 capsule (220 mg total) by mouth daily.       Follow-up Information    Shelda Jakes, PA-C. Schedule an appointment as soon as possible for a visit in 1 week.   Why: Office request son Tasia Catchings to call and schedule appoinment at his convience to assist his father during the appoinment.  Contact information: [address] [place], [place] [address] [phone]             Allergies  Allergen Reactions  . Sulfa Antibiotics Rash    Consultations:  None   Procedures/Studies: DG Chest 1 View  Result Date: 10/24/2019 CLINICAL DATA:  Fever and weakness EXAM: CHEST  1 VIEW COMPARISON:  None. FINDINGS: Left lung is grossly clear. Streaky right upper lobe and right infrahilar opacities. Normal heart size. No pneumothorax. IMPRESSION: Streaky opacities in the right upper lobe and right infrahilar region, age indeterminate, but raise possibility of pneumonia. Electronically Signed   By: Jasmine Pang M.D.   On: 10/24/2019 23:07   CT HEAD CODE STROKE WO CONTRAST  Result Date: 10/24/2019 CLINICAL DATA:  Code stroke. Initial evaluation for acute left-sided weakness. EXAM: CT HEAD WITHOUT CONTRAST TECHNIQUE: Contiguous axial images were obtained from the base of the skull through the vertex without intravenous contrast. COMPARISON:  Prior MRI from 09/29/2016. FINDINGS: Brain: Age-related cerebral atrophy with advanced chronic  microvascular ischemic disease. Scattered remote lacunar infarcts noted about the bilateral basal ganglia. No acute intracranial hemorrhage. No acute large vessel territory infarct. No mass lesion, midline shift or mass effect. Diffuse ventricular prominence related global parenchymal volume loss without hydrocephalus. No extra-axial fluid collection. Vascular: No hyperdense vessel. Scattered vascular calcifications about the carotid siphons. Skull: Scalp soft tissues within normal limits.  Calvarium intact. Sinuses/Orbits: Globes and orbital soft tissues within normal limits. Visualized paranasal sinuses are largely clear. No mastoid effusion. Other: None. ASPECTS [facility] Stroke Program Early CT Score) - Ganglionic level infarction (caudate, lentiform nuclei, internal capsule, insula, M1-M3 cortex): 7 - Supraganglionic infarction (M4-M6 cortex): 3 Total score (0-10 with 10 being normal): 10 IMPRESSION: 1. No acute intracranial infarct or other abnormality. 2. ASPECTS is 10. 3. Moderately advanced age-related cerebral atrophy with chronic small vessel ischemic disease. Critical Value/emergent results were called by telephone at the time of interpretation on 10/24/2019 at 10:32 pm to provider Jene Every , who verbally acknowledged these results. Electronically Signed   By: Rise Mu M.D.   On: 10/24/2019 22:32    (Echo, Carotid, EGD, Colonoscopy, ERCP)    Subjective: Seen and examined the day of discharge.  No distress.  Room air.  No fevers.  Stable for discharge home.  Discharge Exam: Vitals:   10/27/19 0503  10/27/19 0815  BP:  (!) 166/71  Pulse: (!) 48 (!) 49  Resp: 18 15  Temp: 98.1 F (36.7 C) 98.6 F (37 C)  SpO2: 98% 97%   Vitals:   10/26/19 1930 10/26/19 2351 10/27/19 0503 10/27/19 0815  BP:    (!) 166/71  Pulse: (!) 53 (!) 45 (!) 48 (!) 49  Resp: 18 20 18 15   Temp: 97.7 F (36.5 C)  98.1 F (36.7 C) 98.6 F (37 C)  TempSrc: Oral   Oral  SpO2: 98% 96% 98% 97%   Weight:        General: Pt is alert, awake, not in acute distress Cardiovascular: RRR, S1/S2 +, no rubs, no gallops Respiratory: CTA bilaterally, no wheezing, no rhonchi Abdominal: Soft, NT, ND, bowel sounds + Extremities: no edema, no cyanosis    The results of significant diagnostics from this hospitalization (including imaging, microbiology, ancillary and laboratory) are listed below for reference.     Microbiology: Recent Results (from the past 240 hour(s))  SARS Coronavirus 2 by RT PCR (hospital order, performed in [facility] lab) Nasopharyngeal Nasopharyngeal Swab     Status: Abnormal   Collection Time: 10/24/19 10:01 PM   Specimen: Nasopharyngeal Swab  Result Value Ref Range Status   SARS Coronavirus 2 POSITIVE (A) NEGATIVE Final    Comment: RESULT CALLED TO, READ BACK BY AND VERIFIED WITH: RENEE BEAUREGARD 10/25/19 AT 0006 HS (NOTE) SARS-CoV-2 target nucleic acids are DETECTED  SARS-CoV-2 RNA is generally detectable in upper respiratory specimens  during the acute phase of infection.  Positive results are indicative  of the presence of the identified virus, but do not rule out bacterial infection or co-infection with other pathogens not detected by the test.  Clinical correlation with patient history and  other diagnostic information is necessary to determine patient infection status.  The expected result is negative.  Fact Sheet for Patients:   BoilerBrush.com.cyhttps://www.fda.gov/media/136312/download   Fact Sheet for Healthcare Providers:   https://pope.com/https://www.fda.gov/media/136313/download    This test is not yet approved or cleared by the [place] FDA and  has been authorized for detection and/or diagnosis of SARS-CoV-2 by FDA under an Emergency Use Authorization (EUA).  This EUA will remain in effect (meaning this t est can be used) for the duration of  the COVID-19 declaration under Section 564(b)(1) of the Act, 21 U.S.C. section 360-bbb-3(b)(1), unless the  authorization is terminated or revoked sooner.  Performed at [facility], [address] [place], [place] [address]   Blood Culture (routine x 2)     Status: None (Preliminary result)   Collection Time: 10/24/19 10:36 PM   Specimen: BLOOD  Result Value Ref Range Status   Specimen Description BLOOD LEFT HAND  Final   Special Requests   Final    BOTTLES DRAWN AEROBIC AND ANAEROBIC Blood Culture results may not be optimal due to an inadequate volume of blood received in culture bottles   Culture  Setup Time   Final    AEROBIC BOTTLE ONLY Organism ID to follow GRAM POSITIVE COCCI CRITICAL RESULT CALLED TO, READ BACK BY AND VERIFIED WITH: Rudene ChristiansMORGAN CUNNINGHAM 10/25/19 AT 1722 HS Performed at [facility], [address] [place], [place] [address]    Culture [facility]  Final   Report Status PENDING  Incomplete  Blood Culture (routine x 2)     Status: None (Preliminary result)   Collection Time: 10/24/19 10:36 PM   Specimen: BLOOD  Result Value Ref Range Status  Specimen Description BLOOD LEFT HAND  Final   Special Requests   Final    BOTTLES DRAWN AEROBIC AND ANAEROBIC Blood Culture results may not be optimal due to an inadequate volume of blood received in culture bottles   Culture   Final    NO GROWTH 3 DAYS Performed at [facility], [address] [place], [place] [address]    Report Status PENDING  Incomplete  Blood Culture ID Panel (Reflexed)     Status: None   Collection Time: 10/24/19 10:36 PM  Result Value Ref Range Status   Enterococcus faecalis NOT DETECTED NOT DETECTED Final   Enterococcus Faecium NOT DETECTED NOT DETECTED Final   Listeria monocytogenes NOT DETECTED NOT DETECTED Final   Staphylococcus species NOT DETECTED NOT DETECTED Final   Staphylococcus aureus (BCID) NOT DETECTED NOT DETECTED Final   Staphylococcus epidermidis NOT DETECTED NOT DETECTED Final   Staphylococcus lugdunensis NOT DETECTED NOT DETECTED Final    Streptococcus species NOT DETECTED NOT DETECTED Final   Streptococcus agalactiae NOT DETECTED NOT DETECTED Final   Streptococcus pneumoniae NOT DETECTED NOT DETECTED Final   Streptococcus pyogenes NOT DETECTED NOT DETECTED Final   A.calcoaceticus-baumannii NOT DETECTED NOT DETECTED Final   Bacteroides fragilis NOT DETECTED NOT DETECTED Final   Enterobacterales NOT DETECTED NOT DETECTED Final   Enterobacter cloacae complex NOT DETECTED NOT DETECTED Final   Escherichia coli NOT DETECTED NOT DETECTED Final   Klebsiella aerogenes NOT DETECTED NOT DETECTED Final   Klebsiella oxytoca NOT DETECTED NOT DETECTED Final   Klebsiella pneumoniae NOT DETECTED NOT DETECTED Final   Proteus species NOT DETECTED NOT DETECTED Final   Salmonella species NOT DETECTED NOT DETECTED Final   Serratia marcescens NOT DETECTED NOT DETECTED Final   Haemophilus influenzae NOT DETECTED NOT DETECTED Final   Neisseria meningitidis NOT DETECTED NOT DETECTED Final   Pseudomonas aeruginosa NOT DETECTED NOT DETECTED Final   Stenotrophomonas maltophilia NOT DETECTED NOT DETECTED Final   Candida albicans NOT DETECTED NOT DETECTED Final   Candida auris NOT DETECTED NOT DETECTED Final   Candida glabrata NOT DETECTED NOT DETECTED Final   Candida krusei NOT DETECTED NOT DETECTED Final   Candida parapsilosis NOT DETECTED NOT DETECTED Final   Candida tropicalis NOT DETECTED NOT DETECTED Final   Cryptococcus neoformans/gattii NOT DETECTED NOT DETECTED Final    Comment: Performed at [facility], [address] [place], [place] [address]  Urine culture     Status: Abnormal   Collection Time: 10/25/19  1:34 AM   Specimen: In/Out Cath Urine  Result Value Ref Range Status   Specimen Description   Final    IN/OUT CATH URINE Performed at [facility], [address] [place], [place] [address]    Special Requests   Final    NONE Performed at [facility], [address] [place], [place] [address]     Culture MULTIPLE SPECIES PRESENT, SUGGEST RECOLLECTION (A)  Final   Report Status 10/25/2019 FINAL  Final     Labs: BNP (last 3 results) Recent Labs    10/25/19 0200  BNP 116.1*   Basic Metabolic Panel: Recent Labs  Lab 10/24/19 2201 10/26/19 0337 10/27/19 0406  NA 146* 142 140  K 3.4* 4.2 4.4  CL 115* 111 107  CO2 19* 22 22  GLUCOSE 101* 101* 137*  BUN 14 24* 33*  CREATININE 1.58* 1.66* 1.62*  CALCIUM 8.1* 8.5* 8.6*  MG  --  1.9  --    Liver Function Tests: Recent  Labs  Lab 10/24/19 2201 10/26/19 0337 10/27/19 0406  AST 28 32 26  ALT 28 30 31   ALKPHOS 97 88 85  BILITOT 1.0 0.5 0.7  PROT 7.1 6.6 6.9  ALBUMIN 3.7 3.3* 3.5   No results for input(s): LIPASE, AMYLASE in the last 168 hours. No results for input(s): AMMONIA in the last 168 hours. CBC: Recent Labs  Lab 10/24/19 2201 10/26/19 0337 10/27/19 0406  WBC 5.9 7.0 11.6*  NEUTROABS 4.6 5.4 10.2*  HGB 15.5 14.5 16.2  HCT 43.8 41.1 44.6  MCV 89.4 88.8 86.9  PLT 152 180 174   Cardiac Enzymes: No results for input(s): CKTOTAL, CKMB, CKMBINDEX, TROPONINI in the last 168 hours. BNP: Invalid input(s): POCBNP CBG: Recent Labs  Lab 10/24/19 2135  GLUCAP 114*   D-Dimer No results for input(s): DDIMER in the last 72 hours. Hgb A1c No results for input(s): HGBA1C in the last 72 hours. Lipid Profile No results for input(s): CHOL, HDL, LDLCALC, TRIG, CHOLHDL, LDLDIRECT in the last 72 hours. Thyroid function studies No results for input(s): TSH, T4TOTAL, T3FREE, THYROIDAB in the last 72 hours.  Invalid input(s): FREET3 Anemia work up Recent Labs    10/26/19 0337 10/27/19 0406  FERRITIN 238 336   Urinalysis    Component Value Date/Time   COLORURINE YELLOW (A) 10/25/2019 0134   APPEARANCEUR CLEAR (A) 10/25/2019 0134   LABSPEC 1.013 10/25/2019 0134   PHURINE 5.0 10/25/2019 0134   GLUCOSEU NEGATIVE 10/25/2019 0134   HGBUR SMALL (A) 10/25/2019 0134   BILIRUBINUR NEGATIVE 10/25/2019 0134    KETONESUR NEGATIVE 10/25/2019 0134   PROTEINUR >=300 (A) 10/25/2019 0134   NITRITE NEGATIVE 10/25/2019 0134   LEUKOCYTESUR NEGATIVE 10/25/2019 0134   Sepsis Labs Invalid input(s): PROCALCITONIN,  WBC,  LACTICIDVEN Microbiology Recent Results (from the past 240 hour(s))  SARS Coronavirus 2 by RT PCR (hospital order, performed in [facility] hospital lab) Nasopharyngeal Nasopharyngeal Swab     Status: Abnormal   Collection Time: 10/24/19 10:01 PM   Specimen: Nasopharyngeal Swab  Result Value Ref Range Status   SARS Coronavirus 2 POSITIVE (A) NEGATIVE Final    Comment: RESULT CALLED TO, READ BACK BY AND VERIFIED WITH: RENEE BEAUREGARD 10/25/19 AT 0006 HS (NOTE) SARS-CoV-2 target nucleic acids are DETECTED  SARS-CoV-2 RNA is generally detectable in upper respiratory specimens  during the acute phase of infection.  Positive results are indicative  of the presence of the identified virus, but do not rule out bacterial infection or co-infection with other pathogens not detected by the test.  Clinical correlation with patient history and  other diagnostic information is necessary to determine patient infection status.  The expected result is negative.  Fact Sheet for Patients:   10/27/19   Fact Sheet for Healthcare Providers:   BoilerBrush.com.cy    This test is not yet approved or cleared by the https://pope.com/ FDA and  has been authorized for detection and/or diagnosis of SARS-CoV-2 by FDA under an Emergency Use Authorization (EUA).  This EUA will remain in effect (meaning this t est can be used) for the duration of  the COVID-19 declaration under Section 564(b)(1) of the Act, 21 U.S.C. section 360-bbb-3(b)(1), unless the authorization is terminated or revoked sooner.  Performed at [facility] [address] [place], [place]   Blood Culture (routine x 2)     Status: None (Preliminary result)    Collection Time: 10/24/19 10:36 PM   Specimen: BLOOD  Result Value Ref Range Status  Specimen Description BLOOD LEFT HAND  Final   Special Requests   Final    BOTTLES DRAWN AEROBIC AND ANAEROBIC Blood Culture results may not be optimal due to an inadequate volume of blood received in culture bottles   Culture  Setup Time   Final    AEROBIC BOTTLE ONLY Organism ID to follow GRAM POSITIVE COCCI CRITICAL RESULT CALLED TO, READ BACK BY AND VERIFIED WITH: MORGAN CUNNINGHAM 10/25/19 AT 1722 HS Performed at [facility], [address] [place], [place] [address]    Culture GRAM POSITIVE COCCI  Final   Report Status PENDING  Incomplete  Blood Culture (routine x 2)     Status: None (Preliminary result)   Collection Time: 10/24/19 10:36 PM   Specimen: BLOOD  Result Value Ref Range Status   Specimen Description BLOOD LEFT HAND  Final   Special Requests   Final    BOTTLES DRAWN AEROBIC AND ANAEROBIC Blood Culture results may not be optimal due to an inadequate volume of blood received in culture bottles   Culture   Final    NO GROWTH 3 DAYS Performed at [facility], [address] [place], [place] [address]    Report Status PENDING  Incomplete  Blood Culture ID Panel (Reflexed)     Status: None   Collection Time: 10/24/19 10:36 PM  Result Value Ref Range Status   Enterococcus faecalis NOT DETECTED NOT DETECTED Final   Enterococcus Faecium NOT DETECTED NOT DETECTED Final   Listeria monocytogenes NOT DETECTED NOT DETECTED Final   Staphylococcus species NOT DETECTED NOT DETECTED Final   Staphylococcus aureus (BCID) NOT DETECTED NOT DETECTED Final   Staphylococcus epidermidis NOT DETECTED NOT DETECTED Final   Staphylococcus lugdunensis NOT DETECTED NOT DETECTED Final   Streptococcus species NOT DETECTED NOT DETECTED Final   Streptococcus agalactiae NOT DETECTED NOT DETECTED Final   Streptococcus pneumoniae NOT DETECTED NOT DETECTED Final   Streptococcus pyogenes NOT  DETECTED NOT DETECTED Final   A.calcoaceticus-baumannii NOT DETECTED NOT DETECTED Final   Bacteroides fragilis NOT DETECTED NOT DETECTED Final   Enterobacterales NOT DETECTED NOT DETECTED Final   Enterobacter cloacae complex NOT DETECTED NOT DETECTED Final   Escherichia coli NOT DETECTED NOT DETECTED Final   Klebsiella aerogenes NOT DETECTED NOT DETECTED Final   Klebsiella oxytoca NOT DETECTED NOT DETECTED Final   Klebsiella pneumoniae NOT DETECTED NOT DETECTED Final   Proteus species NOT DETECTED NOT DETECTED Final   Salmonella species NOT DETECTED NOT DETECTED Final   Serratia marcescens NOT DETECTED NOT DETECTED Final   Haemophilus influenzae NOT DETECTED NOT DETECTED Final   Neisseria meningitidis NOT DETECTED NOT DETECTED Final   Pseudomonas aeruginosa NOT DETECTED NOT DETECTED Final   Stenotrophomonas maltophilia NOT DETECTED NOT DETECTED Final   Candida albicans NOT DETECTED NOT DETECTED Final   Candida auris NOT DETECTED NOT DETECTED Final   Candida glabrata NOT DETECTED NOT DETECTED Final   Candida krusei NOT DETECTED NOT DETECTED Final   Candida parapsilosis NOT DETECTED NOT DETECTED Final   Candida tropicalis NOT DETECTED NOT DETECTED Final   Cryptococcus neoformans/gattii NOT DETECTED NOT DETECTED Final    Comment: Performed at [facility], [address] [place], [place] [address]  Urine culture     Status: Abnormal   Collection Time: 10/25/19  1:34 AM   Specimen: In/Out Cath Urine  Result Value Ref Range Status   Specimen Description   Final    IN/OUT CATH URINE Performed at [facility], [address], [place], [place] [address]    Special Requests   Final    NONE Performed at [facility], [address] [place], [place] [address]    Culture MULTIPLE SPECIES PRESENT, SUGGEST RECOLLECTION (A)  Final   Report Status 10/25/2019 FINAL  Final     Time coordinating discharge: Over 30 minutes  SIGNED:   Tresa Moore,  MD  [facility] 10/27/2019, 11:22 AM Pager   If 7PM-7AM, please contact night-coverage

## 2019-10-27 NOTE — Discharge Instructions (Signed)
Patient scheduled for outpatient Remdesivir infusions at 11am on Thursday 9/16 and Friday 9/17 at [facility]. Please inform the patient to park at [address], [place], as staff will be escorting the patient through the east entrance of the hospital.   Transportation has been arranged through [employer] and will picking up the patient between 10-10:15am.    There is a wave flag banner located near the entrance on [address] Turn into this entrance and immediately turn left and park in 1 of the 5 designated Covid Infusion Parking spots. There is a phone number on the sign, please call and let the staff know what spot you are in and we will come out and get you. For questions call [phone].  Thanks.   10 Things You Can Do to Manage Your COVID-19 Symptoms at Home If you have possible or confirmed COVID-19: 1. Stay home from work and school. And stay away from other public places. If you must go out, avoid using any kind of public transportation, ridesharing, or taxis. 2. Monitor your symptoms carefully. If your symptoms get worse, call your healthcare provider immediately. 3. Get rest and stay hydrated. 4. If you have a medical appointment, call the healthcare provider ahead of time and tell them that you have or may have COVID-19. 5. For medical emergencies, call 911 and notify the dispatch personnel that you have or may have COVID-19. 6. Cover your cough and sneezes with a tissue or use the inside of your elbow. 7. Wash your hands often with soap and water for at least 20 seconds or clean your hands with an alcohol-based hand sanitizer that contains at least 60% alcohol. 8. As much as possible, stay in a specific room and away from other people in your home. Also, you should use a separate bathroom, if available. If you need to be around other people in or outside of the home, wear a mask. 9. Avoid sharing personal items with other people in your household, like dishes,  towels, and bedding. 10. Clean all surfaces that are touched often, like counters, tabletops, and doorknobs. Use household cleaning sprays or wipes according to the label instructions. SouthAmericaFlowers.co.ukcdc.gov/coronavirus 08/12/2018 This information is not intended to replace advice given to you by your health care provider. Make sure you discuss any questions you have with your health care provider. Document Revised: 01/14/2019 Document Reviewed: 01/14/2019 Elsevier Patient Education  2020 [employer]   COVID-19 Frequently Asked Questions COVID-19 (coronavirus disease) is an infection that is caused by a large family of viruses. Some viruses cause illness in people and others cause illness in animals like camels, cats, and bats. In some cases, the viruses that cause illness in animals can spread to humans. Where did the coronavirus come from? In December 2019, Armeniahina told the [employer] [facility](WHO) of several cases of lung disease (human respiratory illness). These cases were linked to an open seafood and livestock market in the city of [place]. The link to the seafood and livestock market suggests that the virus may have spread from animals to humans. However, since that first outbreak in December, the virus has also been shown to spread from person to person. What is the name of the disease and the virus? Disease name Early on, this disease was called novel coronavirus. This is because scientists determined that the disease was caused by a new (novel) respiratory virus. The World Health Organization [facility](WHO) has now named the disease COVID-19, or coronavirus disease.  Virus name The virus that causes the disease is called severe acute respiratory syndrome coronavirus 2 (SARS-CoV-2). More information on disease and virus naming World Health Organization [facility]):  www.who.int/emergencies/diseases/novel-coronavirus-2019/technical-guidance/naming-the-coronavirus-disease-(covid-2019)-and-the-virus-that-causes-it Who is at risk for complications from coronavirus disease? Some people may be at higher risk for complications from coronavirus disease. This includes older adults and people who have chronic diseases, such as heart disease, diabetes, and lung disease. If you are at higher risk for complications, take these extra precautions:  Stay home as much as possible.  Avoid social gatherings and travel.  Avoid close contact with others. Stay at least 6 ft (2 m) away from others, if possible.  Wash your hands often with soap and water for at least 20 seconds.  Avoid touching your face, mouth, nose, or eyes.  Keep supplies on hand at home, such as food, medicine, and cleaning supplies.  If you must go out in public, wear a cloth face covering or face mask. Make sure your mask covers your nose and mouth. How does coronavirus disease spread? The virus that causes coronavirus disease spreads easily from person to person (is contagious). You may catch the virus by:  Breathing in droplets from an infected person. Droplets can be spread by a person breathing, speaking, singing, coughing, or sneezing.  Touching something, like a table or a doorknob, that was exposed to the virus (contaminated) and then touching your mouth, nose, or eyes. Can I get the virus from touching surfaces or objects? There is still a lot that we do not know about the virus that causes coronavirus disease. Scientists are basing a lot of information on what they know about similar viruses, such as:  Viruses cannot generally survive on surfaces for long. They need a human body (host) to survive.  It is more likely that the virus is spread by close contact with people who are sick (direct contact), such as through: ? Shaking hands or hugging. ? Breathing in respiratory droplets that  travel through the air. Droplets can be spread by a person breathing, speaking, singing, coughing, or sneezing.  It is less likely that the virus is spread when a person touches a surface or object that has the virus on it (indirect contact). The virus may be able to enter the body if the person touches a surface or object and then touches his or her face, eyes, nose, or mouth. Can a person spread the virus without having symptoms of the disease? It may be possible for the virus to spread before a person has symptoms of the disease, but this is most likely not the main way the virus is spreading. It is more likely for the virus to spread by being in close contact with people who are sick and breathing in the respiratory droplets spread by a person breathing, speaking, singing, coughing, or sneezing. What are the symptoms of coronavirus disease? Symptoms vary from person to person and can range from mild to severe. Symptoms may include:  Fever or chills.  Cough.  Difficulty breathing or feeling short of breath.  Headaches, body aches, or muscle aches.  Runny or stuffy (congested) nose.  Sore throat.  New loss of taste or smell.  Nausea, vomiting, or diarrhea. These symptoms can appear anywhere from 2 to 14 days after you have been exposed to the virus. Some people may not have any symptoms. If you develop symptoms, call your health care provider. People with severe symptoms may need hospital care. Should I be tested for this  virus? Your health care provider will decide whether to test you based on your symptoms, history of exposure, and your risk factors. How does a health care provider test for this virus? Health care providers will collect samples to send for testing. Samples may include:  Taking a swab of fluid from the back of your nose and throat, your nose, or your throat.  Taking fluid from the lungs by having you cough up mucus (sputum) into a sterile cup.  Taking a blood  sample. Is there a treatment or vaccine for this virus? Currently, there is no vaccine to prevent coronavirus disease. Also, there are no medicines like antibiotics or antivirals to treat the virus. A person who becomes sick is given supportive care, which means rest and fluids. A person may also relieve his or her symptoms by using over-the-counter medicines that treat sneezing, coughing, and runny nose. These are the same medicines that a person takes for the common cold. If you develop symptoms, call your health care provider. People with severe symptoms may need hospital care. What can I do to protect myself and my family from this virus?     You can protect yourself and your family by taking the same actions that you would take to prevent the spread of other viruses. Take the following actions:  Wash your hands often with soap and water for at least 20 seconds. If soap and water are not available, use alcohol-based hand sanitizer.  Avoid touching your face, mouth, nose, or eyes.  Cough or sneeze into a tissue, sleeve, or elbow. Do not cough or sneeze into your hand or the air. ? If you cough or sneeze into a tissue, throw it away immediately and wash your hands.  Disinfect objects and surfaces that you frequently touch every day.  Stay away from people who are sick.  Avoid going out in public, follow guidance from your state and local health authorities.  Avoid crowded indoor spaces. Stay at least 6 ft (2 m) away from others.  If you must go out in public, wear a cloth face covering or face mask. Make sure your mask covers your nose and mouth.  Stay home if you are sick, except to get medical care. Call your health care provider before you get medical care. Your health care provider will tell you how long to stay home.  Make sure your vaccines are up to date. Ask your health care provider what vaccines you need. What should I do if I need to travel? Follow travel recommendations  from your local health authority, the CDC, and WHO. Travel information and advice  Centers for Disease Control and Prevention (CDC): GeminiCard.gl  World Health Organization [facility]): PreviewDomains.se Know the risks and take action to protect your health  You are at higher risk of getting coronavirus disease if you are traveling to areas with an outbreak or if you are exposed to travelers from areas with an outbreak.  Wash your hands often and practice good hygiene to lower the risk of catching or spreading the virus. What should I do if I am sick? General instructions to stop the spread of infection  Wash your hands often with soap and water for at least 20 seconds. If soap and water are not available, use alcohol-based hand sanitizer.  Cough or sneeze into a tissue, sleeve, or elbow. Do not cough or sneeze into your hand or the air.  If you cough or sneeze into a tissue, throw it away immediately  and wash your hands.  Stay home unless you must get medical care. Call your health care provider or local health authority before you get medical care.  Avoid public areas. Do not take public transportation, if possible.  If you can, wear a mask if you must go out of the house or if you are in close contact with someone who is not sick. Make sure your mask covers your nose and mouth. Keep your home clean  Disinfect objects and surfaces that are frequently touched every day. This may include: ? Counters and tables. ? Doorknobs and light switches. ? Sinks and faucets. ? Electronics such as phones, remote controls, keyboards, computers, and tablets.  Wash dishes in hot, soapy water or use a dishwasher. Air-dry your dishes.  Wash laundry in hot water. Prevent infecting other household members  Let healthy household members care for children and pets, if possible. If you have to care for children or  pets, wash your hands often and wear a mask.  Sleep in a different bedroom or bed, if possible.  Do not share personal items, such as razors, toothbrushes, deodorant, combs, brushes, towels, and washcloths. Where to find more information Centers for Disease Control and Prevention (CDC)  Information and news updates: CardRetirement.cz World Health Organization [facility])  Information and news updates: AffordableSalon.es  Coronavirus health topic: https://thompson-craig.com/  Questions and answers on COVID-19: kruiseway.com  Global tracker: who.sprinklr.com American Academy of Pediatrics (AAP)  Information for families: www.healthychildren.org/English/health-issues/conditions/chest-lungs/Pages/2019-Novel-Coronavirus.aspx The coronavirus situation is changing rapidly. Check your local health authority website or the CDC and [facility] websites for updates and news. When should I contact a health care provider?  Contact your health care provider if you have symptoms of an infection, such as fever or cough, and you: ? Have been near anyone who is known to have coronavirus disease. ? Have come into contact with a person who is suspected to have coronavirus disease. ? Have traveled to an area where there is an outbreak of COVID-19. When should I get emergency medical care?  Get help right away by calling your local emergency services (911 in the [place] if you have: ? Trouble breathing. ? Pain or pressure in your chest. ? Confusion. ? Blue-tinged lips and fingernails. ? Difficulty waking from sleep. ? Symptoms that get worse. Let the emergency medical personnel know if you think you have coronavirus disease. Summary  A new respiratory virus is spreading from person to person and causing COVID-19 (coronavirus disease).  The virus that causes COVID-19 appears to spread easily. It spreads from one  person to another through droplets from breathing, speaking, singing, coughing, or sneezing.  Older adults and those with chronic diseases are at higher risk of disease. If you are at higher risk for complications, take extra precautions.  There is currently no vaccine to prevent coronavirus disease. There are no medicines, such as antibiotics or antivirals, to treat the virus.  You can protect yourself and your family by washing your hands often, avoiding touching your face, and covering your coughs and sneezes. This information is not intended to replace advice given to you by your health care provider. Make sure you discuss any questions you have with your health care provider. Document Revised: 11/27/2018 Document Reviewed: 05/26/2018 Elsevier Patient Education  2020 Elsevier Inc.  COVID-19: How to Protect Yourself and Others Know how it spreads  There is currently no vaccine to prevent coronavirus disease 2019 (COVID-19).  The best way to prevent illness is to avoid being exposed  to this virus.  The virus is thought to spread mainly from person-to-person. ? Between people who are in close contact with one another (within about 6 feet). ? Through respiratory droplets produced when an infected person coughs, sneezes or talks. ? These droplets can land in the mouths or noses of people who are nearby or possibly be inhaled into the lungs. ? COVID-19 may be spread by people who are not showing symptoms. Everyone should Clean your hands often  Wash your hands often with soap and water for at least 20 seconds especially after you have been in a public place, or after blowing your nose, coughing, or sneezing.  If soap and water are not readily available, use a hand sanitizer that contains at least 60% alcohol. Cover all surfaces of your hands and rub them together until they feel dry.  Avoid touching your eyes, nose, and mouth with unwashed hands. Avoid close contact  Limit contact  with others as much as possible.  Avoid close contact with people who are sick.  Put distance between yourself and other people. ? Remember that some people without symptoms may be able to spread virus. ? This is especially important for people who are at higher risk of getting very RetroStamps.it Cover your mouth and nose with a mask when around others  You could spread COVID-19 to others even if you do not feel sick.  Everyone should wear a mask in public settings and when around people not living in their household, especially when social distancing is difficult to maintain. ? Masks should not be placed on young children under age 39, anyone who has trouble breathing, or is unconscious, incapacitated or otherwise unable to remove the mask without assistance.  The mask is meant to protect other people in case you are infected.  Do NOT use a facemask meant for a Research scientist (physical sciences).  Continue to keep about 6 feet between yourself and others. The mask is not a substitute for social distancing. Cover coughs and sneezes  Always cover your mouth and nose with a tissue when you cough or sneeze or use the inside of your elbow.  Throw used tissues in the trash.  Immediately wash your hands with soap and water for at least 20 seconds. If soap and water are not readily available, clean your hands with a hand sanitizer that contains at least 60% alcohol. Clean and disinfect  Clean AND disinfect frequently touched surfaces daily. This includes tables, doorknobs, light switches, countertops, handles, desks, phones, keyboards, toilets, faucets, and sinks. ktimeonline.com  If surfaces are dirty, clean them: Use detergent or soap and water prior to disinfection.  Then, use a household disinfectant. You can see a list of EPA-registered household disinfectants  here. SouthAmericaFlowers.co.uk 10/14/2018 This information is not intended to replace advice given to you by your health care provider. Make sure you discuss any questions you have with your health care provider. Document Revised: 10/22/2018 Document Reviewed: 08/20/2018 Elsevier Patient Education  2020 [employer].

## 2019-10-28 ENCOUNTER — Ambulatory Visit (HOSPITAL_COMMUNITY)
Admission: RE | Admit: 2019-10-28 | Discharge: 2019-10-28 | Disposition: A | Discharge status: Home / Self Care | Payer: Medicare HMO | Source: Ambulatory Visit | Attending: Pulmonary Disease | Admitting: Pulmonary Disease

## 2019-10-28 DIAGNOSIS — U071 COVID-19: Secondary | ICD-10-CM | POA: Insufficient documentation

## 2019-10-28 DIAGNOSIS — J1282 Pneumonia due to coronavirus disease 2019: Secondary | ICD-10-CM | POA: Diagnosis not present

## 2019-10-28 MED ORDER — FAMOTIDINE IN NACL 20-0.9 MG/50ML-% IV SOLN: 20.0000 mg | Freq: Once | INTRAVENOUS | Status: DC | PRN

## 2019-10-28 MED ORDER — DIPHENHYDRAMINE HCL 50 MG/ML IJ SOLN: 50.0000 mg | Freq: Once | INTRAMUSCULAR | Status: DC | PRN

## 2019-10-28 MED ORDER — SODIUM CHLORIDE 0.9 % IV SOLN: INTRAVENOUS | Status: DC | PRN

## 2019-10-28 MED ORDER — ALBUTEROL SULFATE HFA 108 (90 BASE) MCG/ACT IN AERS: 2.0000 | INHALATION_SPRAY | Freq: Once | RESPIRATORY_TRACT | Status: DC | PRN

## 2019-10-28 MED ORDER — EPINEPHRINE 0.3 MG/0.3ML IJ SOAJ: 0.3000 mg | Freq: Once | INTRAMUSCULAR | Status: DC | PRN

## 2019-10-28 MED ORDER — SODIUM CHLORIDE 0.9 % IV SOLN
100.0000 mg | Freq: Once | INTRAVENOUS | Status: AC
  Administered 2019-10-28: 100 mg via INTRAVENOUS
  Filled 2019-10-28: qty 20

## 2019-10-28 MED ORDER — METHYLPREDNISOLONE SODIUM SUCC 125 MG IJ SOLR: 125.0000 mg | Freq: Once | INTRAMUSCULAR | Status: DC | PRN

## 2019-10-28 NOTE — Progress Notes (Signed)
°  Diagnosis: COVID-19  Physician: Dr Delford Field  Procedure: [facility] Infusion Clinic Med: remdesivir infusion - Provided patient with remdesivir fact sheet for patients, parents and caregivers prior to infusion.  Complications: No immediate complications noted.  Discharge: Discharged home   Reginia Forts [place]arece 10/28/2019

## 2019-10-28 NOTE — Discharge Instructions (Signed)
10 Things You Can Do to Manage Your COVID-19 Symptoms at Home If you have possible or confirmed COVID-19: 1. Stay home from work and school. And stay away from other public places. If you must go out, avoid using any kind of public transportation, ridesharing, or taxis. 2. Monitor your symptoms carefully. If your symptoms get worse, call your healthcare provider immediately. 3. Get rest and stay hydrated. 4. If you have a medical appointment, call the healthcare provider ahead of time and tell them that you have or may have COVID-19. 5. For medical emergencies, call 911 and notify the dispatch personnel that you have or may have COVID-19. 6. Cover your cough and sneezes with a tissue or use the inside of your elbow. 7. Wash your hands often with soap and water for at least 20 seconds or clean your hands with an alcohol-based hand sanitizer that contains at least 60% alcohol. 8. As much as possible, stay in a specific room and away from other people in your home. Also, you should use a separate bathroom, if available. If you need to be around other people in or outside of the home, wear a mask. 9. Avoid sharing personal items with other people in your household, like dishes, towels, and bedding. 10. Clean all surfaces that are touched often, like counters, tabletops, and doorknobs. Use household cleaning sprays or wipes according to the label instructions. cdc.gov/coronavirus 08/12/2018 This information is not intended to replace advice given to you by your health care provider. Make sure you discuss any questions you have with your health care provider. Document Revised: 01/14/2019 Document Reviewed: 01/14/2019 Elsevier Patient Education  2020 Elsevier Inc.  

## 2019-10-29 ENCOUNTER — Ambulatory Visit (HOSPITAL_COMMUNITY)
Admit: 2019-10-29 | Discharge: 2019-10-29 | Disposition: A | Discharge status: Home / Self Care | Payer: Medicare HMO | Source: Ambulatory Visit | Attending: Pulmonary Disease | Admitting: Pulmonary Disease

## 2019-10-29 DIAGNOSIS — U071 COVID-19: Secondary | ICD-10-CM | POA: Insufficient documentation

## 2019-10-29 DIAGNOSIS — J1282 Pneumonia due to coronavirus disease 2019: Secondary | ICD-10-CM | POA: Insufficient documentation

## 2019-10-29 LAB — CULTURE, BLOOD (ROUTINE X 2): Culture: NO GROWTH

## 2019-10-29 MED ORDER — EPINEPHRINE 0.3 MG/0.3ML IJ SOAJ: 0.3000 mg | Freq: Once | INTRAMUSCULAR | Status: DC | PRN

## 2019-10-29 MED ORDER — SODIUM CHLORIDE 0.9 % IV SOLN
100.0000 mg | Freq: Once | INTRAVENOUS | Status: AC
  Administered 2019-10-29: 100 mg via INTRAVENOUS
  Filled 2019-10-29: qty 20

## 2019-10-29 MED ORDER — FAMOTIDINE IN NACL 20-0.9 MG/50ML-% IV SOLN: 20.0000 mg | Freq: Once | INTRAVENOUS | Status: DC | PRN

## 2019-10-29 MED ORDER — SODIUM CHLORIDE 0.9 % IV SOLN: INTRAVENOUS | Status: DC | PRN

## 2019-10-29 MED ORDER — DIPHENHYDRAMINE HCL 50 MG/ML IJ SOLN: 50.0000 mg | Freq: Once | INTRAMUSCULAR | Status: DC | PRN

## 2019-10-29 MED ORDER — METHYLPREDNISOLONE SODIUM SUCC 125 MG IJ SOLR: 125.0000 mg | Freq: Once | INTRAMUSCULAR | Status: DC | PRN

## 2019-10-29 MED ORDER — ALBUTEROL SULFATE HFA 108 (90 BASE) MCG/ACT IN AERS: 2.0000 | INHALATION_SPRAY | Freq: Once | RESPIRATORY_TRACT | Status: DC | PRN

## 2019-10-29 NOTE — Discharge Instructions (Signed)
10 Things You Can Do to Manage Your COVID-19 Symptoms at Home If you have possible or confirmed COVID-19: 1. Stay home from work and school. And stay away from other public places. If you must go out, avoid using any kind of public transportation, ridesharing, or taxis. 2. Monitor your symptoms carefully. If your symptoms get worse, call your healthcare provider immediately. 3. Get rest and stay hydrated. 4. If you have a medical appointment, call the healthcare provider ahead of time and tell them that you have or may have COVID-19. 5. For medical emergencies, call 911 and notify the dispatch personnel that you have or may have COVID-19. 6. Cover your cough and sneezes with a tissue or use the inside of your elbow. 7. Wash your hands often with soap and water for at least 20 seconds or clean your hands with an alcohol-based hand sanitizer that contains at least 60% alcohol. 8. As much as possible, stay in a specific room and away from other people in your home. Also, you should use a separate bathroom, if available. If you need to be around other people in or outside of the home, wear a mask. 9. Avoid sharing personal items with other people in your household, like dishes, towels, and bedding. 10. Clean all surfaces that are touched often, like counters, tabletops, and doorknobs. Use household cleaning sprays or wipes according to the label instructions. cdc.gov/coronavirus 08/12/2018 This information is not intended to replace advice given to you by your health care provider. Make sure you discuss any questions you have with your health care provider. Document Revised: 01/14/2019 Document Reviewed: 01/14/2019 Elsevier Patient Education  2020 Elsevier Inc.  

## 2019-11-02 ENCOUNTER — Emergency Department (HOSPITAL_COMMUNITY): Payer: Medicare HMO

## 2019-11-02 ENCOUNTER — Other Ambulatory Visit: Payer: Self-pay

## 2019-11-02 ENCOUNTER — Inpatient Hospital Stay (HOSPITAL_COMMUNITY)
Admission: EM | Admit: 2019-11-02 | Discharge: 2019-12-13 | DRG: 208 | Disposition: E | Payer: Medicare HMO | Attending: Pulmonary Disease | Admitting: Pulmonary Disease

## 2019-11-02 DIAGNOSIS — N1832 Chronic kidney disease, stage 3b: Secondary | ICD-10-CM | POA: Diagnosis present

## 2019-11-02 DIAGNOSIS — I69351 Hemiplegia and hemiparesis following cerebral infarction affecting right dominant side: Secondary | ICD-10-CM | POA: Diagnosis not present

## 2019-11-02 DIAGNOSIS — N1831 Chronic kidney disease, stage 3a: Secondary | ICD-10-CM | POA: Diagnosis not present

## 2019-11-02 DIAGNOSIS — G9349 Other encephalopathy: Secondary | ICD-10-CM | POA: Diagnosis present

## 2019-11-02 DIAGNOSIS — E872 Acidosis: Secondary | ICD-10-CM | POA: Diagnosis present

## 2019-11-02 DIAGNOSIS — I739 Peripheral vascular disease, unspecified: Secondary | ICD-10-CM | POA: Diagnosis present

## 2019-11-02 DIAGNOSIS — G935 Compression of brain: Secondary | ICD-10-CM | POA: Diagnosis not present

## 2019-11-02 DIAGNOSIS — R29710 NIHSS score 10: Secondary | ICD-10-CM | POA: Diagnosis present

## 2019-11-02 DIAGNOSIS — M1990 Unspecified osteoarthritis, unspecified site: Secondary | ICD-10-CM | POA: Diagnosis present

## 2019-11-02 DIAGNOSIS — Z8673 Personal history of transient ischemic attack (TIA), and cerebral infarction without residual deficits: Secondary | ICD-10-CM | POA: Diagnosis not present

## 2019-11-02 DIAGNOSIS — R000 Tachycardia, unspecified: Secondary | ICD-10-CM | POA: Diagnosis present

## 2019-11-02 DIAGNOSIS — J1282 Pneumonia due to coronavirus disease 2019: Secondary | ICD-10-CM | POA: Diagnosis present

## 2019-11-02 DIAGNOSIS — D696 Thrombocytopenia, unspecified: Secondary | ICD-10-CM | POA: Diagnosis present

## 2019-11-02 DIAGNOSIS — N400 Enlarged prostate without lower urinary tract symptoms: Secondary | ICD-10-CM | POA: Diagnosis present

## 2019-11-02 DIAGNOSIS — T380X5A Adverse effect of glucocorticoids and synthetic analogues, initial encounter: Secondary | ICD-10-CM | POA: Diagnosis not present

## 2019-11-02 DIAGNOSIS — R778 Other specified abnormalities of plasma proteins: Secondary | ICD-10-CM | POA: Diagnosis present

## 2019-11-02 DIAGNOSIS — N179 Acute kidney failure, unspecified: Secondary | ICD-10-CM | POA: Diagnosis present

## 2019-11-02 DIAGNOSIS — Z9181 History of falling: Secondary | ICD-10-CM

## 2019-11-02 DIAGNOSIS — G9382 Brain death: Secondary | ICD-10-CM | POA: Diagnosis not present

## 2019-11-02 DIAGNOSIS — I619 Nontraumatic intracerebral hemorrhage, unspecified: Secondary | ICD-10-CM | POA: Diagnosis present

## 2019-11-02 DIAGNOSIS — U071 COVID-19: Secondary | ICD-10-CM | POA: Diagnosis present

## 2019-11-02 DIAGNOSIS — I6389 Other cerebral infarction: Secondary | ICD-10-CM | POA: Diagnosis not present

## 2019-11-02 DIAGNOSIS — I129 Hypertensive chronic kidney disease with stage 1 through stage 4 chronic kidney disease, or unspecified chronic kidney disease: Secondary | ICD-10-CM | POA: Diagnosis present

## 2019-11-02 DIAGNOSIS — N189 Chronic kidney disease, unspecified: Secondary | ICD-10-CM | POA: Diagnosis present

## 2019-11-02 DIAGNOSIS — I615 Nontraumatic intracerebral hemorrhage, intraventricular: Secondary | ICD-10-CM | POA: Diagnosis not present

## 2019-11-02 DIAGNOSIS — I959 Hypotension, unspecified: Secondary | ICD-10-CM | POA: Diagnosis not present

## 2019-11-02 DIAGNOSIS — Z66 Do not resuscitate: Secondary | ICD-10-CM | POA: Diagnosis not present

## 2019-11-02 DIAGNOSIS — K921 Melena: Secondary | ICD-10-CM | POA: Diagnosis not present

## 2019-11-02 DIAGNOSIS — Y92239 Unspecified place in hospital as the place of occurrence of the external cause: Secondary | ICD-10-CM | POA: Diagnosis not present

## 2019-11-02 DIAGNOSIS — Z79899 Other long term (current) drug therapy: Secondary | ICD-10-CM

## 2019-11-02 DIAGNOSIS — R001 Bradycardia, unspecified: Secondary | ICD-10-CM | POA: Diagnosis not present

## 2019-11-02 DIAGNOSIS — I10 Essential (primary) hypertension: Secondary | ICD-10-CM | POA: Diagnosis present

## 2019-11-02 DIAGNOSIS — R739 Hyperglycemia, unspecified: Secondary | ICD-10-CM | POA: Diagnosis not present

## 2019-11-02 DIAGNOSIS — G919 Hydrocephalus, unspecified: Secondary | ICD-10-CM | POA: Diagnosis not present

## 2019-11-02 DIAGNOSIS — E876 Hypokalemia: Secondary | ICD-10-CM | POA: Diagnosis present

## 2019-11-02 DIAGNOSIS — R29810 Facial weakness: Secondary | ICD-10-CM | POA: Diagnosis present

## 2019-11-02 DIAGNOSIS — R680 Hypothermia, not associated with low environmental temperature: Secondary | ICD-10-CM | POA: Diagnosis not present

## 2019-11-02 DIAGNOSIS — R0602 Shortness of breath: Secondary | ICD-10-CM

## 2019-11-02 DIAGNOSIS — G9341 Metabolic encephalopathy: Secondary | ICD-10-CM | POA: Diagnosis present

## 2019-11-02 DIAGNOSIS — E785 Hyperlipidemia, unspecified: Secondary | ICD-10-CM | POA: Diagnosis present

## 2019-11-02 DIAGNOSIS — R7303 Prediabetes: Secondary | ICD-10-CM | POA: Diagnosis present

## 2019-11-02 DIAGNOSIS — J9601 Acute respiratory failure with hypoxia: Secondary | ICD-10-CM | POA: Diagnosis present

## 2019-11-02 DIAGNOSIS — R569 Unspecified convulsions: Secondary | ICD-10-CM | POA: Diagnosis not present

## 2019-11-02 DIAGNOSIS — R0902 Hypoxemia: Secondary | ICD-10-CM

## 2019-11-02 DIAGNOSIS — R7989 Other specified abnormal findings of blood chemistry: Secondary | ICD-10-CM | POA: Diagnosis not present

## 2019-11-02 DIAGNOSIS — Z7902 Long term (current) use of antithrombotics/antiplatelets: Secondary | ICD-10-CM

## 2019-11-02 DIAGNOSIS — K5900 Constipation, unspecified: Secondary | ICD-10-CM | POA: Diagnosis not present

## 2019-11-02 DIAGNOSIS — G8191 Hemiplegia, unspecified affecting right dominant side: Secondary | ICD-10-CM

## 2019-11-02 DIAGNOSIS — Z833 Family history of diabetes mellitus: Secondary | ICD-10-CM

## 2019-11-02 DIAGNOSIS — Z01818 Encounter for other preprocedural examination: Secondary | ICD-10-CM

## 2019-11-02 DIAGNOSIS — I469 Cardiac arrest, cause unspecified: Secondary | ICD-10-CM | POA: Diagnosis not present

## 2019-11-02 LAB — PROTIME-INR
INR: 1.3 — ABNORMAL HIGH (ref 0.8–1.2)
Prothrombin Time: 15.2 seconds (ref 11.4–15.2)

## 2019-11-02 LAB — URINALYSIS, ROUTINE W REFLEX MICROSCOPIC
Bilirubin Urine: NEGATIVE
Glucose, UA: NEGATIVE mg/dL
Ketones, ur: NEGATIVE mg/dL
Leukocytes,Ua: NEGATIVE
Nitrite: NEGATIVE
Protein, ur: 100 mg/dL — AB
Specific Gravity, Urine: 1.013 (ref 1.005–1.030)
pH: 5 (ref 5.0–8.0)

## 2019-11-02 LAB — COMPREHENSIVE METABOLIC PANEL
ALT: 36 U/L (ref 0–44)
AST: 29 U/L (ref 15–41)
Albumin: 2.8 g/dL — ABNORMAL LOW (ref 3.5–5.0)
Alkaline Phosphatase: 74 U/L (ref 38–126)
Anion gap: 15 (ref 5–15)
BUN: 27 mg/dL — ABNORMAL HIGH (ref 8–23)
CO2: 22 mmol/L (ref 22–32)
Calcium: 8 mg/dL — ABNORMAL LOW (ref 8.9–10.3)
Chloride: 109 mmol/L (ref 98–111)
Creatinine, Ser: 2.07 mg/dL — ABNORMAL HIGH (ref 0.61–1.24)
GFR calc Af Amer: 36 mL/min — ABNORMAL LOW (ref >60)
GFR calc non Af Amer: 31 mL/min — ABNORMAL LOW (ref >60)
Glucose, Bld: 153 mg/dL — ABNORMAL HIGH (ref 70–99)
Potassium: 3.6 mmol/L (ref 3.5–5.1)
Sodium: 146 mmol/L — ABNORMAL HIGH (ref 135–145)
Total Bilirubin: 2.1 mg/dL — ABNORMAL HIGH (ref 0.3–1.2)
Total Protein: 6.1 g/dL — ABNORMAL LOW (ref 6.5–8.1)

## 2019-11-02 LAB — ETHANOL: Alcohol, Ethyl (B): <10 mg/dL (ref <10)

## 2019-11-02 LAB — CBC
HCT: 47.7 % (ref 39.0–52.0)
Hemoglobin: 16 g/dL (ref 13.0–17.0)
MCH: 30.8 pg (ref 26.0–34.0)
MCHC: 33.5 g/dL (ref 30.0–36.0)
MCV: 91.7 fL (ref 80.0–100.0)
Platelets: 179 10*3/uL (ref 150–400)
RBC: 5.2 MIL/uL (ref 4.22–5.81)
RDW: 14.5 % (ref 11.5–15.5)
WBC: 17 10*3/uL — ABNORMAL HIGH (ref 4.0–10.5)
nRBC: 0 % (ref 0.0–0.2)

## 2019-11-02 LAB — I-STAT CHEM 8, ED
BUN: 34 mg/dL — ABNORMAL HIGH (ref 8–23)
Calcium, Ion: 0.89 mmol/L — CL (ref 1.15–1.40)
Chloride: 110 mmol/L (ref 98–111)
Creatinine, Ser: 1.8 mg/dL — ABNORMAL HIGH (ref 0.61–1.24)
Glucose, Bld: 156 mg/dL — ABNORMAL HIGH (ref 70–99)
HCT: 46 % (ref 39.0–52.0)
Hemoglobin: 15.6 g/dL (ref 13.0–17.0)
Potassium: 3.7 mmol/L (ref 3.5–5.1)
Sodium: 145 mmol/L (ref 135–145)
TCO2: 23 mmol/L (ref 22–32)

## 2019-11-02 LAB — D-DIMER, QUANTITATIVE: D-Dimer, Quant: 1.08 ug/mL-FEU — ABNORMAL HIGH (ref 0.00–0.50)

## 2019-11-02 LAB — APTT: aPTT: 28 seconds (ref 24–36)

## 2019-11-02 LAB — DIFFERENTIAL
Abs Immature Granulocytes: 0.28 10*3/uL — ABNORMAL HIGH (ref 0.00–0.07)
Basophils Absolute: 0 10*3/uL (ref 0.0–0.1)
Basophils Relative: 0 %
Eosinophils Absolute: 0 10*3/uL (ref 0.0–0.5)
Eosinophils Relative: 0 %
Immature Granulocytes: 2 %
Lymphocytes Relative: 4 %
Lymphs Abs: 0.7 10*3/uL (ref 0.7–4.0)
Monocytes Absolute: 1.2 10*3/uL — ABNORMAL HIGH (ref 0.1–1.0)
Monocytes Relative: 7 %
Neutro Abs: 14.9 10*3/uL — ABNORMAL HIGH (ref 1.7–7.7)
Neutrophils Relative %: 87 %

## 2019-11-02 LAB — RAPID URINE DRUG SCREEN, HOSP PERFORMED
Amphetamines: NOT DETECTED
Barbiturates: NOT DETECTED
Benzodiazepines: NOT DETECTED
Cocaine: NOT DETECTED
Opiates: NOT DETECTED
Tetrahydrocannabinol: NOT DETECTED

## 2019-11-02 LAB — CULTURE, BLOOD (ROUTINE X 2)

## 2019-11-02 LAB — CBG MONITORING, ED: Glucose-Capillary: 171 mg/dL — ABNORMAL HIGH (ref 70–99)

## 2019-11-02 LAB — FIBRINOGEN: Fibrinogen: 189 mg/dL — ABNORMAL LOW (ref 210–475)

## 2019-11-02 LAB — LACTIC ACID, PLASMA
Lactic Acid, Venous: 1.6 mmol/L (ref 0.5–1.9)
Lactic Acid, Venous: 2.1 mmol/L (ref 0.5–1.9)

## 2019-11-02 MED ORDER — VITAMIN D 25 MCG (1000 UNIT) PO TABS: 1000.0000 [IU] | ORAL_TABLET | Freq: Every day | ORAL | Status: DC

## 2019-11-02 MED ORDER — SENNOSIDES-DOCUSATE SODIUM 8.6-50 MG PO TABS: 1.0000 | ORAL_TABLET | Freq: Two times a day (BID) | ORAL | Status: DC

## 2019-11-02 MED ORDER — DARIFENACIN HYDROBROMIDE ER 7.5 MG PO TB24: 7.5000 mg | ORAL_TABLET | Freq: Every day | ORAL | Status: DC

## 2019-11-02 MED ORDER — ACETAMINOPHEN 325 MG PO TABS
650.0000 mg | ORAL_TABLET | ORAL | Status: DC | PRN
  Administered 2019-11-03 – 2019-11-13 (×6): 650 mg via ORAL
  Filled 2019-11-02 (×6): qty 2

## 2019-11-02 MED ORDER — STROKE: EARLY STAGES OF RECOVERY BOOK: Freq: Once | Status: AC

## 2019-11-02 MED ORDER — SODIUM CHLORIDE 0.9 % IV SOLN
1.0000 g | Freq: Once | INTRAVENOUS | Status: AC
  Administered 2019-11-02: 1 g via INTRAVENOUS
  Filled 2019-11-02: qty 10

## 2019-11-02 MED ORDER — FINASTERIDE 5 MG PO TABS: 5.0000 mg | ORAL_TABLET | Freq: Every day | ORAL | Status: DC

## 2019-11-02 MED ORDER — SODIUM CHLORIDE 0.9 % IV SOLN
500.0000 mg | Freq: Once | INTRAVENOUS | Status: AC
  Administered 2019-11-02: 500 mg via INTRAVENOUS
  Filled 2019-11-02: qty 500

## 2019-11-02 MED ORDER — PREDNISONE 20 MG PO TABS: 50.0000 mg | ORAL_TABLET | Freq: Every day | ORAL | Status: DC

## 2019-11-02 MED ORDER — CLEVIDIPINE BUTYRATE 0.5 MG/ML IV EMUL: 0.0000 mg/h | INTRAVENOUS | Status: DC

## 2019-11-02 MED ORDER — GADOBUTROL 1 MMOL/ML IV SOLN
8.0000 mL | Freq: Once | INTRAVENOUS | Status: AC | PRN
  Administered 2019-11-02: 8 mL via INTRAVENOUS

## 2019-11-02 MED ORDER — PANTOPRAZOLE SODIUM 40 MG IV SOLR
40.0000 mg | Freq: Every day | INTRAVENOUS | Status: DC
  Administered 2019-11-03 – 2019-11-04 (×3): 40 mg via INTRAVENOUS
  Filled 2019-11-02 (×3): qty 40

## 2019-11-02 MED ORDER — LABETALOL HCL 5 MG/ML IV SOLN: 20.0000 mg | Freq: Once | INTRAVENOUS | Status: DC

## 2019-11-02 MED ORDER — ZINC SULFATE 220 (50 ZN) MG PO CAPS: 220.0000 mg | ORAL_CAPSULE | Freq: Every day | ORAL | Status: DC

## 2019-11-02 MED ORDER — BENZONATATE 100 MG PO CAPS: 100.0000 mg | ORAL_CAPSULE | Freq: Four times a day (QID) | ORAL | Status: DC | PRN

## 2019-11-02 MED ORDER — ASCORBIC ACID 500 MG PO TABS: 500.0000 mg | ORAL_TABLET | Freq: Every day | ORAL | Status: DC

## 2019-11-02 MED ORDER — ALBUTEROL SULFATE HFA 108 (90 BASE) MCG/ACT IN AERS: 2.0000 | INHALATION_SPRAY | Freq: Four times a day (QID) | RESPIRATORY_TRACT | Status: DC | PRN

## 2019-11-02 MED ORDER — ACETAMINOPHEN 160 MG/5ML PO SOLN: 650.0000 mg | ORAL | Status: DC | PRN

## 2019-11-02 MED ORDER — ACETAMINOPHEN 650 MG RE SUPP
650.0000 mg | RECTAL | Status: DC | PRN
  Administered 2019-11-03: 650 mg via RECTAL
  Filled 2019-11-02: qty 1

## 2019-11-02 NOTE — H&P (Addendum)
Neurology Consultation Reason for Consult: Right arm weakness Referring Physician: Dr. Jacalyn Lefevre  CC: Right arm weakness   History is obtained from: Patient, family and chart review   HPI: Robert Buckley is a 74 y.o. 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  He had gone to sleep in his usual state of health at 1 PM and woke up at 3 PM with confusion and more difficulty moving his right arm and leg been normal.  Therefore his son activated EMS for code stroke.  EMS noted blood pressure of 130s over 90s, blood glucose of 160, and hypoxia to 90% with improved meant to 95% on 2 L of oxygen.  LKW: 1 PM on 9/21 tPA given?: No, due to hemorrhage in thalamus and out of the window Premorbid modified rankin scale: 2  ICH Score: 1  Time performed: 9/21 GCS: 13-15 is 0 points Infratentorial: No.. If yes, 1 point Volume: <30cc is 0 points  Age: 74 y.o.. >80 is 1 point Intraventricular extension: No (if present is 1 point)  ROS: A 14 point ROS was performed and is negative except as noted in the HPI.  Past Medical History:  Diagnosis Date  . Arthritis   . Hypertension   . Stroke [facility]) 2008   residual R sided weakness    Family History  Problem Relation Age of Onset  . Diabetes Mother    Social History:  reports that he has never smoked. He has never used smokeless tobacco. He reports that he does not drink alcohol and does not use drugs.  Exam: Current vital signs: Temp 97.9 F (36.6 C) (Oral)  Vital signs in last 24 hours: Temp:  [97.9 F (36.6 C)] 97.9 F (36.6 C) (09/21 1744)   Physical Exam  Constitutional: Appears well-developed and well-nourished.  Psych: Affect appropriate to situation  Eyes: No scleral injection HENT: No OP obstruction, fair dentition  MSK: no joint deformities.  Cardiovascular: Normal rate and regular rhythm.  Respiratory: Effort normal,  non-labored breathing GI: Soft.  No distension. There is no tenderness.  Skin: warm dry and intact visible skin   Neuro: Mental Status: Patient is awake, alert, oriented to person, reports it is Dec and he is 74 years old  Patient is able to give limited history. No signs of aphasia or neglect Cranial Nerves: II: Visual Fields are full. Pupils are equal, round, and reactive to light.   III,IV, VI: EOMI without ptosis or diploplia.  V: Facial sensation is symmetric to temperature VII: Facial movement is notable for right droop VIII: hearing is intact to voice X: Uvula not fully visualized  XII: tongue is midline without atrophy or fasciculations.  Motor: Extensor > flexor weakness in the RUE, 3x flexion in the RLE. At least antigravity with the LUE and LLE Sensory: Sensation is symmetric to light touch and temperature in the arms and legs. Deep Tendon Reflexes: 2+ and symmetric in the biceps and patellae.  Plantars: Toes are downgoing bilaterally.  Cerebellar: FNF and HKS are intact within limits of weakness   NIH stroke scale was 10 2 points for LOC questions  1 point for right facial droop,  1 point for left upper extremity weakness,  2 points for right upper extremity weakness,  1 point for left lower extremity weakness,  3 points for right lower extremity weakness   I have reviewed labs in epic and the results pertinent to this consultation are: Creatinine  of 1.8  I have reviewed the images obtained: Head CT with significant chronic strokes and a new hyperdensity concerning for mineralization versus small hemorrhage Follow-up MRI with stable lesion most consistent with mineralization on review with radiology  Impression: 74 year old gentleman with COVID-19 pneumonia presenting with hypoxia and most likely recrudescence of his prior stroke symptoms versus possible postictal state.  Imaging findings are favored to represent a chronic process.  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 - Stroke labs HgbA1c, fasting lipid panel - BP control < 140/90  - MRI brain  - MRA of the brain without contrast and MRA neck w/wo  - HCT at 11:30 PM if MRI cannot be obtained as stability scan  - Frequent neuro checks q1hr until confirmed bleed stable - Echocardiogram - No ASA due to hemorrhage; hold home Plavix due to hemorrhage - Risk factor modification - Telemetry monitoring;  - PT consult, OT consult, Speech consult, - Stroke team to follow  # COVID19 PNA with hypoxia on EMS arrival - Appreciate management per ED  Brooke Dare MD-PhD [facility] [phone]  Updated recommendations: Given stability on MRI scan with findings favored to represent mineralization, can relax neuro checks to every 4 hours.  No additional scans are needed unless there is a change in his clinical status.

## 2019-11-02 NOTE — ED Triage Notes (Signed)
Pt via [facility] as Code Stroke, LSN 1300 prior to lying down for a nap. Woke up at 1600 and his sons noted greater R sided weakness than is the patient's baseline, also some confusion. Covid positive Sunday, 90% SpO2 room air.

## 2019-11-02 NOTE — Progress Notes (Signed)
Patient from home where he lives with his sons. He was LSN at 1300 when he laid down for a nap. He woke at 1600 and his sons noticed worsened right sided weakness and some confusion. Pt was seen on 9/12 for similar stroke like symptoms and has had a stroke in the past. He tested positive for COVID on 9/19. Patient is on Plavix and does have a hx of HTN, CVA, and chronic kidney disease. [facility] arrived and called the code stroke. They brought pt to [facility] where he was cleared by EDP and taken for CT scan. CTA was deferred per MD d/t kidney function and no concern for LVO based on assessment findings. Patient is outside the window for TPA. CarePlan: routine MRI, q2x12, then q4 VS/neuro checks. Hand off with Maralyn Sago, RN. Maximiano Lott, [place] Scrape, RN

## 2019-11-02 NOTE — ED Provider Notes (Signed)
[facility]ERGENCY DEPARTMENT Provider Note   CSN: [id number] Arrival date & time: 10/20/2019  1709  An emergency department physician performed an initial assessment on this suspected stroke patient at 1709.  History Chief Complaint  Patient presents with  . Code Stroke    Robert Buckley is a 74 y.o. male.  Pt presents to the ED today with right sided weakness.  Pt has a hx of a prior stroke with right sided weakness, but it is worse today.  Pt is also a little confused.  Pt was diagnosed with Covid pneumonia on 10/24/19 and was admitted until 9/15.  He was not d/c with oxygen.  He finished his day 4 and 5 Remdesivir treatment at [facility].  Code stroke called by EMS and neurology met him at the bridge.  LSN at 1300.          Past Medical History:  Diagnosis Date  . Arthritis   . Hypertension   . Stroke [facility]) 2008   residual R sided weakness    Patient Active Problem List   Diagnosis Date Noted  . Acute metabolic encephalopathy [id number]/[id number]  . Acute hypoxemic respiratory failure ([place]) 11/03/2019  . ICH (intracerebral hemorrhage) ([place]) 11/01/2019  . Pneumonia due to COVID-19 virus 10/25/2019  . Urinary incontinence   . Urinary frequency   . Benign essential HTN   . Right spastic hemiparesis ([place])   . Stage 3 chronic kidney disease   . Left sided cerebral hemisphere cerebrovascular accident (CVA) ([place]) 10/01/2016  . Right hemiparesis ([place]) 10/01/2016  . Acute CVA (cerebrovascular accident) ([place]) 09/30/2016  . Dyslipidemia 09/30/2016  . Hypomagnesemia 09/30/2016  . Acute on chronic kidney failure ([place]) 09/30/2016  . Hypokalemia 09/30/2016  . Fall at home, initial encounter 09/30/2016  . Ataxia 09/30/2016  . Essential hypertension   . UTI (urinary tract infection) 09/28/2016    No past surgical history on file.     Family History  Problem Relation Age of Onset  . Diabetes Mother     Social History   Tobacco Use  . Smoking status: Never Smoker   . Smokeless tobacco: Never Used  Vaping Use  . Vaping Use: Never used  Substance Use Topics  . Alcohol use: No  . Drug use: No    Home Medications Prior to Admission medications   Medication Sig Start Date End Date Taking? Authorizing Provider  amLODipine (NORVASC) 10 MG tablet Take 10 mg by mouth daily. 10/24/19  Yes [provider]  ascorbic acid (VITAMIN C) 500 MG tablet Take 1 tablet (500 mg total) by mouth daily. 10/27/19 11/26/19 Yes Sreenath, Sudheer B, MD  atorvastatin (LIPITOR) 20 MG tablet Take 20 mg by mouth daily. 07/29/19  Yes [provider]  clopidogrel (PLAVIX) 75 MG tablet Take 1 tablet (75 mg total) by mouth daily. 10/16/16  Yes Angiulli, Lavon Paganini, PA-C  finasteride (PROSCAR) 5 MG tablet Take 1 tablet (5 mg total) by mouth daily. 10/16/16  Yes Angiulli, Lavon Paganini, PA-C  metoprolol succinate (TOPROL-XL) 50 MG 24 hr tablet Take 1 tablet (50 mg total) by mouth daily. Take with or immediately following a meal. 10/16/16  Yes Angiulli, Lavon Paganini, PA-C  Vitamin D3 (VITAMIN D) 25 MCG tablet Take 1 tablet (1,000 Units total) by mouth daily. 10/27/19 11/26/19 Yes Sreenath, Sudheer B, MD  zinc sulfate 220 (50 Zn) MG capsule Take 1 capsule (220 mg total) by mouth daily. 10/27/19 11/26/19 Yes Sreenath, Sudheer B, MD  albuterol (VENTOLIN HFA) 108 (90  Base) MCG/ACT inhaler Inhale 2 puffs into the lungs every 6 (six) hours as needed for wheezing or shortness of breath. Patient not taking: Reported on 11/03/2019 10/27/19   Sidney Ace, MD  benzonatate (TESSALON PERLES) 100 MG capsule Take 1 capsule (100 mg total) by mouth every 6 (six) hours as needed for cough. Patient not taking: Reported on 11/03/2019 10/27/19 10/26/20  Sidney Ace, MD    Allergies    Sulfa antibiotics  Review of Systems   Review of Systems  Neurological: Positive for weakness.  All other systems reviewed and are negative.   Physical Exam Updated Vital Signs BP 137/90   Pulse 83   Temp 98.9  F (37.2 C) (Oral)   Resp (!) 22   SpO2 94%   Physical Exam Vitals and nursing note reviewed.  Constitutional:      Appearance: Normal appearance.  HENT:     Head: Normocephalic and atraumatic.     Right Ear: External ear normal.     Left Ear: External ear normal.     Nose: Nose normal.     Mouth/Throat:     Mouth: Mucous membranes are moist.     Pharynx: Oropharynx is clear.  Eyes:     Extraocular Movements: Extraocular movements intact.     Conjunctiva/sclera: Conjunctivae normal.     Pupils: Pupils are equal, round, and reactive to light.  Cardiovascular:     Rate and Rhythm: Normal rate and regular rhythm.     Pulses: Normal pulses.     Heart sounds: Normal heart sounds.  Pulmonary:     Effort: Pulmonary effort is normal.     Breath sounds: Normal breath sounds.  Abdominal:     General: Abdomen is flat. Bowel sounds are normal.     Palpations: Abdomen is soft.  Musculoskeletal:        General: Normal range of motion.     Cervical back: Normal range of motion and neck supple.  Skin:    General: Skin is warm.     Capillary Refill: Capillary refill takes less than 2 seconds.  Neurological:     Mental Status: He is alert. He is disoriented.     Comments: Right sided weakness  Psychiatric:        Mood and Affect: Mood normal.        Behavior: Behavior normal.        Thought Content: Thought content normal.        Judgment: Judgment normal.     ED Results / Procedures / Treatments   Labs (all labs ordered are listed, but only abnormal results are displayed) Labs Reviewed  PROTIME-INR - Abnormal; Notable for the following components:      Result Value   INR 1.3 (*)    All other components within normal limits  CBC - Abnormal; Notable for the following components:   WBC 17.0 (*)    All other components within normal limits  DIFFERENTIAL - Abnormal; Notable for the following components:   Neutro Abs 14.9 (*)    Monocytes Absolute 1.2 (*)    Abs Immature  Granulocytes 0.28 (*)    All other components within normal limits  COMPREHENSIVE METABOLIC PANEL - Abnormal; Notable for the following components:   Sodium 146 (*)    Glucose, Bld 153 (*)    BUN 27 (*)    Creatinine, Ser 2.07 (*)    Calcium 8.0 (*)    Total Protein 6.1 (*)    Albumin  2.8 (*)    Total Bilirubin 2.1 (*)    GFR calc non Af Amer 31 (*)    GFR calc Af Amer 36 (*)    All other components within normal limits  URINALYSIS, ROUTINE W REFLEX MICROSCOPIC - Abnormal; Notable for the following components:   Hgb urine dipstick SMALL (*)    Protein, ur 100 (*)    Bacteria, UA RARE (*)    All other components within normal limits  LACTIC ACID, PLASMA - Abnormal; Notable for the following components:   Lactic Acid, Venous 2.1 (*)    All other components within normal limits  D-DIMER, QUANTITATIVE (NOT AT [facility]) - Abnormal; Notable for the following components:   D-Dimer, Quant 1.08 (*)    All other components within normal limits  LACTATE DEHYDROGENASE - Abnormal; Notable for the following components:   LDH 348 (*)    All other components within normal limits  FERRITIN - Abnormal; Notable for the following components:   Ferritin 433 (*)    All other components within normal limits  FIBRINOGEN - Abnormal; Notable for the following components:   Fibrinogen 189 (*)    All other components within normal limits  C-REACTIVE PROTEIN - Abnormal; Notable for the following components:   CRP 11.2 (*)    All other components within normal limits  CBC - Abnormal; Notable for the following components:   WBC 14.4 (*)    Platelets 122 (*)    All other components within normal limits  COMPREHENSIVE METABOLIC PANEL - Abnormal; Notable for the following components:   Potassium 3.4 (*)    Chloride 112 (*)    CO2 20 (*)    Glucose, Bld 138 (*)    Creatinine, Ser 1.69 (*)    Calcium 6.7 (*)    Total Protein 4.9 (*)    Albumin 2.1 (*)    Total Bilirubin 1.6 (*)    GFR calc non Af Amer 39  (*)    GFR calc Af Amer 46 (*)    All other components within normal limits  BRAIN NATRIURETIC PEPTIDE - Abnormal; Notable for the following components:   B Natriuretic Peptide 1,685.2 (*)    All other components within normal limits  I-STAT CHEM 8, ED - Abnormal; Notable for the following components:   BUN 34 (*)    Creatinine, Ser 1.80 (*)    Glucose, Bld 156 (*)    Calcium, Ion 0.89 (*)    All other components within normal limits  CBG MONITORING, ED - Abnormal; Notable for the following components:   Glucose-Capillary 171 (*)    All other components within normal limits  CULTURE, BLOOD (ROUTINE X 2)  CULTURE, BLOOD (ROUTINE X 2)  CULTURE, BLOOD (ROUTINE X 2)  CULTURE, BLOOD (ROUTINE X 2)  ETHANOL  APTT  RAPID URINE DRUG SCREEN, HOSP PERFORMED  LACTIC ACID, PLASMA  PROCALCITONIN  TRIGLYCERIDES  C-REACTIVE PROTEIN  COMPREHENSIVE METABOLIC PANEL  CBC WITH DIFFERENTIAL/PLATELET  D-DIMER, QUANTITATIVE (NOT AT [facility])  FERRITIN  FIBRINOGEN  HEPATITIS B SURFACE ANTIGEN  LACTATE DEHYDROGENASE  PROCALCITONIN  VITAMIN B12  FOLATE  AMMONIA  RPR  MAGNESIUM  TROPONIN I (HIGH SENSITIVITY)  TROPONIN I (HIGH SENSITIVITY)    EKG EKG Interpretation  Date/Time:  Tuesday November 02 2019 17:36:05 EDT Ventricular Rate:  79 PR Interval:    QRS Duration: 103 QT Interval:  379 QTC Calculation: 435 R Axis:   -45 Text Interpretation: Sinus rhythm Left anterior fascicular block Borderline low voltage, extremity  leads Abnormal R-wave progression, early transition Nonspecific repol abnormality, diffuse leads No significant change since last tracing Confirmed by Isla Pence [phone]) on 10/23/2019 5:39:32 PM   Radiology MR ANGIO HEAD WO CONTRAST  Result Date: 11/01/2019 CLINICAL DATA:  Initial evaluation for acute stroke, right-sided weakness. EXAM: MRI HEAD WITHOUT CONTRAST MRA HEAD WITHOUT CONTRAST MRA NECK WITHOUT AND WITH CONTRAST TECHNIQUE: Multiplanar, multiecho pulse sequences  of the brain and surrounding structures were obtained without intravenous contrast. Angiographic images of the Circle of Willis were obtained using MRA technique without intravenous contrast. Angiographic images of the neck were obtained using MRA technique without and with intravenous contrast. Carotid stenosis measurements (when applicable) are obtained utilizing NASCET criteria, using the distal internal carotid diameter as the denominator. CONTRAST:  74m GADAVIST GADOBUTROL 1 MMOL/ML IV SOLN COMPARISON:  Comparison made with prior head CT from earlier the same day. FINDINGS: MRI HEAD FINDINGS Brain: Examination moderately degraded by motion artifact. Diffuse prominence of the CSF containing spaces compatible with generalized age-related cerebral atrophy, moderately advanced for age. Patchy and confluent T2/FLAIR hyperintensity within the periventricular and deep white matter both cerebral hemispheres most consistent with chronic small vessel ischemic disease, moderate in nature. Patchy involvement of the pons noted. Multiple scattered remote lacunar infarcts present about the bilateral basal ganglia, thalami, and hemispheric cerebral white matter. No abnormal foci of restricted diffusion to suggest acute or subacute ischemia. Gray-white matter differentiation maintained. No encephalomalacia to suggest chronic cortical infarction. No acute intracranial hemorrhage. Few scattered punctate foci of susceptibility artifact about the right thalamus and bilateral cerebral hemispheres, most consistent with chronic microhemorrhages, likely small vessel related. Note made of a 5 mm focus of susceptibility artifact at the left thalamus, corresponding with previously noted hyperdensity on prior CT. This appears to correspond with calcification/mineralization on phase sequence. No mass lesion, midline shift, or mass effect. Diffuse ventricular prominence related to global parenchymal volume loss without hydrocephalus. No  extra-axial fluid collection. Pituitary gland suprasellar region grossly within normal limits. Midline structures intact. Vascular: Major intracranial vascular flow voids are maintained. Skull and upper cervical spine: Craniocervical junction within normal limits. Bone marrow signal intensity grossly normal. No visible scalp soft tissue abnormality. Sinuses/Orbits: Globes and orbital soft tissues demonstrate no acute finding. Moderate mucosal thickening noted throughout the ethmoidal air cells and maxillary sinuses. Superimposed air-fluid levels within the maxillary sinuses suggest acute sinusitis, right greater than left. No mastoid effusion. Other: None. MRA HEAD FINDINGS ANTERIOR CIRCULATION: Visualized distal cervical segments of the internal carotid arteries are patent with antegrade flow. Petrous segments patent bilaterally. Short-segment mild stenosis noted at the proximal cavernous right ICA (series 2, image 102). Cavernous and supraclinoid ICAs otherwise widely patent without hemodynamically significant stenosis. A1 segments patent bilaterally. Normal anterior communicating artery complex. Anterior cerebral arteries widely patent to their distal aspects without stenosis. No M1 stenosis or occlusion. Normal MCA bifurcations. Distal MCA branches well perfused and symmetric. POSTERIOR CIRCULATION: Vertebral arteries widely patent to the vertebrobasilar junction without stenosis. Left vertebral artery dominant. Partially visualized left PICA appears patent. Right PICA not seen. Basilar widely patent to its distal aspect without stenosis. Superior cerebral arteries patent bilaterally. Both PCAs primarily supplied via the basilar well perfused to their distal aspects. No intracranial aneurysm. MRA NECK FINDINGS AORTIC ARCH: Visualized aortic arch of normal caliber with normal 3 vessel morphology. No hemodynamically significant stenosis seen about the origin of the great vessels. RIGHT CAROTID SYSTEM: Right CCA  widely patent from its origin to the bifurcation without stenosis. Mild  atheromatous irregularity about the right bifurcation/proximal right ICA without hemodynamically significant stenosis. Right ICA widely patent distally to the skull base without stenosis or occlusion. LEFT CAROTID SYSTEM: Left common carotid artery widely patent from its origin to the bifurcation without stenosis. No significant atheromatous narrowing or irregularity about the left bifurcation. Left ICA widely patent distally to the skull base without stenosis or occlusion. VERTEBRAL ARTERIES: Both vertebral arteries arise from the subclavian arteries. No proximal subclavian artery stenosis. Left vertebral artery dominant. Both vertebral arteries widely patent within the neck without stenosis or occlusion. IMPRESSION: MRI HEAD IMPRESSION: 1. No acute intracranial abnormality. 2. Moderately advanced age-related cerebral atrophy with chronic microvascular ischemic disease, with a few scattered remote lacunar infarcts about the bilateral basal ganglia, thalami, and hemispheric cerebral white matter. 3. Small focus of susceptibility artifact at the left thalamus, corresponding with hyperdensity seen on prior CT. This is most consistent with a small focus of mineralization/calcification. No acute intracranial hemorrhage. 4. Acute ethmoidal and maxillary sinusitis, right worse than left. MRA HEAD IMPRESSION: Negative intracranial MRA. No large vessel occlusion or hemodynamically significant stenosis. No aneurysm. MRA NECK IMPRESSION: Negative MRA of the neck. No hemodynamically significant or critical flow limiting stenosis about either carotid artery system. Both vertebral arteries widely patent within the neck. Electronically Signed   By: Jeannine Boga M.D.   On: 11/08/2019 22:10   MR ANGIO NECK W WO CONTRAST  Result Date: 10/23/2019 CLINICAL DATA:  Initial evaluation for acute stroke, right-sided weakness. EXAM: MRI HEAD WITHOUT CONTRAST  MRA HEAD WITHOUT CONTRAST MRA NECK WITHOUT AND WITH CONTRAST TECHNIQUE: Multiplanar, multiecho pulse sequences of the brain and surrounding structures were obtained without intravenous contrast. Angiographic images of the Circle of Willis were obtained using MRA technique without intravenous contrast. Angiographic images of the neck were obtained using MRA technique without and with intravenous contrast. Carotid stenosis measurements (when applicable) are obtained utilizing NASCET criteria, using the distal internal carotid diameter as the denominator. CONTRAST:  8m GADAVIST GADOBUTROL 1 MMOL/ML IV SOLN COMPARISON:  Comparison made with prior head CT from earlier the same day. FINDINGS: MRI HEAD FINDINGS Brain: Examination moderately degraded by motion artifact. Diffuse prominence of the CSF containing spaces compatible with generalized age-related cerebral atrophy, moderately advanced for age. Patchy and confluent T2/FLAIR hyperintensity within the periventricular and deep white matter both cerebral hemispheres most consistent with chronic small vessel ischemic disease, moderate in nature. Patchy involvement of the pons noted. Multiple scattered remote lacunar infarcts present about the bilateral basal ganglia, thalami, and hemispheric cerebral white matter. No abnormal foci of restricted diffusion to suggest acute or subacute ischemia. Gray-white matter differentiation maintained. No encephalomalacia to suggest chronic cortical infarction. No acute intracranial hemorrhage. Few scattered punctate foci of susceptibility artifact about the right thalamus and bilateral cerebral hemispheres, most consistent with chronic microhemorrhages, likely small vessel related. Note made of a 5 mm focus of susceptibility artifact at the left thalamus, corresponding with previously noted hyperdensity on prior CT. This appears to correspond with calcification/mineralization on phase sequence. No mass lesion, midline shift, or mass  effect. Diffuse ventricular prominence related to global parenchymal volume loss without hydrocephalus. No extra-axial fluid collection. Pituitary gland suprasellar region grossly within normal limits. Midline structures intact. Vascular: Major intracranial vascular flow voids are maintained. Skull and upper cervical spine: Craniocervical junction within normal limits. Bone marrow signal intensity grossly normal. No visible scalp soft tissue abnormality. Sinuses/Orbits: Globes and orbital soft tissues demonstrate no acute finding. Moderate mucosal thickening noted throughout the ethmoidal  air cells and maxillary sinuses. Superimposed air-fluid levels within the maxillary sinuses suggest acute sinusitis, right greater than left. No mastoid effusion. Other: None. MRA HEAD FINDINGS ANTERIOR CIRCULATION: Visualized distal cervical segments of the internal carotid arteries are patent with antegrade flow. Petrous segments patent bilaterally. Short-segment mild stenosis noted at the proximal cavernous right ICA (series 2, image 102). Cavernous and supraclinoid ICAs otherwise widely patent without hemodynamically significant stenosis. A1 segments patent bilaterally. Normal anterior communicating artery complex. Anterior cerebral arteries widely patent to their distal aspects without stenosis. No M1 stenosis or occlusion. Normal MCA bifurcations. Distal MCA branches well perfused and symmetric. POSTERIOR CIRCULATION: Vertebral arteries widely patent to the vertebrobasilar junction without stenosis. Left vertebral artery dominant. Partially visualized left PICA appears patent. Right PICA not seen. Basilar widely patent to its distal aspect without stenosis. Superior cerebral arteries patent bilaterally. Both PCAs primarily supplied via the basilar well perfused to their distal aspects. No intracranial aneurysm. MRA NECK FINDINGS AORTIC ARCH: Visualized aortic arch of normal caliber with normal 3 vessel morphology. No  hemodynamically significant stenosis seen about the origin of the great vessels. RIGHT CAROTID SYSTEM: Right CCA widely patent from its origin to the bifurcation without stenosis. Mild atheromatous irregularity about the right bifurcation/proximal right ICA without hemodynamically significant stenosis. Right ICA widely patent distally to the skull base without stenosis or occlusion. LEFT CAROTID SYSTEM: Left common carotid artery widely patent from its origin to the bifurcation without stenosis. No significant atheromatous narrowing or irregularity about the left bifurcation. Left ICA widely patent distally to the skull base without stenosis or occlusion. VERTEBRAL ARTERIES: Both vertebral arteries arise from the subclavian arteries. No proximal subclavian artery stenosis. Left vertebral artery dominant. Both vertebral arteries widely patent within the neck without stenosis or occlusion. IMPRESSION: MRI HEAD IMPRESSION: 1. No acute intracranial abnormality. 2. Moderately advanced age-related cerebral atrophy with chronic microvascular ischemic disease, with a few scattered remote lacunar infarcts about the bilateral basal ganglia, thalami, and hemispheric cerebral white matter. 3. Small focus of susceptibility artifact at the left thalamus, corresponding with hyperdensity seen on prior CT. This is most consistent with a small focus of mineralization/calcification. No acute intracranial hemorrhage. 4. Acute ethmoidal and maxillary sinusitis, right worse than left. MRA HEAD IMPRESSION: Negative intracranial MRA. No large vessel occlusion or hemodynamically significant stenosis. No aneurysm. MRA NECK IMPRESSION: Negative MRA of the neck. No hemodynamically significant or critical flow limiting stenosis about either carotid artery system. Both vertebral arteries widely patent within the neck. Electronically Signed   By: Jeannine Boga M.D.   On: 11/04/2019 22:10   MR BRAIN WO CONTRAST  Result Date:  10/16/2019 CLINICAL DATA:  Initial evaluation for acute stroke, right-sided weakness. EXAM: MRI HEAD WITHOUT CONTRAST MRA HEAD WITHOUT CONTRAST MRA NECK WITHOUT AND WITH CONTRAST TECHNIQUE: Multiplanar, multiecho pulse sequences of the brain and surrounding structures were obtained without intravenous contrast. Angiographic images of the Circle of Willis were obtained using MRA technique without intravenous contrast. Angiographic images of the neck were obtained using MRA technique without and with intravenous contrast. Carotid stenosis measurements (when applicable) are obtained utilizing NASCET criteria, using the distal internal carotid diameter as the denominator. CONTRAST:  73m GADAVIST GADOBUTROL 1 MMOL/ML IV SOLN COMPARISON:  Comparison made with prior head CT from earlier the same day. FINDINGS: MRI HEAD FINDINGS Brain: Examination moderately degraded by motion artifact. Diffuse prominence of the CSF containing spaces compatible with generalized age-related cerebral atrophy, moderately advanced for age. Patchy and confluent T2/FLAIR hyperintensity within the  periventricular and deep white matter both cerebral hemispheres most consistent with chronic small vessel ischemic disease, moderate in nature. Patchy involvement of the pons noted. Multiple scattered remote lacunar infarcts present about the bilateral basal ganglia, thalami, and hemispheric cerebral white matter. No abnormal foci of restricted diffusion to suggest acute or subacute ischemia. Gray-white matter differentiation maintained. No encephalomalacia to suggest chronic cortical infarction. No acute intracranial hemorrhage. Few scattered punctate foci of susceptibility artifact about the right thalamus and bilateral cerebral hemispheres, most consistent with chronic microhemorrhages, likely small vessel related. Note made of a 5 mm focus of susceptibility artifact at the left thalamus, corresponding with previously noted hyperdensity on prior CT.  This appears to correspond with calcification/mineralization on phase sequence. No mass lesion, midline shift, or mass effect. Diffuse ventricular prominence related to global parenchymal volume loss without hydrocephalus. No extra-axial fluid collection. Pituitary gland suprasellar region grossly within normal limits. Midline structures intact. Vascular: Major intracranial vascular flow voids are maintained. Skull and upper cervical spine: Craniocervical junction within normal limits. Bone marrow signal intensity grossly normal. No visible scalp soft tissue abnormality. Sinuses/Orbits: Globes and orbital soft tissues demonstrate no acute finding. Moderate mucosal thickening noted throughout the ethmoidal air cells and maxillary sinuses. Superimposed air-fluid levels within the maxillary sinuses suggest acute sinusitis, right greater than left. No mastoid effusion. Other: None. MRA HEAD FINDINGS ANTERIOR CIRCULATION: Visualized distal cervical segments of the internal carotid arteries are patent with antegrade flow. Petrous segments patent bilaterally. Short-segment mild stenosis noted at the proximal cavernous right ICA (series 2, image 102). Cavernous and supraclinoid ICAs otherwise widely patent without hemodynamically significant stenosis. A1 segments patent bilaterally. Normal anterior communicating artery complex. Anterior cerebral arteries widely patent to their distal aspects without stenosis. No M1 stenosis or occlusion. Normal MCA bifurcations. Distal MCA branches well perfused and symmetric. POSTERIOR CIRCULATION: Vertebral arteries widely patent to the vertebrobasilar junction without stenosis. Left vertebral artery dominant. Partially visualized left PICA appears patent. Right PICA not seen. Basilar widely patent to its distal aspect without stenosis. Superior cerebral arteries patent bilaterally. Both PCAs primarily supplied via the basilar well perfused to their distal aspects. No intracranial  aneurysm. MRA NECK FINDINGS AORTIC ARCH: Visualized aortic arch of normal caliber with normal 3 vessel morphology. No hemodynamically significant stenosis seen about the origin of the great vessels. RIGHT CAROTID SYSTEM: Right CCA widely patent from its origin to the bifurcation without stenosis. Mild atheromatous irregularity about the right bifurcation/proximal right ICA without hemodynamically significant stenosis. Right ICA widely patent distally to the skull base without stenosis or occlusion. LEFT CAROTID SYSTEM: Left common carotid artery widely patent from its origin to the bifurcation without stenosis. No significant atheromatous narrowing or irregularity about the left bifurcation. Left ICA widely patent distally to the skull base without stenosis or occlusion. VERTEBRAL ARTERIES: Both vertebral arteries arise from the subclavian arteries. No proximal subclavian artery stenosis. Left vertebral artery dominant. Both vertebral arteries widely patent within the neck without stenosis or occlusion. IMPRESSION: MRI HEAD IMPRESSION: 1. No acute intracranial abnormality. 2. Moderately advanced age-related cerebral atrophy with chronic microvascular ischemic disease, with a few scattered remote lacunar infarcts about the bilateral basal ganglia, thalami, and hemispheric cerebral white matter. 3. Small focus of susceptibility artifact at the left thalamus, corresponding with hyperdensity seen on prior CT. This is most consistent with a small focus of mineralization/calcification. No acute intracranial hemorrhage. 4. Acute ethmoidal and maxillary sinusitis, right worse than left. MRA HEAD IMPRESSION: Negative intracranial MRA. No large vessel occlusion or  hemodynamically significant stenosis. No aneurysm. MRA NECK IMPRESSION: Negative MRA of the neck. No hemodynamically significant or critical flow limiting stenosis about either carotid artery system. Both vertebral arteries widely patent within the neck.  Electronically Signed   By: Jeannine Boga M.D.   On: 11/04/2019 22:10   DG Chest Portable 1 View  Result Date: 10/27/2019 CLINICAL DATA:  COVID positive. EXAM: PORTABLE CHEST 1 VIEW COMPARISON:  October 24, 2019 FINDINGS: Mild infiltrates are seen along the periphery of the right lung. This is increased in severity when compared to the prior study. There is no evidence of a pleural effusion or pneumothorax. The heart size and mediastinal contours are within normal limits. The visualized skeletal structures are unremarkable. IMPRESSION: Mild right lung infiltrates, increased in severity when compared to the prior study. Electronically Signed   By: Virgina Norfolk M.D.   On: 10/20/2019 18:02   CT HEAD CODE STROKE WO CONTRAST  Addendum Date: 10/16/2019   ADDENDUM REPORT: 10/31/2019 17:44 ADDENDUM: These results were called by telephone at the time of interpretation on 11/09/2019 at 5:38 pm to provider Dr. Curly Shores, who verbally acknowledged these results. Electronically Signed   By: Kellie Simmering DO   On: 11/10/2019 17:44   Result Date: 10/17/2019 CLINICAL DATA:  Code stroke. Neuro deficit, acute, stroke suspected. EXAM: CT HEAD WITHOUT CONTRAST TECHNIQUE: Contiguous axial images were obtained from the base of the skull through the vertex without intravenous contrast. COMPARISON:  Noncontrast head CT 10/24/2019 FINDINGS: Brain: Stable, moderate generalized parenchymal atrophy. As before, there is advanced ill-defined hypoattenuation within the cerebral white matter which is nonspecific, but consistent with chronic small vessel ischemic disease. Redemonstrated multiple chronic lacunar infarcts within the bilateral basal ganglia and left thalamus. New from the prior head CT of 10/24/2019, there is a 5 mm focus of hyperdensity within the left thalamocapsular junction at site of a chronic lacunar infarct which may reflect a small parenchymal hemorrhage or mineralization (series 3, image 16) (series 5,  image 43). No demarcated cortical infarct is identified. No extra-axial fluid collection. No evidence of intracranial mass. No midline shift. Vascular: No definite hyperdense vessel. Atherosclerotic calcifications Skull: Normal. Negative for fracture or focal lesion. Sinuses/Orbits: Visualized orbits show no acute finding. Extensive partial opacification of bilateral ethmoid air cells. Frothy secretions and small air-fluid level within the left maxillary sinus. Near complete opacification of the right maxillary sinus. Mild-to-moderate mucosal thickening within the sphenoid sinuses. No significant mastoid effusion. ASPECTS ([place] Stroke Program Early CT Score) - Ganglionic level infarction (caudate, lentiform nuclei, internal capsule, insula, M1-M3 cortex): 7 - Supraganglionic infarction (M4-M6 cortex): 3 Total score (0-10 with 10 being normal): 10 IMPRESSION: New from the prior head CT of 10/24/2019, there is a 5 mm focus of hyperdensity within the left thalamocapsular junction (at site of a chronic lacunar infarct) which may reflect parenchymal hemorrhage or mineralization. No acute demarcated cortical infarct is identified. Unchanged moderate generalized parenchymal atrophy and advanced cerebral white matter chronic small vessel ischemic disease. Redemonstrated chronic lacunar infarcts within the bilateral basal ganglia and left thalamus. Paranasal sinus disease, most notably ethmoid and maxillary sinusitis. Electronically Signed: By: Kellie Simmering DO On: 11/03/2019 17:37    Procedures Procedures (including critical care time)  Medications Ordered in ED Medications  albuterol (VENTOLIN HFA) 108 (90 Base) MCG/ACT inhaler 2 puff (has no administration in time range)  acetaminophen (TYLENOL) tablet 650 mg (650 mg Oral Given 11/03/19 3664)    Or  acetaminophen (TYLENOL) 160 MG/5ML solution 650 mg (  Per Tube See Alternative 11/03/19 0628)    Or  acetaminophen (TYLENOL) suppository 650 mg ( Rectal See  Alternative 11/03/19 0628)  pantoprazole (PROTONIX) injection 40 mg (40 mg Intravenous Given 11/03/19 0004)  0.9 %  sodium chloride infusion ( Intravenous New Bag/Given 11/03/19 0935)  guaiFENesin-dextromethorphan (ROBITUSSIN DM) 100-10 MG/5ML syrup 10 mL (has no administration in time range)  chlorpheniramine-HYDROcodone (TUSSIONEX) 10-8 MG/5ML suspension 5 mL (has no administration in time range)  ascorbic acid (VITAMIN C) tablet 500 mg (500 mg Oral Given 11/03/19 1034)  zinc sulfate capsule 220 mg (220 mg Oral Given 11/03/19 1034)  baricitinib (OLUMIANT) tablet 2 mg (2 mg Oral Given 11/03/19 1034)  methylPREDNISolone sodium succinate (SOLU-MEDROL) 125 mg/2 mL injection 80 mg (80 mg Intravenous Given 11/03/19 1034)    Followed by  predniSONE (DELTASONE) tablet 50 mg (has no administration in time range)  cefTRIAXone (ROCEPHIN) 1 g in sodium chloride 0.9 % 100 mL IVPB (0 g Intravenous Stopped 10/29/2019 1937)  azithromycin (ZITHROMAX) 500 mg in sodium chloride 0.9 % 250 mL IVPB (0 mg Intravenous Stopped 11/09/2019 2221)  gadobutrol (GADAVIST) 1 MMOL/ML injection 8 mL (8 mLs Intravenous Contrast Given 10/18/2019 2109)   stroke: mapping our early stages of recovery book ( Does not apply Given 11/08/2019 2354)  potassium chloride SA (KLOR-CON) CR tablet 40 mEq (40 mEq Oral Given 11/03/19 1448)    ED Course  I have reviewed the triage vital signs and the nursing notes.  Pertinent labs & imaging results that were available during my care of the patient were reviewed by me and considered in my medical decision making (see chart for details).    MDM Rules/Calculators/A&P                          Pt taken directly to CT.  He has a small thalamic hemorrhage, so tpa is not indicated.   Pt is mildly hypoxic (RA O2 sat 90%), so he is put on 2L oxygen via [place].  O2 sat up to the mid-90s.  He is Covid + and has a worsening pna on CXR.  Due to this, he was put on rocephin/zithromax.  Inflammatory markers have been ordered.   He's already had 5 days of Remdesivir.  Pt d/w Dr. Curly Shores (neurology).  She will admit to the ICU for q 1 hr neuro checks.  MRI brain and MRA brain/neck has been ordered by Dr. Curly Shores.    CRITICAL CARE Performed by: Isla Pence   Total critical care time: 30 minutes  Critical care time was exclusive of separately billable procedures and treating other patients.  Critical care was necessary to treat or prevent imminent or life-threatening deterioration.  Critical care was time spent personally by me on the following activities: development of treatment plan with patient and/or surrogate as well as nursing, discussions with consultants, evaluation of patient's response to treatment, examination of patient, obtaining history from patient or surrogate, ordering and performing treatments and interventions, ordering and review of laboratory studies, ordering and review of radiographic studies, pulse oximetry and re-evaluation of patient's condition.  Norbert Malkin was evaluated in Emergency Department on 11/03/2019 for the symptoms described in the history of present illness. He was evaluated in the context of the global COVID-19 pandemic, which necessitated consideration that the patient might be at risk for infection with the SARS-CoV-2 virus that causes COVID-19. Institutional protocols and algorithms that pertain to the evaluation of patients at risk for COVID-19 are in  a state of rapid change based on information released by regulatory bodies including the CDC and federal and state organizations. These policies and algorithms were followed during the patient's care in the ED.   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

## 2019-11-03 DIAGNOSIS — Z8673 Personal history of transient ischemic attack (TIA), and cerebral infarction without residual deficits: Secondary | ICD-10-CM

## 2019-11-03 DIAGNOSIS — J9601 Acute respiratory failure with hypoxia: Secondary | ICD-10-CM | POA: Diagnosis present

## 2019-11-03 DIAGNOSIS — G9341 Metabolic encephalopathy: Secondary | ICD-10-CM | POA: Diagnosis present

## 2019-11-03 LAB — MAGNESIUM: Magnesium: 2.2 mg/dL (ref 1.7–2.4)

## 2019-11-03 LAB — COMPREHENSIVE METABOLIC PANEL
ALT: 31 U/L (ref 0–44)
ALT: 36 U/L (ref 0–44)
AST: 22 U/L (ref 15–41)
AST: 23 U/L (ref 15–41)
Albumin: 2.1 g/dL — ABNORMAL LOW (ref 3.5–5.0)
Albumin: 2.5 g/dL — ABNORMAL LOW (ref 3.5–5.0)
Alkaline Phosphatase: 56 U/L (ref 38–126)
Alkaline Phosphatase: 73 U/L (ref 38–126)
Anion gap: 11 (ref 5–15)
Anion gap: 15 (ref 5–15)
BUN: 23 mg/dL (ref 8–23)
BUN: 27 mg/dL — ABNORMAL HIGH (ref 8–23)
CO2: 20 mmol/L — ABNORMAL LOW (ref 22–32)
CO2: 19 mmol/L — ABNORMAL LOW (ref 22–32)
Calcium: 6.7 mg/dL — ABNORMAL LOW (ref 8.9–10.3)
Calcium: 8 mg/dL — ABNORMAL LOW (ref 8.9–10.3)
Chloride: 112 mmol/L — ABNORMAL HIGH (ref 98–111)
Chloride: 109 mmol/L (ref 98–111)
Creatinine, Ser: 1.69 mg/dL — ABNORMAL HIGH (ref 0.61–1.24)
Creatinine, Ser: 1.97 mg/dL — ABNORMAL HIGH (ref 0.61–1.24)
GFR calc Af Amer: 46 mL/min — ABNORMAL LOW (ref >60)
GFR calc Af Amer: 38 mL/min — ABNORMAL LOW (ref >60)
GFR calc non Af Amer: 39 mL/min — ABNORMAL LOW (ref >60)
GFR calc non Af Amer: 33 mL/min — ABNORMAL LOW (ref >60)
Glucose, Bld: 138 mg/dL — ABNORMAL HIGH (ref 70–99)
Glucose, Bld: 184 mg/dL — ABNORMAL HIGH (ref 70–99)
Potassium: 3.4 mmol/L — ABNORMAL LOW (ref 3.5–5.1)
Potassium: 4.7 mmol/L (ref 3.5–5.1)
Sodium: 143 mmol/L (ref 135–145)
Sodium: 143 mmol/L (ref 135–145)
Total Bilirubin: 1.6 mg/dL — ABNORMAL HIGH (ref 0.3–1.2)
Total Bilirubin: 1.7 mg/dL — ABNORMAL HIGH (ref 0.3–1.2)
Total Protein: 4.9 g/dL — ABNORMAL LOW (ref 6.5–8.1)
Total Protein: 6.2 g/dL — ABNORMAL LOW (ref 6.5–8.1)

## 2019-11-03 LAB — FERRITIN: Ferritin: 433 ng/mL — ABNORMAL HIGH (ref 24–336)

## 2019-11-03 LAB — CBC
HCT: 43.5 % (ref 39.0–52.0)
Hemoglobin: 14.4 g/dL (ref 13.0–17.0)
MCH: 31.6 pg (ref 26.0–34.0)
MCHC: 33.1 g/dL (ref 30.0–36.0)
MCV: 95.4 fL (ref 80.0–100.0)
Platelets: 122 10*3/uL — ABNORMAL LOW (ref 150–400)
RBC: 4.56 MIL/uL (ref 4.22–5.81)
RDW: 14.6 % (ref 11.5–15.5)
WBC: 14.4 10*3/uL — ABNORMAL HIGH (ref 4.0–10.5)
nRBC: 0 % (ref 0.0–0.2)

## 2019-11-03 LAB — LACTATE DEHYDROGENASE
LDH: 348 U/L — ABNORMAL HIGH (ref 98–192)
LDH: 307 U/L — ABNORMAL HIGH (ref 98–192)

## 2019-11-03 LAB — TROPONIN I (HIGH SENSITIVITY): Troponin I (High Sensitivity): 16 ng/L (ref <18)

## 2019-11-03 LAB — FIBRINOGEN: Fibrinogen: 730 mg/dL — ABNORMAL HIGH (ref 210–475)

## 2019-11-03 LAB — TRIGLYCERIDES: Triglycerides: 75 mg/dL (ref <150)

## 2019-11-03 LAB — C-REACTIVE PROTEIN: CRP: 11.2 mg/dL — ABNORMAL HIGH (ref <1.0)

## 2019-11-03 LAB — BRAIN NATRIURETIC PEPTIDE: B Natriuretic Peptide: 1685.2 pg/mL — ABNORMAL HIGH (ref 0.0–100.0)

## 2019-11-03 LAB — PROCALCITONIN: Procalcitonin: 0.44 ng/mL

## 2019-11-03 LAB — D-DIMER, QUANTITATIVE: D-Dimer, Quant: 0.9 ug/mL-FEU — ABNORMAL HIGH (ref 0.00–0.50)

## 2019-11-03 MED ORDER — PREDNISONE 50 MG PO TABS
50.0000 mg | ORAL_TABLET | Freq: Every day | ORAL | Status: DC
  Administered 2019-11-06: 50 mg via ORAL
  Filled 2019-11-03 (×2): qty 1

## 2019-11-03 MED ORDER — ASCORBIC ACID 500 MG PO TABS
500.0000 mg | ORAL_TABLET | Freq: Every day | ORAL | Status: DC
  Administered 2019-11-03 – 2019-11-13 (×11): 500 mg via ORAL
  Filled 2019-11-03 (×12): qty 1

## 2019-11-03 MED ORDER — BARICITINIB 2 MG PO TABS
2.0000 mg | ORAL_TABLET | Freq: Every day | ORAL | Status: DC
  Administered 2019-11-03 – 2019-11-04 (×2): 2 mg via ORAL
  Filled 2019-11-03 (×2): qty 1

## 2019-11-03 MED ORDER — HYDROCOD POLST-CPM POLST ER 10-8 MG/5ML PO SUER
5.0000 mL | Freq: Two times a day (BID) | ORAL | Status: DC | PRN
  Administered 2019-11-06 – 2019-11-09 (×4): 5 mL via ORAL
  Filled 2019-11-03 (×4): qty 5

## 2019-11-03 MED ORDER — METHYLPREDNISOLONE SODIUM SUCC 125 MG IJ SOLR
80.0000 mg | Freq: Two times a day (BID) | INTRAMUSCULAR | Status: AC
  Administered 2019-11-03 – 2019-11-05 (×6): 80 mg via INTRAVENOUS
  Filled 2019-11-03 (×6): qty 2

## 2019-11-03 MED ORDER — SODIUM CHLORIDE 0.9 % IV SOLN: INTRAVENOUS | Status: DC

## 2019-11-03 MED ORDER — GUAIFENESIN-DM 100-10 MG/5ML PO SYRP
10.0000 mL | ORAL_SOLUTION | ORAL | Status: DC | PRN
  Administered 2019-11-06 – 2019-11-08 (×3): 10 mL via ORAL
  Filled 2019-11-03 (×4): qty 10

## 2019-11-03 MED ORDER — POTASSIUM CHLORIDE CRYS ER 20 MEQ PO TBCR
40.0000 meq | EXTENDED_RELEASE_TABLET | Freq: Once | ORAL | Status: AC
  Administered 2019-11-03: 40 meq via ORAL
  Filled 2019-11-03: qty 2

## 2019-11-03 MED ORDER — ZINC SULFATE 220 (50 ZN) MG PO CAPS
220.0000 mg | ORAL_CAPSULE | Freq: Every day | ORAL | Status: DC
  Administered 2019-11-03 – 2019-11-13 (×11): 220 mg via ORAL
  Filled 2019-11-03 (×12): qty 1

## 2019-11-03 NOTE — ED Notes (Signed)
Attempted to draw labs on pt, pt refused labs. Notified Dr. Jacqulyn Bath of same. Will attempt to draw again at later time.

## 2019-11-03 NOTE — ED Notes (Signed)
Attempted to once again obtain blood to draw off pt's IV. Upon entering the room pt stuck hand up and exclaimed, "NO! No more blood!" and refused to have RN draw labs. This RN tried to explain importance of labs, pt still refused. MD notified and aware of this.

## 2019-11-03 NOTE — Consult Note (Signed)
Medical Consultation   Robert Buckley  ZOX:[id number]  DOB: [birth year]  DOA: 2019/08/21  PCP: Shelda JakesPatel, Rakesh, PA-C   Requesting physician: Dr. Roda ShuttersXu  Reason for consultation: COVID-19 infection   History of Present Illness: Robert Buckley is an 74 y.o. male with past medical history of hypertension, CVA with residual right-sided weakness, CKD stage IIIa, obese, hyperlipidemia, recent COVID-19 infection presents to emergency department with right arm weakness.   Patient presented to ER with confusion and worsening right-sided weakness at 3 PM.  EMS noted patient's oxygen saturation 90% was placed on 2 L of oxygen via nasal cannula.  Code stroke was activated and patient was evaluated by neurology.  Initial CT concerning for thalamus hemorrhage however MRI brain came back negative shows findings most consistent with small focus of mineralization/calcification.  No intracranial hemorrhage.  MRA head and neck came back negative for acute findings.  Initially his confusion was thought to be related to ICH.   Upon my evaluation: Patient resting comfortably on the bed, on 2 L oxygen, alert however oriented to place only.  He is following commands.  He denies headache, blurry vision, chest pain or shortness of breath, vomiting, abdominal pain, diarrhea, urinary symptoms.  He denies alcohol, smoking, illicit drug use.  Of note: Patient admitted on 9/13 with acute hypoxemic respiratory failure secondary to COVID-19 pneumonia and discharged home in stable condition on 9/15.  He received 3 out of 5 doses of remdesivir while he was hospitalized and received 2 remaining doses outpatient.  He discharged home on 10 days of steroids.  Upon arrival to ED: Patient had fever of 101.2, tachypneic, hypoxic requiring 2 L of oxygen via nasal cannula, lactic acid: 2.1, WBC: 17,000, PCT: 0.44, D-dimer: 1.08, UA positive for rare bacteria.  CMP shows worsening kidney function.  Chest x-ray shows mild right  lung infiltrate increased when compared to prior study.  Patient received Rocephin and azithromycin in ED.  [facility] hospitalist consulted for acute hypoxemic respiratory failure due to COVID-19 pneumonia.  Past Medical History:  Diagnosis Date  . Arthritis   . Hypertension   . Stroke [facility](HCC) 2008   residual R sided weakness         Review of Systems:  ROS As per HPI otherwise 10 point review of systems negative.     Past Medical History: Past Medical History:  Diagnosis Date  . Arthritis   . Hypertension   . Stroke [facility]HCC) 2008   residual R sided weakness    Past Surgical History: No past surgical history on file.   Allergies:   Allergies  Allergen Reactions  . Sulfa Antibiotics Rash     Social History:  reports that he has never smoked. He has never used smokeless tobacco. He reports that he does not drink alcohol and does not use drugs.   Family History: Family History  Problem Relation Age of Onset  . Diabetes Mother       Physical Exam: Vitals:   11/03/19 0715 11/03/19 0730 11/03/19 0800 11/03/19 0814  BP: 109/83 120/80 (!) 154/92   Pulse: 92 83 97   Resp: (!) 28 (!) 29 (!) 45   Temp:    98.9 F (37.2 C)  TempSrc:    Oral  SpO2: 94% 95% 91%     Constitutional: Appearance,  Alert and awake, following commands, oriented to place only.  Appears weak and dehydrated. Eyes: PERLA, EOMI, irises appear normal,  anicteric sclera,  ENMT: external ears and nose appear normal, normal hearing or hard of hearing            Lips appears normal, oropharynx mucosa, tongue, posterior pharynx appear normal  Neck: neck appears normal, no masses, normal ROM, no thyromegaly, no JVD  CVS: S1-S2 clear, no murmur rubs or gallops, no LE edema, normal pedal pulses  Respiratory:  clear to auscultation bilaterally, no wheezing, rales or rhonchi. Respiratory effort normal. No accessory muscle use.  Abdomen: soft nontender, nondistended, normal bowel sounds, no  hepatosplenomegaly, no hernias  Musculoskeletal: : no cyanosis, clubbing or edema noted bilaterally                       Joint/bones/muscle exam, strength, contractures or atrophy Neuro: Cranial nerves II-XII intact, sensation intact.  Power 1 out of 5 in right upper and lower extremity, 3 out of 5 in left upper extremity and 1 out of 5 in left lower extremity.   Skin: no rashes or lesions or ulcers, no induration or nodules    Data reviewed:  I have personally reviewed following labs and imaging studies Labs:  CBC: Recent Labs  Lab 10/27/2019 1711 11/08/2019 1724  WBC 17.0*  --   NEUTROABS 14.9*  --   HGB 16.0 15.6  HCT 47.7 46.0  MCV 91.7  --   PLT 179  --     Basic Metabolic Panel: Recent Labs  Lab 10/25/2019 1711 10/28/2019 1724  NA 146* 145  K 3.6 3.7  CL 109 110  CO2 22  --   GLUCOSE 153* 156*  BUN 27* 34*  CREATININE 2.07* 1.80*  CALCIUM 8.0*  --    GFR Estimated Creatinine Clearance: 37.4 mL/min (A) (by C-G formula based on SCr of 1.8 mg/dL (H)). Liver Function Tests: Recent Labs  Lab 10/16/2019 1711  AST 29  ALT 36  ALKPHOS 74  BILITOT 2.1*  PROT 6.1*  ALBUMIN 2.8*   No results for input(s): LIPASE, AMYLASE in the last 168 hours. No results for input(s): AMMONIA in the last 168 hours. Coagulation profile Recent Labs  Lab 11/01/2019 1711  INR 1.3*    Cardiac Enzymes: No results for input(s): CKTOTAL, CKMB, CKMBINDEX, TROPONINI in the last 168 hours. BNP: Invalid input(s): POCBNP CBG: Recent Labs  Lab 10/25/2019 1711  GLUCAP 171*   D-Dimer Recent Labs    11/10/2019 2221  DDIMER 1.08*   Hgb A1c No results for input(s): HGBA1C in the last 72 hours. Lipid Profile Recent Labs    11/06/2019 2220  TRIG 75   Thyroid function studies No results for input(s): TSH, T4TOTAL, T3FREE, THYROIDAB in the last 72 hours.  Invalid input(s): FREET3 Anemia work up Recent Labs    10/15/2019 2221  FERRITIN 433*   Urinalysis    Component Value Date/Time    COLORURINE YELLOW 10/22/2019 1845   APPEARANCEUR CLEAR 10/25/2019 1845   LABSPEC 1.013 11/05/2019 1845   PHURINE 5.0 10/29/2019 1845   GLUCOSEU NEGATIVE 10/29/2019 1845   HGBUR SMALL (A) 10/17/2019 1845   BILIRUBINUR NEGATIVE 10/14/2019 1845   KETONESUR NEGATIVE 10/27/2019 1845   PROTEINUR 100 (A) 10/15/2019 1845   NITRITE NEGATIVE 10/30/2019 1845   LEUKOCYTESUR NEGATIVE 10/15/2019 1845     Microbiology Recent Results (from the past 240 hour(s))  SARS Coronavirus 2 by RT PCR (hospital order, performed in [facility] hospital lab) Nasopharyngeal Nasopharyngeal Swab     Status: Abnormal   Collection Time: 10/24/19 10:01 PM  Specimen: Nasopharyngeal Swab  Result Value Ref Range Status   SARS Coronavirus 2 POSITIVE (A) NEGATIVE Final    Comment: RESULT CALLED TO, READ BACK BY AND VERIFIED WITH: RENEE BEAUREGARD 10/25/19 AT 0006 HS (NOTE) SARS-CoV-2 target nucleic acids are DETECTED  SARS-CoV-2 RNA is generally detectable in upper respiratory specimens  during the acute phase of infection.  Positive results are indicative  of the presence of the identified virus, but do not rule out bacterial infection or co-infection with other pathogens not detected by the test.  Clinical correlation with patient history and  other diagnostic information is necessary to determine patient infection status.  The expected result is negative.  Fact Sheet for Patients:   BoilerBrush.com.cy   Fact Sheet for Healthcare Providers:   https://pope.com/    This test is not yet approved or cleared by the [place] FDA and  has been authorized for detection and/or diagnosis of SARS-CoV-2 by FDA under an Emergency Use Authorization (EUA).  This EUA will remain in effect (meaning this t est can be used) for the duration of  the COVID-19 declaration under Section 564(b)(1) of the Act, 21 U.S.C. section 360-bbb-3(b)(1), unless the authorization  is terminated or revoked sooner.  Performed at [facility], [address], [place], [place] [address]   Blood Culture (routine x 2)     Status: None   Collection Time: 10/24/19 10:36 PM   Specimen: BLOOD  Result Value Ref Range Status   Specimen Description   Final    BLOOD LEFT HAND Performed at [facility], [address] [place], [place] [address]    Special Requests   Final    BOTTLES DRAWN AEROBIC AND ANAEROBIC Blood Culture results may not be optimal due to an inadequate volume of blood received in culture bottles Performed at [facility], [address] [place], [place] [address]    Culture  Setup Time   Final    AEROBIC BOTTLE ONLY Organism ID to follow GRAM POSITIVE COCCI CRITICAL RESULT CALLED TO, READ BACK BY AND VERIFIED WITH: Rudene Christians 10/25/19 AT 1722 HS Performed at [facility], [address] [place], [place] [address]    Culture   Final    Romie Minus POSITIVE COCCI UNABLE TO FURTHER IDENTIFY ORGANISM NOT VIABLE Performed at [facility], [address] [place], [place] [address]    Report Status 11/09/2019 FINAL  Final  Blood Culture (routine x 2)     Status: None   Collection Time: 10/24/19 10:36 PM   Specimen: BLOOD  Result Value Ref Range Status   Specimen Description BLOOD LEFT HAND  Final   Special Requests   Final    BOTTLES DRAWN AEROBIC AND ANAEROBIC Blood Culture results may not be optimal due to an inadequate volume of blood received in culture bottles   Culture   Final    NO GROWTH 5 DAYS Performed at [facility], [address], [place], [place] [address]    Report Status 10/29/2019 FINAL  Final  Blood Culture ID Panel (Reflexed)     Status: None   Collection Time: 10/24/19 10:36 PM  Result Value Ref Range Status   Enterococcus faecalis NOT DETECTED NOT DETECTED Final   Enterococcus Faecium NOT DETECTED NOT DETECTED Final   Listeria monocytogenes NOT DETECTED NOT DETECTED  Final   Staphylococcus species NOT DETECTED NOT DETECTED Final   Staphylococcus aureus (BCID) NOT DETECTED NOT DETECTED Final   Staphylococcus epidermidis NOT DETECTED NOT  DETECTED Final   Staphylococcus lugdunensis NOT DETECTED NOT DETECTED Final   Streptococcus species NOT DETECTED NOT DETECTED Final   Streptococcus agalactiae NOT DETECTED NOT DETECTED Final   Streptococcus pneumoniae NOT DETECTED NOT DETECTED Final   Streptococcus pyogenes NOT DETECTED NOT DETECTED Final   A.calcoaceticus-baumannii NOT DETECTED NOT DETECTED Final   Bacteroides fragilis NOT DETECTED NOT DETECTED Final   Enterobacterales NOT DETECTED NOT DETECTED Final   Enterobacter cloacae complex NOT DETECTED NOT DETECTED Final   Escherichia coli NOT DETECTED NOT DETECTED Final   Klebsiella aerogenes NOT DETECTED NOT DETECTED Final   Klebsiella oxytoca NOT DETECTED NOT DETECTED Final   Klebsiella pneumoniae NOT DETECTED NOT DETECTED Final   Proteus species NOT DETECTED NOT DETECTED Final   Salmonella species NOT DETECTED NOT DETECTED Final   Serratia marcescens NOT DETECTED NOT DETECTED Final   Haemophilus influenzae NOT DETECTED NOT DETECTED Final   Neisseria meningitidis NOT DETECTED NOT DETECTED Final   Pseudomonas aeruginosa NOT DETECTED NOT DETECTED Final   Stenotrophomonas maltophilia NOT DETECTED NOT DETECTED Final   Candida albicans NOT DETECTED NOT DETECTED Final   Candida auris NOT DETECTED NOT DETECTED Final   Candida glabrata NOT DETECTED NOT DETECTED Final   Candida krusei NOT DETECTED NOT DETECTED Final   Candida parapsilosis NOT DETECTED NOT DETECTED Final   Candida tropicalis NOT DETECTED NOT DETECTED Final   Cryptococcus neoformans/gattii NOT DETECTED NOT DETECTED Final    Comment: Performed at [facility], [address], [place], [place] [address]  Urine culture     Status: Abnormal   Collection Time: 10/25/19  1:34 AM   Specimen: In/Out Cath Urine  Result Value Ref Range Status    Specimen Description   Final    IN/OUT CATH URINE Performed at [facility], [address] [place], [place] [address]    Special Requests   Final    NONE Performed at [facility], [address] [place], [place] [address]    Culture MULTIPLE SPECIES PRESENT, SUGGEST RECOLLECTION (A)  Final   Report Status 10/25/2019 FINAL  Final       Inpatient Medications:   Scheduled Meds: . vitamin C  500 mg Oral Daily  . baricitinib  2 mg Oral Daily  . methylPREDNISolone (SOLU-MEDROL) injection  80 mg Intravenous Q12H   Followed by  . [START ON 11/06/2019] predniSONE  50 mg Oral Daily  . pantoprazole (PROTONIX) IV  40 mg Intravenous QHS  . zinc sulfate  220 mg Oral Daily   Continuous Infusions: . sodium chloride 75 mL/hr at 11/03/19 0935     Radiological Exams on Admission: MR ANGIO HEAD WO CONTRAST  Result Date: 11/04/2019 CLINICAL DATA:  Initial evaluation for acute stroke, right-sided weakness. EXAM: MRI HEAD WITHOUT CONTRAST MRA HEAD WITHOUT CONTRAST MRA NECK WITHOUT AND WITH CONTRAST TECHNIQUE: Multiplanar, multiecho pulse sequences of the brain and surrounding structures were obtained without intravenous contrast. Angiographic images of the Circle of Willis were obtained using MRA technique without intravenous contrast. Angiographic images of the neck were obtained using MRA technique without and with intravenous contrast. Carotid stenosis measurements (when applicable) are obtained utilizing NASCET criteria, using the distal internal carotid diameter as the denominator. CONTRAST:  8mL GADAVIST GADOBUTROL 1 MMOL/ML IV SOLN COMPARISON:  Comparison made with prior head CT from earlier the same day. FINDINGS: MRI HEAD FINDINGS Brain: Examination moderately degraded by motion artifact. Diffuse prominence of the CSF containing spaces compatible with generalized age-related cerebral atrophy, moderately advanced for age.  Patchy and confluent T2/FLAIR hyperintensity within  the periventricular and deep white matter both cerebral hemispheres most consistent with chronic small vessel ischemic disease, moderate in nature. Patchy involvement of the pons noted. Multiple scattered remote lacunar infarcts present about the bilateral basal ganglia, thalami, and hemispheric cerebral white matter. No abnormal foci of restricted diffusion to suggest acute or subacute ischemia. Gray-white matter differentiation maintained. No encephalomalacia to suggest chronic cortical infarction. No acute intracranial hemorrhage. Few scattered punctate foci of susceptibility artifact about the right thalamus and bilateral cerebral hemispheres, most consistent with chronic microhemorrhages, likely small vessel related. Note made of a 5 mm focus of susceptibility artifact at the left thalamus, corresponding with previously noted hyperdensity on prior CT. This appears to correspond with calcification/mineralization on phase sequence. No mass lesion, midline shift, or mass effect. Diffuse ventricular prominence related to global parenchymal volume loss without hydrocephalus. No extra-axial fluid collection. Pituitary gland suprasellar region grossly within normal limits. Midline structures intact. Vascular: Major intracranial vascular flow voids are maintained. Skull and upper cervical spine: Craniocervical junction within normal limits. Bone marrow signal intensity grossly normal. No visible scalp soft tissue abnormality. Sinuses/Orbits: Globes and orbital soft tissues demonstrate no acute finding. Moderate mucosal thickening noted throughout the ethmoidal air cells and maxillary sinuses. Superimposed air-fluid levels within the maxillary sinuses suggest acute sinusitis, right greater than left. No mastoid effusion. Other: None. MRA HEAD FINDINGS ANTERIOR CIRCULATION: Visualized distal cervical segments of the internal carotid arteries are patent with antegrade flow. Petrous segments patent bilaterally.  Short-segment mild stenosis noted at the proximal cavernous right ICA (series 2, image 102). Cavernous and supraclinoid ICAs otherwise widely patent without hemodynamically significant stenosis. A1 segments patent bilaterally. Normal anterior communicating artery complex. Anterior cerebral arteries widely patent to their distal aspects without stenosis. No M1 stenosis or occlusion. Normal MCA bifurcations. Distal MCA branches well perfused and symmetric. POSTERIOR CIRCULATION: Vertebral arteries widely patent to the vertebrobasilar junction without stenosis. Left vertebral artery dominant. Partially visualized left PICA appears patent. Right PICA not seen. Basilar widely patent to its distal aspect without stenosis. Superior cerebral arteries patent bilaterally. Both PCAs primarily supplied via the basilar well perfused to their distal aspects. No intracranial aneurysm. MRA NECK FINDINGS AORTIC ARCH: Visualized aortic arch of normal caliber with normal 3 vessel morphology. No hemodynamically significant stenosis seen about the origin of the great vessels. RIGHT CAROTID SYSTEM: Right CCA widely patent from its origin to the bifurcation without stenosis. Mild atheromatous irregularity about the right bifurcation/proximal right ICA without hemodynamically significant stenosis. Right ICA widely patent distally to the skull base without stenosis or occlusion. LEFT CAROTID SYSTEM: Left common carotid artery widely patent from its origin to the bifurcation without stenosis. No significant atheromatous narrowing or irregularity about the left bifurcation. Left ICA widely patent distally to the skull base without stenosis or occlusion. VERTEBRAL ARTERIES: Both vertebral arteries arise from the subclavian arteries. No proximal subclavian artery stenosis. Left vertebral artery dominant. Both vertebral arteries widely patent within the neck without stenosis or occlusion. IMPRESSION: MRI HEAD IMPRESSION: 1. No acute intracranial  abnormality. 2. Moderately advanced age-related cerebral atrophy with chronic microvascular ischemic disease, with a few scattered remote lacunar infarcts about the bilateral basal ganglia, thalami, and hemispheric cerebral white matter. 3. Small focus of susceptibility artifact at the left thalamus, corresponding with hyperdensity seen on prior CT. This is most consistent with a small focus of mineralization/calcification. No acute intracranial hemorrhage. 4. Acute ethmoidal and maxillary sinusitis, right worse than left. MRA HEAD IMPRESSION:  Negative intracranial MRA. No large vessel occlusion or hemodynamically significant stenosis. No aneurysm. MRA NECK IMPRESSION: Negative MRA of the neck. No hemodynamically significant or critical flow limiting stenosis about either carotid artery system. Both vertebral arteries widely patent within the neck. Electronically Signed   By: Rise Mu M.D.   On: Nov 30, 2019 22:10   MR ANGIO NECK W WO CONTRAST  Result Date: 2019/11/30 CLINICAL DATA:  Initial evaluation for acute stroke, right-sided weakness. EXAM: MRI HEAD WITHOUT CONTRAST MRA HEAD WITHOUT CONTRAST MRA NECK WITHOUT AND WITH CONTRAST TECHNIQUE: Multiplanar, multiecho pulse sequences of the brain and surrounding structures were obtained without intravenous contrast. Angiographic images of the Circle of Willis were obtained using MRA technique without intravenous contrast. Angiographic images of the neck were obtained using MRA technique without and with intravenous contrast. Carotid stenosis measurements (when applicable) are obtained utilizing NASCET criteria, using the distal internal carotid diameter as the denominator. CONTRAST:  8mL GADAVIST GADOBUTROL 1 MMOL/ML IV SOLN COMPARISON:  Comparison made with prior head CT from earlier the same day. FINDINGS: MRI HEAD FINDINGS Brain: Examination moderately degraded by motion artifact. Diffuse prominence of the CSF containing spaces compatible with  generalized age-related cerebral atrophy, moderately advanced for age. Patchy and confluent T2/FLAIR hyperintensity within the periventricular and deep white matter both cerebral hemispheres most consistent with chronic small vessel ischemic disease, moderate in nature. Patchy involvement of the pons noted. Multiple scattered remote lacunar infarcts present about the bilateral basal ganglia, thalami, and hemispheric cerebral white matter. No abnormal foci of restricted diffusion to suggest acute or subacute ischemia. Gray-white matter differentiation maintained. No encephalomalacia to suggest chronic cortical infarction. No acute intracranial hemorrhage. Few scattered punctate foci of susceptibility artifact about the right thalamus and bilateral cerebral hemispheres, most consistent with chronic microhemorrhages, likely small vessel related. Note made of a 5 mm focus of susceptibility artifact at the left thalamus, corresponding with previously noted hyperdensity on prior CT. This appears to correspond with calcification/mineralization on phase sequence. No mass lesion, midline shift, or mass effect. Diffuse ventricular prominence related to global parenchymal volume loss without hydrocephalus. No extra-axial fluid collection. Pituitary gland suprasellar region grossly within normal limits. Midline structures intact. Vascular: Major intracranial vascular flow voids are maintained. Skull and upper cervical spine: Craniocervical junction within normal limits. Bone marrow signal intensity grossly normal. No visible scalp soft tissue abnormality. Sinuses/Orbits: Globes and orbital soft tissues demonstrate no acute finding. Moderate mucosal thickening noted throughout the ethmoidal air cells and maxillary sinuses. Superimposed air-fluid levels within the maxillary sinuses suggest acute sinusitis, right greater than left. No mastoid effusion. Other: None. MRA HEAD FINDINGS ANTERIOR CIRCULATION: Visualized distal cervical  segments of the internal carotid arteries are patent with antegrade flow. Petrous segments patent bilaterally. Short-segment mild stenosis noted at the proximal cavernous right ICA (series 2, image 102). Cavernous and supraclinoid ICAs otherwise widely patent without hemodynamically significant stenosis. A1 segments patent bilaterally. Normal anterior communicating artery complex. Anterior cerebral arteries widely patent to their distal aspects without stenosis. No M1 stenosis or occlusion. Normal MCA bifurcations. Distal MCA branches well perfused and symmetric. POSTERIOR CIRCULATION: Vertebral arteries widely patent to the vertebrobasilar junction without stenosis. Left vertebral artery dominant. Partially visualized left PICA appears patent. Right PICA not seen. Basilar widely patent to its distal aspect without stenosis. Superior cerebral arteries patent bilaterally. Both PCAs primarily supplied via the basilar well perfused to their distal aspects. No intracranial aneurysm. MRA NECK FINDINGS AORTIC ARCH: Visualized aortic arch of normal caliber with normal 3 vessel  morphology. No hemodynamically significant stenosis seen about the origin of the great vessels. RIGHT CAROTID SYSTEM: Right CCA widely patent from its origin to the bifurcation without stenosis. Mild atheromatous irregularity about the right bifurcation/proximal right ICA without hemodynamically significant stenosis. Right ICA widely patent distally to the skull base without stenosis or occlusion. LEFT CAROTID SYSTEM: Left common carotid artery widely patent from its origin to the bifurcation without stenosis. No significant atheromatous narrowing or irregularity about the left bifurcation. Left ICA widely patent distally to the skull base without stenosis or occlusion. VERTEBRAL ARTERIES: Both vertebral arteries arise from the subclavian arteries. No proximal subclavian artery stenosis. Left vertebral artery dominant. Both vertebral arteries widely  patent within the neck without stenosis or occlusion. IMPRESSION: MRI HEAD IMPRESSION: 1. No acute intracranial abnormality. 2. Moderately advanced age-related cerebral atrophy with chronic microvascular ischemic disease, with a few scattered remote lacunar infarcts about the bilateral basal ganglia, thalami, and hemispheric cerebral white matter. 3. Small focus of susceptibility artifact at the left thalamus, corresponding with hyperdensity seen on prior CT. This is most consistent with a small focus of mineralization/calcification. No acute intracranial hemorrhage. 4. Acute ethmoidal and maxillary sinusitis, right worse than left. MRA HEAD IMPRESSION: Negative intracranial MRA. No large vessel occlusion or hemodynamically significant stenosis. No aneurysm. MRA NECK IMPRESSION: Negative MRA of the neck. No hemodynamically significant or critical flow limiting stenosis about either carotid artery system. Both vertebral arteries widely patent within the neck. Electronically Signed   By: Rise Mu M.D.   On: 11-Nov-2019 22:10   MR BRAIN WO CONTRAST  Result Date: November 11, 2019 CLINICAL DATA:  Initial evaluation for acute stroke, right-sided weakness. EXAM: MRI HEAD WITHOUT CONTRAST MRA HEAD WITHOUT CONTRAST MRA NECK WITHOUT AND WITH CONTRAST TECHNIQUE: Multiplanar, multiecho pulse sequences of the brain and surrounding structures were obtained without intravenous contrast. Angiographic images of the Circle of Willis were obtained using MRA technique without intravenous contrast. Angiographic images of the neck were obtained using MRA technique without and with intravenous contrast. Carotid stenosis measurements (when applicable) are obtained utilizing NASCET criteria, using the distal internal carotid diameter as the denominator. CONTRAST:  8mL GADAVIST GADOBUTROL 1 MMOL/ML IV SOLN COMPARISON:  Comparison made with prior head CT from earlier the same day. FINDINGS: MRI HEAD FINDINGS Brain: Examination  moderately degraded by motion artifact. Diffuse prominence of the CSF containing spaces compatible with generalized age-related cerebral atrophy, moderately advanced for age. Patchy and confluent T2/FLAIR hyperintensity within the periventricular and deep white matter both cerebral hemispheres most consistent with chronic small vessel ischemic disease, moderate in nature. Patchy involvement of the pons noted. Multiple scattered remote lacunar infarcts present about the bilateral basal ganglia, thalami, and hemispheric cerebral white matter. No abnormal foci of restricted diffusion to suggest acute or subacute ischemia. Gray-white matter differentiation maintained. No encephalomalacia to suggest chronic cortical infarction. No acute intracranial hemorrhage. Few scattered punctate foci of susceptibility artifact about the right thalamus and bilateral cerebral hemispheres, most consistent with chronic microhemorrhages, likely small vessel related. Note made of a 5 mm focus of susceptibility artifact at the left thalamus, corresponding with previously noted hyperdensity on prior CT. This appears to correspond with calcification/mineralization on phase sequence. No mass lesion, midline shift, or mass effect. Diffuse ventricular prominence related to global parenchymal volume loss without hydrocephalus. No extra-axial fluid collection. Pituitary gland suprasellar region grossly within normal limits. Midline structures intact. Vascular: Major intracranial vascular flow voids are maintained. Skull and upper cervical spine: Craniocervical junction within normal limits. Bone marrow  signal intensity grossly normal. No visible scalp soft tissue abnormality. Sinuses/Orbits: Globes and orbital soft tissues demonstrate no acute finding. Moderate mucosal thickening noted throughout the ethmoidal air cells and maxillary sinuses. Superimposed air-fluid levels within the maxillary sinuses suggest acute sinusitis, right greater than  left. No mastoid effusion. Other: None. MRA HEAD FINDINGS ANTERIOR CIRCULATION: Visualized distal cervical segments of the internal carotid arteries are patent with antegrade flow. Petrous segments patent bilaterally. Short-segment mild stenosis noted at the proximal cavernous right ICA (series 2, image 102). Cavernous and supraclinoid ICAs otherwise widely patent without hemodynamically significant stenosis. A1 segments patent bilaterally. Normal anterior communicating artery complex. Anterior cerebral arteries widely patent to their distal aspects without stenosis. No M1 stenosis or occlusion. Normal MCA bifurcations. Distal MCA branches well perfused and symmetric. POSTERIOR CIRCULATION: Vertebral arteries widely patent to the vertebrobasilar junction without stenosis. Left vertebral artery dominant. Partially visualized left PICA appears patent. Right PICA not seen. Basilar widely patent to its distal aspect without stenosis. Superior cerebral arteries patent bilaterally. Both PCAs primarily supplied via the basilar well perfused to their distal aspects. No intracranial aneurysm. MRA NECK FINDINGS AORTIC ARCH: Visualized aortic arch of normal caliber with normal 3 vessel morphology. No hemodynamically significant stenosis seen about the origin of the great vessels. RIGHT CAROTID SYSTEM: Right CCA widely patent from its origin to the bifurcation without stenosis. Mild atheromatous irregularity about the right bifurcation/proximal right ICA without hemodynamically significant stenosis. Right ICA widely patent distally to the skull base without stenosis or occlusion. LEFT CAROTID SYSTEM: Left common carotid artery widely patent from its origin to the bifurcation without stenosis. No significant atheromatous narrowing or irregularity about the left bifurcation. Left ICA widely patent distally to the skull base without stenosis or occlusion. VERTEBRAL ARTERIES: Both vertebral arteries arise from the subclavian  arteries. No proximal subclavian artery stenosis. Left vertebral artery dominant. Both vertebral arteries widely patent within the neck without stenosis or occlusion. IMPRESSION: MRI HEAD IMPRESSION: 1. No acute intracranial abnormality. 2. Moderately advanced age-related cerebral atrophy with chronic microvascular ischemic disease, with a few scattered remote lacunar infarcts about the bilateral basal ganglia, thalami, and hemispheric cerebral white matter. 3. Small focus of susceptibility artifact at the left thalamus, corresponding with hyperdensity seen on prior CT. This is most consistent with a small focus of mineralization/calcification. No acute intracranial hemorrhage. 4. Acute ethmoidal and maxillary sinusitis, right worse than left. MRA HEAD IMPRESSION: Negative intracranial MRA. No large vessel occlusion or hemodynamically significant stenosis. No aneurysm. MRA NECK IMPRESSION: Negative MRA of the neck. No hemodynamically significant or critical flow limiting stenosis about either carotid artery system. Both vertebral arteries widely patent within the neck. Electronically Signed   By: Rise Mu M.D.   On: 10/16/2019 22:10   DG Chest Portable 1 View  Result Date: 11/06/2019 CLINICAL DATA:  COVID positive. EXAM: PORTABLE CHEST 1 VIEW COMPARISON:  October 24, 2019 FINDINGS: Mild infiltrates are seen along the periphery of the right lung. This is increased in severity when compared to the prior study. There is no evidence of a pleural effusion or pneumothorax. The heart size and mediastinal contours are within normal limits. The visualized skeletal structures are unremarkable. IMPRESSION: Mild right lung infiltrates, increased in severity when compared to the prior study. Electronically Signed   By: Aram Candela M.D.   On: 10/29/2019 18:02   CT HEAD CODE STROKE WO CONTRAST  Addendum Date: 11/09/2019   ADDENDUM REPORT: 11/01/2019 17:44 ADDENDUM: These results were called by telephone  at  the time of interpretation on 10/25/2019 at 5:38 pm to provider Dr. Iver Nestle, who verbally acknowledged these results. Electronically Signed   By: Jackey Loge DO   On: 11/05/2019 17:44   Result Date: 11/01/2019 CLINICAL DATA:  Code stroke. Neuro deficit, acute, stroke suspected. EXAM: CT HEAD WITHOUT CONTRAST TECHNIQUE: Contiguous axial images were obtained from the base of the skull through the vertex without intravenous contrast. COMPARISON:  Noncontrast head CT 10/24/2019 FINDINGS: Brain: Stable, moderate generalized parenchymal atrophy. As before, there is advanced ill-defined hypoattenuation within the cerebral white matter which is nonspecific, but consistent with chronic small vessel ischemic disease. Redemonstrated multiple chronic lacunar infarcts within the bilateral basal ganglia and left thalamus. New from the prior head CT of 10/24/2019, there is a 5 mm focus of hyperdensity within the left thalamocapsular junction at site of a chronic lacunar infarct which may reflect a small parenchymal hemorrhage or mineralization (series 3, image 16) (series 5, image 43). No demarcated cortical infarct is identified. No extra-axial fluid collection. No evidence of intracranial mass. No midline shift. Vascular: No definite hyperdense vessel. Atherosclerotic calcifications Skull: Normal. Negative for fracture or focal lesion. Sinuses/Orbits: Visualized orbits show no acute finding. Extensive partial opacification of bilateral ethmoid air cells. Frothy secretions and small air-fluid level within the left maxillary sinus. Near complete opacification of the right maxillary sinus. Mild-to-moderate mucosal thickening within the sphenoid sinuses. No significant mastoid effusion. ASPECTS ([place] Stroke Program Early CT Score) - Ganglionic level infarction (caudate, lentiform nuclei, internal capsule, insula, M1-M3 cortex): 7 - Supraganglionic infarction (M4-M6 cortex): 3 Total score (0-10 with 10 being normal): 10  IMPRESSION: New from the prior head CT of 10/24/2019, there is a 5 mm focus of hyperdensity within the left thalamocapsular junction (at site of a chronic lacunar infarct) which may reflect parenchymal hemorrhage or mineralization. No acute demarcated cortical infarct is identified. Unchanged moderate generalized parenchymal atrophy and advanced cerebral white matter chronic small vessel ischemic disease. Redemonstrated chronic lacunar infarcts within the bilateral basal ganglia and left thalamus. Paranasal sinus disease, most notably ethmoid and maxillary sinusitis. Electronically Signed: By: Jackey Loge DO On: 11/07/2019 17:37    Impression/Recommendations Principal Problem:   Acute metabolic encephalopathy Active Problems:   Essential hypertension   Dyslipidemia   Acute on chronic kidney failure (HCC)   Right hemiparesis (HCC)   Pneumonia due to COVID-19 virus   Acute hypoxemic respiratory failure (HCC)   Acute metabolic encephalopathy: -Likely in the setting of COVID-19 pneumonia.  MRI brain/MRA head and neck: Negative for ischemia/hemorrhage. -Patient tested positive for COVID-19 on 9/12.  Presented with fever of 101.2, tachypnea, hypoxia requiring 2 L of oxygen via nasal cannula, lactic acid of 2.1, PCT: 0.44, leukocytosis of 17,000. -Patient received azithromycin and Rocephin in ED. -Discussed with the pharmacy-start patient on Solu-Medrol and baricitinib.  On continuous pulse ox.  We will try to wean off of oxygen as tolerated. -CRP: 11.2, inflammatory markers from yesterday: Elevated.  Repeat inflammatory markers today. -We will keep him n.p.o. until he passes bedside swallow evaluation.  Neurochecks. -Ethanol, UDS: WNL.  We will check B12, folate, ammonia level, RPR -Consult PT/OT/SLP -On fall/aspiration precautions  AKI on CKD stage IIIb: -Creatinine: 2.07, GFR: 36 (baseline creatinine: 1.62, GFR: 48) -Received IV fluids in ED.  Hold nephrotoxic medication.  Monitor kidney  function closely.  Leukocytosis: Likely secondary to recent steroid use -WBC count improving from 17,000-14,000 -Repeat CBC tomorrow a.m.  Hypokalemia: Replenished.  Repeat BMP tomorrow a.m.  History of stroke with  right-sided residual weakness: -Can resume home p.o. meds-statin, Plavix if he passes bedside swallow evaluation.  Hypertension: Stable -Continue home meds amlodipine and metoprolol  BPH: Continue Proscar  Thrombocytopenia: Platelet: 122. -No signs of active bleeding.  Repeat CBC tomorrow AM.  Please note: Patient refused blood work-up.  All inflammatory markers are pending at this time.  I talked to patient's son over the phone and discussed plan of care and he verbalized understanding.  Thank you for this consultation.  Our [facility] hospitalist team will follow the patient with you.   Time Spent: 35 minutes  Ollen Bowl M.D. [facility] 11/03/2019, 10:13 AM

## 2019-11-03 NOTE — Progress Notes (Addendum)
STROKE TEAM PROGRESS NOTE   INTERVAL HISTORY Pt RN at bedside. Pt initially sleeping but easily arousable and able to maintain wakefulness. He is orientated to self, place and age but not to time. He still has right sided weakness, pt can not tell me whether is the same as his baseline after stroke or worsened. Tmax 101.2. Cre and WBC improving, CXR yesterday showed worsening COVID pneumonia. MRI showed no ICH. Hospitalist Dr. Jacqulyn Bath on board, appreciate her help.    Vitals:   11/03/19 0715 11/03/19 0730 11/03/19 0800 11/03/19 0814  BP: 109/83 120/80 (!) 154/92   Pulse: 92 83 97   Resp: (!) 28 (!) 29 (!) 45   Temp:    98.9 F (37.2 C)  TempSrc:    Oral  SpO2: 94% 95% 91%    CBC:  Recent Labs  Lab 11-07-2019 1711 2019-11-07 1724  WBC 17.0*  --   NEUTROABS 14.9*  --   HGB 16.0 15.6  HCT 47.7 46.0  MCV 91.7  --   PLT 179  --    Basic Metabolic Panel:  Recent Labs  Lab 11/07/2019 1711 07-Nov-2019 1724  NA 146* 145  K 3.6 3.7  CL 109 110  CO2 22  --   GLUCOSE 153* 156*  BUN 27* 34*  CREATININE 2.07* 1.80*  CALCIUM 8.0*  --    Lipid Panel:  Recent Labs  Lab Nov 07, 2019 2220  TRIG 75   HgbA1c: No results for input(s): HGBA1C in the last 168 hours. Urine Drug Screen:  Recent Labs  Lab 2019-11-07 1845  LABOPIA NONE DETECTED  COCAINSCRNUR NONE DETECTED  LABBENZ NONE DETECTED  AMPHETMU NONE DETECTED  THCU NONE DETECTED  LABBARB NONE DETECTED    Alcohol Level  Recent Labs  Lab Nov 07, 2019 1711  ETH <10    IMAGING past 24 hours MR ANGIO HEAD WO CONTRAST  Result Date: Nov 07, 2019 CLINICAL DATA:  Initial evaluation for acute stroke, right-sided weakness. EXAM: MRI HEAD WITHOUT CONTRAST MRA HEAD WITHOUT CONTRAST MRA NECK WITHOUT AND WITH CONTRAST TECHNIQUE: Multiplanar, multiecho pulse sequences of the brain and surrounding structures were obtained without intravenous contrast. Angiographic images of the Circle of Willis were obtained using MRA technique without intravenous  contrast. Angiographic images of the neck were obtained using MRA technique without and with intravenous contrast. Carotid stenosis measurements (when applicable) are obtained utilizing NASCET criteria, using the distal internal carotid diameter as the denominator. CONTRAST:  8mL GADAVIST GADOBUTROL 1 MMOL/ML IV SOLN COMPARISON:  Comparison made with prior head CT from earlier the same day. FINDINGS: MRI HEAD FINDINGS Brain: Examination moderately degraded by motion artifact. Diffuse prominence of the CSF containing spaces compatible with generalized age-related cerebral atrophy, moderately advanced for age. Patchy and confluent T2/FLAIR hyperintensity within the periventricular and deep white matter both cerebral hemispheres most consistent with chronic small vessel ischemic disease, moderate in nature. Patchy involvement of the pons noted. Multiple scattered remote lacunar infarcts present about the bilateral basal ganglia, thalami, and hemispheric cerebral white matter. No abnormal foci of restricted diffusion to suggest acute or subacute ischemia. Gray-white matter differentiation maintained. No encephalomalacia to suggest chronic cortical infarction. No acute intracranial hemorrhage. Few scattered punctate foci of susceptibility artifact about the right thalamus and bilateral cerebral hemispheres, most consistent with chronic microhemorrhages, likely small vessel related. Note made of a 5 mm focus of susceptibility artifact at the left thalamus, corresponding with previously noted hyperdensity on prior CT. This appears to correspond with calcification/mineralization on phase sequence. No mass lesion, midline  shift, or mass effect. Diffuse ventricular prominence related to global parenchymal volume loss without hydrocephalus. No extra-axial fluid collection. Pituitary gland suprasellar region grossly within normal limits. Midline structures intact. Vascular: Major intracranial vascular flow voids are maintained.  Skull and upper cervical spine: Craniocervical junction within normal limits. Bone marrow signal intensity grossly normal. No visible scalp soft tissue abnormality. Sinuses/Orbits: Globes and orbital soft tissues demonstrate no acute finding. Moderate mucosal thickening noted throughout the ethmoidal air cells and maxillary sinuses. Superimposed air-fluid levels within the maxillary sinuses suggest acute sinusitis, right greater than left. No mastoid effusion. Other: None. MRA HEAD FINDINGS ANTERIOR CIRCULATION: Visualized distal cervical segments of the internal carotid arteries are patent with antegrade flow. Petrous segments patent bilaterally. Short-segment mild stenosis noted at the proximal cavernous right ICA (series 2, image 102). Cavernous and supraclinoid ICAs otherwise widely patent without hemodynamically significant stenosis. A1 segments patent bilaterally. Normal anterior communicating artery complex. Anterior cerebral arteries widely patent to their distal aspects without stenosis. No M1 stenosis or occlusion. Normal MCA bifurcations. Distal MCA branches well perfused and symmetric. POSTERIOR CIRCULATION: Vertebral arteries widely patent to the vertebrobasilar junction without stenosis. Left vertebral artery dominant. Partially visualized left PICA appears patent. Right PICA not seen. Basilar widely patent to its distal aspect without stenosis. Superior cerebral arteries patent bilaterally. Both PCAs primarily supplied via the basilar well perfused to their distal aspects. No intracranial aneurysm. MRA NECK FINDINGS AORTIC ARCH: Visualized aortic arch of normal caliber with normal 3 vessel morphology. No hemodynamically significant stenosis seen about the origin of the great vessels. RIGHT CAROTID SYSTEM: Right CCA widely patent from its origin to the bifurcation without stenosis. Mild atheromatous irregularity about the right bifurcation/proximal right ICA without hemodynamically significant  stenosis. Right ICA widely patent distally to the skull base without stenosis or occlusion. LEFT CAROTID SYSTEM: Left common carotid artery widely patent from its origin to the bifurcation without stenosis. No significant atheromatous narrowing or irregularity about the left bifurcation. Left ICA widely patent distally to the skull base without stenosis or occlusion. VERTEBRAL ARTERIES: Both vertebral arteries arise from the subclavian arteries. No proximal subclavian artery stenosis. Left vertebral artery dominant. Both vertebral arteries widely patent within the neck without stenosis or occlusion. IMPRESSION: MRI HEAD IMPRESSION: 1. No acute intracranial abnormality. 2. Moderately advanced age-related cerebral atrophy with chronic microvascular ischemic disease, with a few scattered remote lacunar infarcts about the bilateral basal ganglia, thalami, and hemispheric cerebral white matter. 3. Small focus of susceptibility artifact at the left thalamus, corresponding with hyperdensity seen on prior CT. This is most consistent with a small focus of mineralization/calcification. No acute intracranial hemorrhage. 4. Acute ethmoidal and maxillary sinusitis, right worse than left. MRA HEAD IMPRESSION: Negative intracranial MRA. No large vessel occlusion or hemodynamically significant stenosis. No aneurysm. MRA NECK IMPRESSION: Negative MRA of the neck. No hemodynamically significant or critical flow limiting stenosis about either carotid artery system. Both vertebral arteries widely patent within the neck. Electronically Signed   By: Rise Mu M.D.   On: 11/26/19 22:10   MR ANGIO NECK W WO CONTRAST  Result Date: 11/26/2019 CLINICAL DATA:  Initial evaluation for acute stroke, right-sided weakness. EXAM: MRI HEAD WITHOUT CONTRAST MRA HEAD WITHOUT CONTRAST MRA NECK WITHOUT AND WITH CONTRAST TECHNIQUE: Multiplanar, multiecho pulse sequences of the brain and surrounding structures were obtained without  intravenous contrast. Angiographic images of the Circle of Willis were obtained using MRA technique without intravenous contrast. Angiographic images of the neck were obtained using MRA technique without  and with intravenous contrast. Carotid stenosis measurements (when applicable) are obtained utilizing NASCET criteria, using the distal internal carotid diameter as the denominator. CONTRAST:  8mL GADAVIST GADOBUTROL 1 MMOL/ML IV SOLN COMPARISON:  Comparison made with prior head CT from earlier the same day. FINDINGS: MRI HEAD FINDINGS Brain: Examination moderately degraded by motion artifact. Diffuse prominence of the CSF containing spaces compatible with generalized age-related cerebral atrophy, moderately advanced for age. Patchy and confluent T2/FLAIR hyperintensity within the periventricular and deep white matter both cerebral hemispheres most consistent with chronic small vessel ischemic disease, moderate in nature. Patchy involvement of the pons noted. Multiple scattered remote lacunar infarcts present about the bilateral basal ganglia, thalami, and hemispheric cerebral white matter. No abnormal foci of restricted diffusion to suggest acute or subacute ischemia. Gray-white matter differentiation maintained. No encephalomalacia to suggest chronic cortical infarction. No acute intracranial hemorrhage. Few scattered punctate foci of susceptibility artifact about the right thalamus and bilateral cerebral hemispheres, most consistent with chronic microhemorrhages, likely small vessel related. Note made of a 5 mm focus of susceptibility artifact at the left thalamus, corresponding with previously noted hyperdensity on prior CT. This appears to correspond with calcification/mineralization on phase sequence. No mass lesion, midline shift, or mass effect. Diffuse ventricular prominence related to global parenchymal volume loss without hydrocephalus. No extra-axial fluid collection. Pituitary gland suprasellar region  grossly within normal limits. Midline structures intact. Vascular: Major intracranial vascular flow voids are maintained. Skull and upper cervical spine: Craniocervical junction within normal limits. Bone marrow signal intensity grossly normal. No visible scalp soft tissue abnormality. Sinuses/Orbits: Globes and orbital soft tissues demonstrate no acute finding. Moderate mucosal thickening noted throughout the ethmoidal air cells and maxillary sinuses. Superimposed air-fluid levels within the maxillary sinuses suggest acute sinusitis, right greater than left. No mastoid effusion. Other: None. MRA HEAD FINDINGS ANTERIOR CIRCULATION: Visualized distal cervical segments of the internal carotid arteries are patent with antegrade flow. Petrous segments patent bilaterally. Short-segment mild stenosis noted at the proximal cavernous right ICA (series 2, image 102). Cavernous and supraclinoid ICAs otherwise widely patent without hemodynamically significant stenosis. A1 segments patent bilaterally. Normal anterior communicating artery complex. Anterior cerebral arteries widely patent to their distal aspects without stenosis. No M1 stenosis or occlusion. Normal MCA bifurcations. Distal MCA branches well perfused and symmetric. POSTERIOR CIRCULATION: Vertebral arteries widely patent to the vertebrobasilar junction without stenosis. Left vertebral artery dominant. Partially visualized left PICA appears patent. Right PICA not seen. Basilar widely patent to its distal aspect without stenosis. Superior cerebral arteries patent bilaterally. Both PCAs primarily supplied via the basilar well perfused to their distal aspects. No intracranial aneurysm. MRA NECK FINDINGS AORTIC ARCH: Visualized aortic arch of normal caliber with normal 3 vessel morphology. No hemodynamically significant stenosis seen about the origin of the great vessels. RIGHT CAROTID SYSTEM: Right CCA widely patent from its origin to the bifurcation without stenosis.  Mild atheromatous irregularity about the right bifurcation/proximal right ICA without hemodynamically significant stenosis. Right ICA widely patent distally to the skull base without stenosis or occlusion. LEFT CAROTID SYSTEM: Left common carotid artery widely patent from its origin to the bifurcation without stenosis. No significant atheromatous narrowing or irregularity about the left bifurcation. Left ICA widely patent distally to the skull base without stenosis or occlusion. VERTEBRAL ARTERIES: Both vertebral arteries arise from the subclavian arteries. No proximal subclavian artery stenosis. Left vertebral artery dominant. Both vertebral arteries widely patent within the neck without stenosis or occlusion. IMPRESSION: MRI HEAD IMPRESSION: 1. No acute intracranial abnormality. 2.  Moderately advanced age-related cerebral atrophy with chronic microvascular ischemic disease, with a few scattered remote lacunar infarcts about the bilateral basal ganglia, thalami, and hemispheric cerebral white matter. 3. Small focus of susceptibility artifact at the left thalamus, corresponding with hyperdensity seen on prior CT. This is most consistent with a small focus of mineralization/calcification. No acute intracranial hemorrhage. 4. Acute ethmoidal and maxillary sinusitis, right worse than left. MRA HEAD IMPRESSION: Negative intracranial MRA. No large vessel occlusion or hemodynamically significant stenosis. No aneurysm. MRA NECK IMPRESSION: Negative MRA of the neck. No hemodynamically significant or critical flow limiting stenosis about either carotid artery system. Both vertebral arteries widely patent within the neck. Electronically Signed   By: Rise Mu M.D.   On: 10/30/2019 22:10   MR BRAIN WO CONTRAST  Result Date: 10/31/2019 CLINICAL DATA:  Initial evaluation for acute stroke, right-sided weakness. EXAM: MRI HEAD WITHOUT CONTRAST MRA HEAD WITHOUT CONTRAST MRA NECK WITHOUT AND WITH CONTRAST TECHNIQUE:  Multiplanar, multiecho pulse sequences of the brain and surrounding structures were obtained without intravenous contrast. Angiographic images of the Circle of Willis were obtained using MRA technique without intravenous contrast. Angiographic images of the neck were obtained using MRA technique without and with intravenous contrast. Carotid stenosis measurements (when applicable) are obtained utilizing NASCET criteria, using the distal internal carotid diameter as the denominator. CONTRAST:  63mL GADAVIST GADOBUTROL 1 MMOL/ML IV SOLN COMPARISON:  Comparison made with prior head CT from earlier the same day. FINDINGS: MRI HEAD FINDINGS Brain: Examination moderately degraded by motion artifact. Diffuse prominence of the CSF containing spaces compatible with generalized age-related cerebral atrophy, moderately advanced for age. Patchy and confluent T2/FLAIR hyperintensity within the periventricular and deep white matter both cerebral hemispheres most consistent with chronic small vessel ischemic disease, moderate in nature. Patchy involvement of the pons noted. Multiple scattered remote lacunar infarcts present about the bilateral basal ganglia, thalami, and hemispheric cerebral white matter. No abnormal foci of restricted diffusion to suggest acute or subacute ischemia. Gray-white matter differentiation maintained. No encephalomalacia to suggest chronic cortical infarction. No acute intracranial hemorrhage. Few scattered punctate foci of susceptibility artifact about the right thalamus and bilateral cerebral hemispheres, most consistent with chronic microhemorrhages, likely small vessel related. Note made of a 5 mm focus of susceptibility artifact at the left thalamus, corresponding with previously noted hyperdensity on prior CT. This appears to correspond with calcification/mineralization on phase sequence. No mass lesion, midline shift, or mass effect. Diffuse ventricular prominence related to global parenchymal  volume loss without hydrocephalus. No extra-axial fluid collection. Pituitary gland suprasellar region grossly within normal limits. Midline structures intact. Vascular: Major intracranial vascular flow voids are maintained. Skull and upper cervical spine: Craniocervical junction within normal limits. Bone marrow signal intensity grossly normal. No visible scalp soft tissue abnormality. Sinuses/Orbits: Globes and orbital soft tissues demonstrate no acute finding. Moderate mucosal thickening noted throughout the ethmoidal air cells and maxillary sinuses. Superimposed air-fluid levels within the maxillary sinuses suggest acute sinusitis, right greater than left. No mastoid effusion. Other: None. MRA HEAD FINDINGS ANTERIOR CIRCULATION: Visualized distal cervical segments of the internal carotid arteries are patent with antegrade flow. Petrous segments patent bilaterally. Short-segment mild stenosis noted at the proximal cavernous right ICA (series 2, image 102). Cavernous and supraclinoid ICAs otherwise widely patent without hemodynamically significant stenosis. A1 segments patent bilaterally. Normal anterior communicating artery complex. Anterior cerebral arteries widely patent to their distal aspects without stenosis. No M1 stenosis or occlusion. Normal MCA bifurcations. Distal MCA branches well perfused and symmetric. POSTERIOR  CIRCULATION: Vertebral arteries widely patent to the vertebrobasilar junction without stenosis. Left vertebral artery dominant. Partially visualized left PICA appears patent. Right PICA not seen. Basilar widely patent to its distal aspect without stenosis. Superior cerebral arteries patent bilaterally. Both PCAs primarily supplied via the basilar well perfused to their distal aspects. No intracranial aneurysm. MRA NECK FINDINGS AORTIC ARCH: Visualized aortic arch of normal caliber with normal 3 vessel morphology. No hemodynamically significant stenosis seen about the origin of the great  vessels. RIGHT CAROTID SYSTEM: Right CCA widely patent from its origin to the bifurcation without stenosis. Mild atheromatous irregularity about the right bifurcation/proximal right ICA without hemodynamically significant stenosis. Right ICA widely patent distally to the skull base without stenosis or occlusion. LEFT CAROTID SYSTEM: Left common carotid artery widely patent from its origin to the bifurcation without stenosis. No significant atheromatous narrowing or irregularity about the left bifurcation. Left ICA widely patent distally to the skull base without stenosis or occlusion. VERTEBRAL ARTERIES: Both vertebral arteries arise from the subclavian arteries. No proximal subclavian artery stenosis. Left vertebral artery dominant. Both vertebral arteries widely patent within the neck without stenosis or occlusion. IMPRESSION: MRI HEAD IMPRESSION: 1. No acute intracranial abnormality. 2. Moderately advanced age-related cerebral atrophy with chronic microvascular ischemic disease, with a few scattered remote lacunar infarcts about the bilateral basal ganglia, thalami, and hemispheric cerebral white matter. 3. Small focus of susceptibility artifact at the left thalamus, corresponding with hyperdensity seen on prior CT. This is most consistent with a small focus of mineralization/calcification. No acute intracranial hemorrhage. 4. Acute ethmoidal and maxillary sinusitis, right worse than left. MRA HEAD IMPRESSION: Negative intracranial MRA. No large vessel occlusion or hemodynamically significant stenosis. No aneurysm. MRA NECK IMPRESSION: Negative MRA of the neck. No hemodynamically significant or critical flow limiting stenosis about either carotid artery system. Both vertebral arteries widely patent within the neck. Electronically Signed   By: Rise Mu M.D.   On: 11/08/2019 22:10   DG Chest Portable 1 View  Result Date: 10/20/2019 CLINICAL DATA:  COVID positive. EXAM: PORTABLE CHEST 1 VIEW  COMPARISON:  October 24, 2019 FINDINGS: Mild infiltrates are seen along the periphery of the right lung. This is increased in severity when compared to the prior study. There is no evidence of a pleural effusion or pneumothorax. The heart size and mediastinal contours are within normal limits. The visualized skeletal structures are unremarkable. IMPRESSION: Mild right lung infiltrates, increased in severity when compared to the prior study. Electronically Signed   By: Aram Candela M.D.   On: 11/06/2019 18:02   CT HEAD CODE STROKE WO CONTRAST  Addendum Date: 10/14/2019   ADDENDUM REPORT: 10/23/2019 17:44 ADDENDUM: These results were called by telephone at the time of interpretation on 10/20/2019 at 5:38 pm to provider Dr. Iver Nestle, who verbally acknowledged these results. Electronically Signed   By: Jackey Loge DO   On: 11/04/2019 17:44   Result Date: 10/18/2019 CLINICAL DATA:  Code stroke. Neuro deficit, acute, stroke suspected. EXAM: CT HEAD WITHOUT CONTRAST TECHNIQUE: Contiguous axial images were obtained from the base of the skull through the vertex without intravenous contrast. COMPARISON:  Noncontrast head CT 10/24/2019 FINDINGS: Brain: Stable, moderate generalized parenchymal atrophy. As before, there is advanced ill-defined hypoattenuation within the cerebral white matter which is nonspecific, but consistent with chronic small vessel ischemic disease. Redemonstrated multiple chronic lacunar infarcts within the bilateral basal ganglia and left thalamus. New from the prior head CT of 10/24/2019, there is a 5 mm focus of hyperdensity within  the left thalamocapsular junction at site of a chronic lacunar infarct which may reflect a small parenchymal hemorrhage or mineralization (series 3, image 16) (series 5, image 43). No demarcated cortical infarct is identified. No extra-axial fluid collection. No evidence of intracranial mass. No midline shift. Vascular: No definite hyperdense vessel.  Atherosclerotic calcifications Skull: Normal. Negative for fracture or focal lesion. Sinuses/Orbits: Visualized orbits show no acute finding. Extensive partial opacification of bilateral ethmoid air cells. Frothy secretions and small air-fluid level within the left maxillary sinus. Near complete opacification of the right maxillary sinus. Mild-to-moderate mucosal thickening within the sphenoid sinuses. No significant mastoid effusion. ASPECTS ([place] Stroke Program Early CT Score) - Ganglionic level infarction (caudate, lentiform nuclei, internal capsule, insula, M1-M3 cortex): 7 - Supraganglionic infarction (M4-M6 cortex): 3 Total score (0-10 with 10 being normal): 10 IMPRESSION: New from the prior head CT of 10/24/2019, there is a 5 mm focus of hyperdensity within the left thalamocapsular junction (at site of a chronic lacunar infarct) which may reflect parenchymal hemorrhage or mineralization. No acute demarcated cortical infarct is identified. Unchanged moderate generalized parenchymal atrophy and advanced cerebral white matter chronic small vessel ischemic disease. Redemonstrated chronic lacunar infarcts within the bilateral basal ganglia and left thalamus. Paranasal sinus disease, most notably ethmoid and maxillary sinusitis. Electronically Signed: By: Jackey LogeKyle  Golden DO On: 11/11/2019 17:37    PHYSICAL EXAM  Temp:  [97.9 F (36.6 C)-101.2 F (38.4 C)] 98.9 F (37.2 C) (09/22 0814) Pulse Rate:  [76-99] 97 (09/22 0800) Resp:  [15-45] 45 (09/22 0800) BP: (109-154)/(80-96) 154/92 (09/22 0800) SpO2:  [91 %-97 %] 91 % (09/22 0800)  General - Well nourished, well developed, in no apparent distress.  Ophthalmologic - fundi not visualized due to noncooperation.  Cardiovascular - Regular rhythm and rate.  Neuro - initially sleeping but easily arousable and able to maintain wakefulness. Orientated to place, age and self, but not to time. No aphasia, following simple commands, able to repeat. Naming  2/3. PERRL, EOMI, visual field full. Right facial droop and tongue midline. Right UE proximal 0/5, bicep 3/5 and finger grip 2/5. right LE proximal 0/5, knee flexion 2/5 and distal DF/PF 0/5. LUE 4/5 and LLE 3/5 at least. Sensation subjectively symmetrical. FTN on the left grossly intact. Gait not tested.   ASSESSMENT/PLAN Robert Buckley is a 74 y.o. male with history of HTN, stroke (2010) w/ residual R hemiparesis, COVID infection w/ recent admission 10/24/19-10/27/19, CKD IIIa, HLD presenting with confusion, increased R arm weakness and hypoxia.   COVID PNA w/ hypoxia  Recent COVID dx w/ hospitalization 9/12-9/15/2021 for PNA, treated w/ Remdesivir (3 IP, 2 OP)  Tmax 101.2, tachypneic, hypoxic  CXR R lung infiltrates, increased from prior  WBC 17->14.4  D-dimer 1.08  BNP 1685.2  LDH 348  Ferretin 433  Fibrinogen 189  CRP 1.6->11.2  Lactic acid 2.1  PCT 0.44  Medical Hospitalist consult - appreciate Dr. Jacqulyn BathPahwani help  Transition to medical team 9/23  COVID encephalopathy Recrudescence of chronic L thalamic infarct   09/2016 MRI L white matter / posterior corona radiata infarct   Code Stroke CT head new L thalamocapsular jxn hyperdensity new from 11/05/2019 possible IPH or mineralization. No acute cortical infarct. Unchanged Small vessel disease. Atrophy. Sinus dz.  MRI  No acute abnormality. Small vessel disease. Atrophy. Few scattered old B basal ganglia, thalamic and cerebral white matter lacunes. L thalamic mineralization/calcification, no ICH.    MRA head Unremarkable   MRA neck Unremarkable   Neuro improved overnight,  low suspicious for CNS infection, will hold off LP at this time.   clopidogrel 75 mg daily prior to admission, OK to resume plavix once po access.   Hx stroke/TIA  09/2016 admitted after a fall with right-sided weakness.  MRI L BG / posterior corona radiata infarct.  EF 55 to 60%.  Carotid Doppler negative.  MRI negative.  LDL 45 and A1c 5.6.   Discharged to CIR with Lipitor and Plavix.  AKI on CKD  Creatinine 2.07-1.8-1.69, improving  On IV fluid  Hypertension  Stable . BP goal normotensive  Hyperlipidemia  Home meds:  lipitor 20  LDL 45 (09/2019), goal < 70 d/t prior stroke  Resume statin once p.o. access  Continue statin at discharge  Other Stroke Risk Factors  Advanced age  Other Active Problems  BPH  Hospital day # 1  Marvel Plan, MD PhD Stroke Neurology 11/03/2019 2:52 PM   I discussed with Dr. Jacqulyn Bath. I spent  35 minutes in total face-to-face time with the patient, more than 50% of which was spent in counseling and coordination of care, reviewing test results, images and medication, and discussing the diagnosis, treatment plan and potential prognosis. This patient's care requiresreview of multiple databases, neurological assessment, discussion with family, other specialists and medical decision making of high complexity.  To contact Stroke Continuity provider, please refer to [email or web address]. After hours, contact General Neurology

## 2019-11-04 ENCOUNTER — Encounter (HOSPITAL_COMMUNITY): Payer: Self-pay | Admitting: Neurology

## 2019-11-04 ENCOUNTER — Inpatient Hospital Stay (HOSPITAL_COMMUNITY): Payer: Medicare HMO

## 2019-11-04 DIAGNOSIS — I6389 Other cerebral infarction: Secondary | ICD-10-CM

## 2019-11-04 LAB — D-DIMER, QUANTITATIVE: D-Dimer, Quant: 1.5 ug/mL-FEU — ABNORMAL HIGH (ref 0.00–0.50)

## 2019-11-04 LAB — COMPREHENSIVE METABOLIC PANEL
ALT: 39 U/L (ref 0–44)
AST: 32 U/L (ref 15–41)
Albumin: 2.3 g/dL — ABNORMAL LOW (ref 3.5–5.0)
Alkaline Phosphatase: 66 U/L (ref 38–126)
Anion gap: 14 (ref 5–15)
BUN: 34 mg/dL — ABNORMAL HIGH (ref 8–23)
CO2: 20 mmol/L — ABNORMAL LOW (ref 22–32)
Calcium: 8.2 mg/dL — ABNORMAL LOW (ref 8.9–10.3)
Chloride: 112 mmol/L — ABNORMAL HIGH (ref 98–111)
Creatinine, Ser: 1.94 mg/dL — ABNORMAL HIGH (ref 0.61–1.24)
GFR calc Af Amer: 39 mL/min — ABNORMAL LOW (ref >60)
GFR calc non Af Amer: 33 mL/min — ABNORMAL LOW (ref >60)
Glucose, Bld: 311 mg/dL — ABNORMAL HIGH (ref 70–99)
Potassium: 4.2 mmol/L (ref 3.5–5.1)
Sodium: 146 mmol/L — ABNORMAL HIGH (ref 135–145)
Total Bilirubin: 1.4 mg/dL — ABNORMAL HIGH (ref 0.3–1.2)
Total Protein: 6.2 g/dL — ABNORMAL LOW (ref 6.5–8.1)

## 2019-11-04 LAB — FERRITIN: Ferritin: 791 ng/mL — ABNORMAL HIGH (ref 24–336)

## 2019-11-04 LAB — CBC WITH DIFFERENTIAL/PLATELET
Abs Immature Granulocytes: 0.14 10*3/uL — ABNORMAL HIGH (ref 0.00–0.07)
Basophils Absolute: 0 10*3/uL (ref 0.0–0.1)
Basophils Relative: 0 %
Eosinophils Absolute: 0 10*3/uL (ref 0.0–0.5)
Eosinophils Relative: 0 %
HCT: 44.7 % (ref 39.0–52.0)
Hemoglobin: 15.1 g/dL (ref 13.0–17.0)
Immature Granulocytes: 1 %
Lymphocytes Relative: 1 %
Lymphs Abs: 0.2 10*3/uL — ABNORMAL LOW (ref 0.7–4.0)
MCH: 30.6 pg (ref 26.0–34.0)
MCHC: 33.8 g/dL (ref 30.0–36.0)
MCV: 90.7 fL (ref 80.0–100.0)
Monocytes Absolute: 0.5 10*3/uL (ref 0.1–1.0)
Monocytes Relative: 2 %
Neutro Abs: 18.9 10*3/uL — ABNORMAL HIGH (ref 1.7–7.7)
Neutrophils Relative %: 96 %
Platelets: 171 10*3/uL (ref 150–400)
RBC: 4.93 MIL/uL (ref 4.22–5.81)
RDW: 14.2 % (ref 11.5–15.5)
WBC: 19.7 10*3/uL — ABNORMAL HIGH (ref 4.0–10.5)
nRBC: 0 % (ref 0.0–0.2)

## 2019-11-04 LAB — CBC
HCT: 46.7 % (ref 39.0–52.0)
Hemoglobin: 15.8 g/dL (ref 13.0–17.0)
MCH: 30.6 pg (ref 26.0–34.0)
MCHC: 33.8 g/dL (ref 30.0–36.0)
MCV: 90.5 fL (ref 80.0–100.0)
Platelets: 177 10*3/uL (ref 150–400)
RBC: 5.16 MIL/uL (ref 4.22–5.81)
RDW: 14.2 % (ref 11.5–15.5)
WBC: 22.6 10*3/uL — ABNORMAL HIGH (ref 4.0–10.5)
nRBC: 0 % (ref 0.0–0.2)

## 2019-11-04 LAB — HEMOGLOBIN A1C
Hgb A1c MFr Bld: 6.2 % — ABNORMAL HIGH (ref 4.8–5.6)
Mean Plasma Glucose: 131.24 mg/dL

## 2019-11-04 LAB — HIV ANTIBODY (ROUTINE TESTING W REFLEX): HIV Screen 4th Generation wRfx: NONREACTIVE

## 2019-11-04 LAB — ECHOCARDIOGRAM COMPLETE
Area-P 1/2: 2.87 cm2
Height: 67 in
Weight: 2786.61 oz

## 2019-11-04 LAB — TROPONIN I (HIGH SENSITIVITY): Troponin I (High Sensitivity): 25 ng/L — ABNORMAL HIGH (ref <18)

## 2019-11-04 LAB — MRSA PCR SCREENING: MRSA by PCR: NEGATIVE

## 2019-11-04 LAB — PHOSPHORUS: Phosphorus: 3.4 mg/dL (ref 2.5–4.6)

## 2019-11-04 LAB — C-REACTIVE PROTEIN: CRP: 15.9 mg/dL — ABNORMAL HIGH (ref <1.0)

## 2019-11-04 LAB — PROCALCITONIN: Procalcitonin: 0.78 ng/mL

## 2019-11-04 LAB — MAGNESIUM: Magnesium: 2.4 mg/dL (ref 1.7–2.4)

## 2019-11-04 LAB — GLUCOSE, CAPILLARY: Glucose-Capillary: 374 mg/dL — ABNORMAL HIGH (ref 70–99)

## 2019-11-04 MED ORDER — ATORVASTATIN CALCIUM 10 MG PO TABS
20.0000 mg | ORAL_TABLET | Freq: Every day | ORAL | Status: DC
  Administered 2019-11-04 – 2019-11-13 (×10): 20 mg via ORAL
  Filled 2019-11-04 (×9): qty 2

## 2019-11-04 MED ORDER — SODIUM CHLORIDE 0.9 % IV BOLUS: 500.0000 mL | Freq: Once | INTRAVENOUS | Status: DC

## 2019-11-04 MED ORDER — IOHEXOL 350 MG/ML SOLN
100.0000 mL | Freq: Once | INTRAVENOUS | Status: AC | PRN
  Administered 2019-11-04: 100 mL via INTRAVENOUS

## 2019-11-04 MED ORDER — SODIUM CHLORIDE 0.45 % IV SOLN
INTRAVENOUS | Status: DC
  Administered 2019-11-04: 1000 mL via INTRAVENOUS

## 2019-11-04 MED ORDER — METOPROLOL SUCCINATE ER 50 MG PO TB24
50.0000 mg | ORAL_TABLET | Freq: Every day | ORAL | Status: DC
  Administered 2019-11-04 – 2019-11-06 (×3): 50 mg via ORAL
  Filled 2019-11-04 (×4): qty 1

## 2019-11-04 MED ORDER — CLOPIDOGREL BISULFATE 75 MG PO TABS
75.0000 mg | ORAL_TABLET | Freq: Every day | ORAL | Status: DC
  Administered 2019-11-04 – 2019-11-08 (×5): 75 mg via ORAL
  Filled 2019-11-04 (×7): qty 1

## 2019-11-04 MED ORDER — INSULIN ASPART 100 UNIT/ML ~~LOC~~ SOLN
0.0000 [IU] | Freq: Every day | SUBCUTANEOUS | Status: DC
  Administered 2019-11-04: 5 [IU] via SUBCUTANEOUS
  Administered 2019-11-05: 3 [IU] via SUBCUTANEOUS

## 2019-11-04 MED ORDER — HEPARIN SODIUM (PORCINE) 5000 UNIT/ML IJ SOLN
5000.0000 [IU] | Freq: Three times a day (TID) | INTRAMUSCULAR | Status: DC
  Administered 2019-11-04 – 2019-11-08 (×12): 5000 [IU] via SUBCUTANEOUS
  Filled 2019-11-04 (×13): qty 1

## 2019-11-04 MED ORDER — INSULIN ASPART 100 UNIT/ML ~~LOC~~ SOLN
0.0000 [IU] | Freq: Three times a day (TID) | SUBCUTANEOUS | Status: DC
  Administered 2019-11-05: 2 [IU] via SUBCUTANEOUS
  Administered 2019-11-05: 3 [IU] via SUBCUTANEOUS
  Administered 2019-11-05 – 2019-11-06 (×3): 5 [IU] via SUBCUTANEOUS

## 2019-11-04 MED ORDER — AZITHROMYCIN 500 MG PO TABS
500.0000 mg | ORAL_TABLET | Freq: Every day | ORAL | Status: AC
  Administered 2019-11-04 – 2019-11-08 (×5): 500 mg via ORAL
  Filled 2019-11-04 (×5): qty 1

## 2019-11-04 MED ORDER — SODIUM CHLORIDE 0.9 % IV SOLN
2.0000 g | INTRAVENOUS | Status: AC
  Administered 2019-11-04 – 2019-11-08 (×5): 2 g via INTRAVENOUS
  Filled 2019-11-04 (×5): qty 2

## 2019-11-04 MED ORDER — AMLODIPINE BESYLATE 10 MG PO TABS
10.0000 mg | ORAL_TABLET | Freq: Every day | ORAL | Status: DC
  Administered 2019-11-04 – 2019-11-13 (×10): 10 mg via ORAL
  Filled 2019-11-04 (×11): qty 1

## 2019-11-04 MED ORDER — FINASTERIDE 5 MG PO TABS
5.0000 mg | ORAL_TABLET | Freq: Every day | ORAL | Status: DC
  Administered 2019-11-04 – 2019-11-13 (×10): 5 mg via ORAL
  Filled 2019-11-04 (×11): qty 1

## 2019-11-04 NOTE — Evaluation (Signed)
Speech Language Pathology Evaluation Patient Details Name: Robert Buckley MRN: [id number] DOB: [birth year] Today's Date: 11/04/2019 Time: 1015-1040 SLP Time Calculation (min) (ACUTE ONLY): 25 min  Problem List:  Patient Active Problem List   Diagnosis Date Noted  . Acute metabolic encephalopathy 11/03/2019  . Acute hypoxemic respiratory failure (HCC) 11/03/2019  . ICH (intracerebral hemorrhage) (HCC) 11/24/2019  . Pneumonia due to COVID-19 virus 10/25/2019  . Urinary incontinence   . Urinary frequency   . Benign essential HTN   . Right spastic hemiparesis (HCC)   . Stage 3 chronic kidney disease   . Left sided cerebral hemisphere cerebrovascular accident (CVA) (HCC) 10/01/2016  . Right hemiparesis (HCC) 10/01/2016  . Acute CVA (cerebrovascular accident) (HCC) 09/30/2016  . Dyslipidemia 09/30/2016  . Hypomagnesemia 09/30/2016  . Acute on chronic kidney failure (HCC) 09/30/2016  . Hypokalemia 09/30/2016  . Fall at home, initial encounter 09/30/2016  . Ataxia 09/30/2016  . Essential hypertension   . UTI (urinary tract infection) 09/28/2016   Past Medical History:  Past Medical History:  Diagnosis Date  . Arthritis   . Hypertension   . Stroke [facility]) 2008   residual R sided weakness   Past Surgical History: History reviewed. No pertinent surgical history. HPI:  74yo male admitted 11/24/19 with RUE weakness. 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 MRI = no acute abnormality. ST on CIR 2018 - memory, problem solving, attention, safety   Assessment / Plan / Recommendation Clinical Impression  Pt was seen at bedside for cognitive linguistic evaluation. Pt has cognitive deficits at baseline, per CIR SLP notes from rehab stay in 2018. He reports living with 2 sons who cared for him around the clock prior to this admit. Pt did not manage medications or finances, he did not drive.   The [employer] Mental Status (SLUMS) Examination was  administered today. Pt frequently responded with "I don't know, I can't remember", and no family was present to discuss baseline level of function. Pt reports "watching TV, walking around, and eating out" as typical activities prior to this admission. Pt scored 4/30 on SLUMS, confirming known cognitive impairment. Pt exhibited difficulty on orientation to time, immediate and delayed recall, mental math, thought organization, digit reversal, clock drawing, and auditory attention and recall.  24 hour supervision continues to be recommended at DC. In the absence of new neuro event per MRI results, pt is likely at or near baseline level of function. Acute ST services not recommended at this time. Recommend follow up at next venue if needs arise.    SLP Assessment  SLP Recommendation/Assessment: All further Speech Language Pathology needs can be addressed in the next venue of care  SLP Visit Diagnosis: Cognitive communication deficit (R41.841)    Follow Up Recommendations  24 hour supervision/assistance (ST referral at next level of care if needs arise.)       SLP Evaluation Cognition  Overall Cognitive Status: History of cognitive impairments - at baseline Arousal/Alertness: Awake/alert Orientation Level: Oriented to person;Oriented to place;Disoriented to time;Disoriented to situation Attention: Focused;Sustained Focused Attention: Appears intact Sustained Attention: Impaired Sustained Attention Impairment: Verbal basic;Functional basic Memory: Impaired Memory Impairment: Storage deficit;Retrieval deficit;Decreased recall of new information;Decreased short term memory;Prospective memory       Comprehension  Auditory Comprehension Overall Auditory Comprehension: Appears within functional limits for tasks assessed    Expression Expression Primary Mode of Expression: Verbal Verbal Expression Overall Verbal Expression: Appears within functional limits for tasks assessed Written  Expression Dominant  Hand: Right   Oral / Motor  Oral Motor/Sensory Function Overall Oral Motor/Sensory Function: Within functional limits Motor Speech Overall Motor Speech: Appears within functional limits for tasks assessed   GO                   [name], [facility], CCC-SLP Speech Language Pathologist Office: [phone]  [facility] [place] 11/04/2019, 10:52 AM

## 2019-11-04 NOTE — Progress Notes (Signed)
STROKE TEAM PROGRESS NOTE   INTERVAL HISTORY No RN or family at bedside. Pt in COVID unit. Awake alert and orientated to self and place, told me age "74" and "October", not able to tell year. Denies HA, N/V but stated that he has no appetite. Neuro stable, no change. No more fever since yesterday.    Vitals:   11/04/19 0046 11/04/19 0305 11/04/19 1212 11/04/19 1534  BP: (!) 137/96 (!) 145/87 (!) 160/102   Pulse: 92 83 (!) 107 (!) 102  Resp: 19 19 20 20   Temp: 99.2 F (37.3 C) 98.7 F (37.1 C) 98.4 F (36.9 C) 98 F (36.7 C)  TempSrc: Oral Oral Oral Axillary  SpO2: 92% 93%  92%  Weight:  79 kg    Height:       CBC:  Recent Labs  Lab 10/21/2019 1711 11/06/2019 1724 11/03/19 0833 11/04/19 1335  WBC 17.0*   < > 14.4* 19.7*  NEUTROABS 14.9*  --   --  18.9*  HGB 16.0   < > 14.4 15.1  HCT 47.7   < > 43.5 44.7  MCV 91.7   < > 95.4 90.7  PLT 179   < > 122* 171   < > = values in this interval not displayed.   Basic Metabolic Panel:  Recent Labs  Lab 11/03/19 0833 11/03/19 2048 11/04/19 1335  NA 143 143  --   K 3.4* 4.7  --   CL 112* 109  --   CO2 20* 19*  --   GLUCOSE 138* 184*  --   BUN 23 27*  --   CREATININE 1.69* 1.97*  --   CALCIUM 6.7* 8.0*  --   MG  --  2.2 2.4  PHOS  --   --  3.4   Lipid Panel:  Recent Labs  Lab 10/26/2019 2220  TRIG 75   HgbA1c: No results for input(s): HGBA1C in the last 168 hours. Urine Drug Screen:  Recent Labs  Lab 11/07/2019 1845  LABOPIA NONE DETECTED  COCAINSCRNUR NONE DETECTED  LABBENZ NONE DETECTED  AMPHETMU NONE DETECTED  THCU NONE DETECTED  LABBARB NONE DETECTED    Alcohol Level  Recent Labs  Lab 11/05/2019 1711  ETH <10    IMAGING past 24 hours ECHOCARDIOGRAM COMPLETE  Result Date: 11/04/2019    ECHOCARDIOGRAM REPORT   Patient Name:   TIBURCIO LINDER Date of Exam: 11/04/2019 Medical Rec #:  11/06/2019      Height:       67.0 in Accession #:    [id number]     Weight:       174.2 lb Date of Birth:  06/29/1945     BSA:           1.907 m Patient Age:    74 years       BP:           160/102 mmHg Patient Gender: M              HR:           97 bpm. Exam Location:  Inpatient Procedure: 2D Echo Indications:    Elevated brain natriuretic peptide (BNP) level  History:        Patient has prior history of Echocardiogram examinations, most                 recent 09/30/2016. Stroke; Risk Factors:Hypertension.  Sonographer:    10/02/2016 RDCS (AE) Referring Phys: [phone] [id number]  Sonographer  Comments: No parasternal window. Image acquisition challenging due to respiratory motion. restricted mobility IMPRESSIONS  1. Left ventricular ejection fraction, by estimation, is 60 to 65%. The left ventricle has normal function. The left ventricle has no regional wall motion abnormalities. Left ventricular diastolic parameters are consistent with Grade I diastolic dysfunction (impaired relaxation).  2. Right ventricular systolic function was not well visualized. The right ventricular size is not well visualized.  3. The mitral valve was not well visualized. No evidence of mitral valve regurgitation.  4. The aortic valve was not well visualized. Aortic valve regurgitation is not visualized. FINDINGS  Left Ventricle: Left ventricular ejection fraction, by estimation, is 60 to 65%. The left ventricle has normal function. The left ventricle has no regional wall motion abnormalities. The left ventricular internal cavity size was normal in size. There is  no left ventricular hypertrophy. Left ventricular diastolic parameters are consistent with Grade I diastolic dysfunction (impaired relaxation). Right Ventricle: The right ventricular size is not well visualized. Right vetricular wall thickness was not assessed. Right ventricular systolic function was not well visualized. Left Atrium: Left atrial size was normal in size. Right Atrium: Right atrial size was not assessed. Pericardium: There is no evidence of pericardial effusion. Mitral Valve: The  mitral valve was not well visualized. No evidence of mitral valve regurgitation. Tricuspid Valve: The tricuspid valve is not well visualized. Tricuspid valve regurgitation is not demonstrated. Aortic Valve: The aortic valve was not well visualized. Aortic valve regurgitation is not visualized. Pulmonic Valve: The pulmonic valve was not well visualized. Pulmonic valve regurgitation is not visualized. Aorta: The aortic root was not well visualized. IAS/Shunts: The interatrial septum was not assessed.   Diastology LV e' medial:    6.53 cm/s LV E/e' medial:  9.7 LV e' lateral:   6.64 cm/s LV E/e' lateral: 9.5  LEFT ATRIUM           Index LA Vol (A2C): 23.0 ml 12.06 ml/m LA Vol (A4C): 18.4 ml 9.65 ml/m  AORTIC VALVE LVOT Vmax:   90.10 cm/s LVOT Vmean:  72.700 cm/s LVOT VTI:    0.180 m MITRAL VALVE MV Area (PHT): 2.87 cm    SHUNTS MV Decel Time: 264 msec    Systemic VTI: 0.18 m MV E velocity: 63.40 cm/s MV A velocity: 71.60 cm/s MV E/A ratio:  0.89 Kristeen Miss MD Electronically signed by Kristeen Miss MD Signature Date/Time: 11/04/2019/3:20:05 PM    Final     PHYSICAL EXAM  Temp:  [98 F (36.7 C)-99.2 F (37.3 C)] 98 F (36.7 C) (09/23 1534) Pulse Rate:  [60-107] 102 (09/23 1534) Resp:  [15-24] 20 (09/23 1534) BP: (123-160)/(82-102) 160/102 (09/23 1212) SpO2:  [90 %-93 %] 92 % (09/23 1534) Weight:  [79 kg] 79 kg (09/23 0305)  General - Well nourished, well developed, in no apparent distress.  Ophthalmologic - fundi not visualized due to noncooperation.  Cardiovascular - Regular rhythm and rate.  Neuro - awake alert eyes open. Orientated to place and self, but not to age or time. No aphasia, spontaneous speech, fluent, following simple commands, able to name and repeat. PERRL, EOMI, visual field full. Right facial droop and tongue midline. Right UE proximal 0/5 but lack of effort, bicep 3/5 and tricep 3/5 but lack of effort and finger grip 3/5. right LE proximal 0/5, knee flexion 2/5 and distal  DF/PF 0/5 but again lack of effort. LUE 4+/5 and LLE 4/5 at least. Sensation subjectively symmetrical. FTN on the left grossly intact. Gait not  tested.   ASSESSMENT/PLAN Mr. Pelham Hennick is a 74 y.o. male with history of HTN, stroke (2010) w/ residual R hemiparesis, COVID infection w/ recent admission 10/24/19-10/27/19, CKD IIIa, HLD presenting with confusion, increased R arm weakness and hypoxia.   COVID PNA w/ hypoxia  Recent COVID dx w/ hospitalization 9/12-9/15/2021 for PNA, treated w/ Remdesivir (3 IP, 2 OP)  Tmax 101.2, tachypneic, hypoxic  CXR R lung infiltrates, increased from prior  WBC 17->14.4->19.7  D-dimer 1.08->1.50  BNP 1685.2  LDH 348  Ferretin 433->791  Fibrinogen 189->730  CRP 1.6->11.2->15.9  Lactic acid 2.1  Procalcitonin 0.78  UA neg  Medical Hospitalist on board  On rocephin   Recrudescence of chronic L thalamic infarct   09/2016 MRI L white matter / posterior corona radiata infarct   Code Stroke CT head new L thalamocapsular jxn hyperdensity new from 10/23/2019 possible IPH or mineralization. No acute cortical infarct. Unchanged Small vessel disease. Atrophy. Sinus dz.  MRI  No acute abnormality. Small vessel disease. Atrophy. Few scattered old B basal ganglia, thalamic and cerebral white matter lacunes. L thalamic mineralization/calcification, no ICH.    MRA head Unremarkable   MRA neck Unremarkable   Low suspicious for CNS infection, will hold off LP at this time.   clopidogrel 75 mg daily prior to admission, now on plavix. Continue on discharge  Hx stroke/TIA  09/2016 admitted after a fall with right-sided weakness.  MRI L BG / posterior corona radiata infarct.  EF 55 to 60%.  Carotid Doppler negative.  MRI negative.  LDL 45 and A1c 5.6.  Discharged to CIR with Lipitor and Plavix.  AKI on CKD  Creatinine 2.07-1.8-1.69->1.97   On IV fluid @ 75  Hypertension  Stable . BP goal normotensive  Hyperlipidemia  Home meds:  lipitor  20  LDL 45 (09/2019), goal < 70 d/t prior stroke  Resumed lipitor  Continue statin at discharge  Other Stroke Risk Factors  Advanced age  Other Active Problems  BPH  Hospital day # 2  Neurology will sign off. Please call with questions. Pt will follow up with stroke clinic NP at [facility] in about 4 weeks. Thanks for the consult.   Marvel Plan, MD PhD Stroke Neurology 11/04/2019 3:43 PM   To contact Stroke Continuity provider, please refer to [email or web address]. After hours, contact General Neurology

## 2019-11-04 NOTE — Progress Notes (Addendum)
PROGRESS NOTE    Robert Buckley  ZOX:[id number] DOB: May 23, 1945 DOA: 11/01/2019 PCP: Shelda Jakes, PA-C   Chief Complaint  Patient presents with  . Code Stroke    Brief Narrative:  Robert Buckley is an 74 y.o. male with past medical history of hypertension, CVA with residual right-sided weakness, CKD stage IIIa, obese, hyperlipidemia, recent COVID-19 infection presents to emergency department with right arm weakness.   Patient presented to ER with confusion and worsening right-sided weakness at 3 PM.  EMS noted patient's oxygen saturation 90% was placed on 2 L of oxygen via nasal cannula.  Code stroke was activated and patient was evaluated by neurology.  Initial CT concerning for thalamus hemorrhage however MRI brain came back negative shows findings most consistent with small focus of mineralization/calcification.  No intracranial hemorrhage.  MRA head and neck came back negative for acute findings.  Initially his confusion was thought to be related to ICH.   He denies alcohol, smoking, illicit drug use.  Of note: Patient admitted on 9/13 with acute hypoxemic respiratory failure secondary to COVID-19 pneumonia and discharged home in stable condition on 9/15.  He received 3 out of 5 doses of remdesivir while he was hospitalized and received 2 remaining doses outpatient.  He discharged home on 10 days of steroids.  Upon arrival to ED: Patient had fever of 101.2, tachypneic, hypoxic requiring 2 L of oxygen via nasal cannula, lactic acid: 2.1, WBC: 17,000, PCT: 0.44, D-dimer: 1.08, UA positive for rare bacteria.  CMP shows worsening kidney function.  Chest x-ray shows mild right lung infiltrate increased when compared to prior study.  Patient received Rocephin and azithromycin in ED.  [facility] hospitalist consulted for acute hypoxemic respiratory failure due to COVID-19 pneumonia.  Assessment & Plan:   Principal Problem:   Acute metabolic encephalopathy Active Problems:   Essential  hypertension   Dyslipidemia   Acute on chronic kidney failure (HCC)   Right hemiparesis (HCC)   Pneumonia due to COVID-19 virus   Acute hypoxemic respiratory failure (HCC)  Addendum: seen 9/23 pm due to increased O2 requirement, now on 5 L, tachycardic.  Follow CT PE protocol, stat.  Pt denies pain or discomfort at this time.    Acute metabolic encephalopathy  Recrudescence of Chronic L Thalamic Infarct: -Likely in the setting of COVID-19 pneumonia - MRI brain without acute intracranial abnormality.  No acute intracranial hemorrhage.  Moderately advanced age related cerebral atrophy with chronic microvascular ischemic disease with Robert Buckley few scattered remote lacunar infarcts about the bilateral basal ganglia, thalami, and hemispheric cerebral white matter.  Negative intracranial MRA.  Negative MRA of neck. - he's Robert Buckley&Ox3 today, seems to have improved - continue delirium precautions - UA not c/w UTI, follow blood cultures - B12, folate, ammonia, RPR not collected as he's been refusing labs.  Negative urine tox, ethanol levels - seen initially by neurology for code stroke, but MRI with no acute abnormality and he's since improved - suspected 2/2 covid encephalopathy and recrudescence of his chronic L thalamic infarct - pt/ot/slp eval, appreciate recs  COVID 19 Pneumonia  Possible Bacterial Pneumonia  Fever:  Recently treated for covid with steroids and remdesivir (2 days completed outpatient) He's been restarted on steroids and baricitinib He's been weaned to RA today Fever noted 9/22 AM Procalcitonin is 0.44 CRP has risen since recent discharge Will continue steroids, I think baricitinib can be discontinued in setting of minimal oxygen requirement Given mildly elevated procalcitonin, will start abx for possible bacterial pneumonia Follow blood cx,  sputum cx, MRSA pcr Prone as able, OOB, IS, flutter  COVID-19 Labs  Recent Labs    Nov 19, 2019 2221 11/03/19 2048 11/04/19 1335  DDIMER  1.08* 0.90*  --   FERRITIN 433*  --  791*  LDH 348* 307*  --   CRP 11.2*  --  15.9*    Lab Results  Component Value Date   SARSCOV2NAA POSITIVE ([name]) 10/24/2019   AKI on CKD stage IIIb: -Creatinine: 2.07, GFR: 36 (baseline creatinine: 1.62, GFR: 48) -follow pending labs for today, w/u further as indicated  Elevated BNP  Elevated Troponin:  BNP significantly elevated, ? Related to CKD/AKI Elevated troponin, mild, suspect 2/2 demand in setting of above.  Delta < 20. Follow echocardiogram   Leukocytosis: Likely secondary to recent steroid use vs possible bacterial infection Continue antibiotics  Hypokalemia: follow, improved  History of stroke with right-sided residual weakness: -Can resume home p.o. meds-statin, plavix  Hypertension: Stable -Continue home meds amlodipine and metoprolol  Sinus Tachycardia:  - ? Rebound tachy while holding metop, follow  BPH: Continue Proscar  Thrombocytopenia: Platelet: 122. -No signs of active bleeding.  Repeat CBC tomorrow AM.  DVT prophylaxis: heparin Code Status: full  Family Communication: none at bedside Disposition:   Status is: Inpatient  Remains inpatient appropriate because:Inpatient level of care appropriate due to severity of illness   Dispo: The patient is from: Home              Anticipated d/c is to: pending              Anticipated d/c date is: > 3 days              Patient currently is not medically stable to d/c.   Consultants:   neurology  Procedures:   none  Antimicrobials: ( Anti-infectives (From admission, onward)   Start     Dose/Rate Route Frequency Ordered Stop   11/04/19 1600  cefTRIAXone (ROCEPHIN) 2 g in sodium chloride 0.9 % 100 mL IVPB        2 g 200 mL/hr over 30 Minutes Intravenous Every 24 hours 11/04/19 1445 11/09/19 1559   11/04/19 1600  azithromycin (ZITHROMAX) tablet 500 mg        500 mg Oral Daily 11/04/19 1445 11/09/19 0959   Nov 19, 2019 1830  cefTRIAXone (ROCEPHIN) 1 g in  sodium chloride 0.9 % 100 mL IVPB        1 g 200 mL/hr over 30 Minutes Intravenous  Once 2019-11-19 1821 11/19/2019 1937   Nov 19, 2019 1830  azithromycin (ZITHROMAX) 500 mg in sodium chloride 0.9 % 250 mL IVPB        500 mg 250 mL/hr over 60 Minutes Intravenous  Once 11/19/2019 1821 11-19-19 2221         Subjective: Britani Beattie&Ox3 No new complaints Doesn't like needles, doesn't want blood draw  Objective: Vitals:   11/03/19 2018 11/04/19 0046 11/04/19 0305 11/04/19 1212  BP: (!) 135/95 (!) 137/96 (!) 145/87 (!) 160/102  Pulse: 85 92 83 (!) 107  Resp: 15 19 19 20   Temp: 98.9 F (37.2 C) 99.2 F (37.3 C) 98.7 F (37.1 C) 98.4 F (36.9 C)  TempSrc: Rectal Oral Oral Oral  SpO2: 91% 92% 93%   Weight:   79 kg   Height: 5\' 7"  (1.702 m)       Intake/Output Summary (Last 24 hours) at 11/04/2019 1433 Last data filed at 11/04/2019 0830 Gross per 24 hour  Intake 240 ml  Output 500 ml  Net -  260 ml   Filed Weights   11/04/19 0305  Weight: 79 kg    Examination:  General exam: Appears calm and comfortable  Respiratory system: Clear to auscultation. Respiratory effort normal. Cardiovascular system: S1 & S2 heard, tachycardic. Gastrointestinal system: Abdomen is nondistended, soft and nontender. Central nervous system: Alert and oriented. R sided weakness, R sided facial droop. Extremities: no LEE Skin: No rashes, lesions or ulcers Psychiatry: Judgement and insight appear normal. Mood & affect appropriate.     Data Reviewed: I have personally reviewed following labs and imaging studies  CBC: Recent Labs  Lab 11/08/2019 1711 11/01/2019 1724 11/03/19 0833 11/04/19 1335  WBC 17.0*  --  14.4* 19.7*  NEUTROABS 14.9*  --   --  18.9*  HGB 16.0 15.6 14.4 15.1  HCT 47.7 46.0 43.5 44.7  MCV 91.7  --  95.4 90.7  PLT 179  --  122* 171    Basic Metabolic Panel: Recent Labs  Lab 11/08/2019 1711 10/23/2019 1724 11/03/19 0833 11/03/19 2048 11/04/19 1335  NA 146* 145 143 143  --   K 3.6 3.7 3.4*  4.7  --   CL 109 110 112* 109  --   CO2 22  --  20* 19*  --   GLUCOSE 153* 156* 138* 184*  --   BUN 27* 34* 23 27*  --   CREATININE 2.07* 1.80* 1.69* 1.97*  --   CALCIUM 8.0*  --  6.7* 8.0*  --   MG  --   --   --  2.2 2.4  PHOS  --   --   --   --  3.4    GFR: Estimated Creatinine Clearance: 31.2 mL/min (Desirai Traxler) (by C-G formula based on SCr of 1.97 mg/dL (H)).  Liver Function Tests: Recent Labs  Lab 11/04/2019 1711 11/03/19 0833 11/03/19 2048  AST 29 22 23   ALT 36 31 36  ALKPHOS 74 56 73  BILITOT 2.1* 1.6* 1.7*  PROT 6.1* 4.9* 6.2*  ALBUMIN 2.8* 2.1* 2.5*    CBG: Recent Labs  Lab 10/22/2019 1711  GLUCAP 171*     Recent Results (from the past 240 hour(s))  Culture, blood (routine x 2)     Status: None (Preliminary result)   Collection Time: 10/29/2019  6:52 PM   Specimen: BLOOD LEFT ARM  Result Value Ref Range Status   Specimen Description BLOOD LEFT ARM  Final   Special Requests   Final    BOTTLES DRAWN AEROBIC AND ANAEROBIC Blood Culture adequate volume   Culture   Final    NO GROWTH 2 DAYS Performed at [facility], [address], [place], [place]    Report Status PENDING  Incomplete  Culture, blood (routine x 2)     Status: None (Preliminary result)   Collection Time: 11/06/2019  6:53 PM   Specimen: BLOOD  Result Value Ref Range Status   Specimen Description BLOOD LEFT ANTECUBITAL  Final   Special Requests   Final    BOTTLES DRAWN AEROBIC AND ANAEROBIC Blood Culture adequate volume   Culture   Final    NO GROWTH 2 DAYS Performed at [facility], [address] [place], [place]    Report Status PENDING  Incomplete         Radiology Studies: MR ANGIO HEAD WO CONTRAST  Result Date: 10/30/2019 CLINICAL DATA:  Initial evaluation for acute stroke, right-sided weakness. EXAM: MRI HEAD WITHOUT CONTRAST MRA HEAD WITHOUT CONTRAST MRA NECK WITHOUT  AND WITH CONTRAST TECHNIQUE: Multiplanar, multiecho pulse sequences of the brain and  surrounding structures were obtained without intravenous contrast. Angiographic images of the Circle of Willis were obtained using MRA technique without intravenous contrast. Angiographic images of the neck were obtained using MRA technique without and with intravenous contrast. Carotid stenosis measurements (when applicable) are obtained utilizing NASCET criteria, using the distal internal carotid diameter as the denominator. CONTRAST:  8mL GADAVIST GADOBUTROL 1 MMOL/ML IV SOLN COMPARISON:  Comparison made with prior head CT from earlier the same day. FINDINGS: MRI HEAD FINDINGS Brain: Examination moderately degraded by motion artifact. Diffuse prominence of the CSF containing spaces compatible with generalized age-related cerebral atrophy, moderately advanced for age. Patchy and confluent T2/FLAIR hyperintensity within the periventricular and deep white matter both cerebral hemispheres most consistent with chronic small vessel ischemic disease, moderate in nature. Patchy involvement of the pons noted. Multiple scattered remote lacunar infarcts present about the bilateral basal ganglia, thalami, and hemispheric cerebral white matter. No abnormal foci of restricted diffusion to suggest acute or subacute ischemia. Gray-white matter differentiation maintained. No encephalomalacia to suggest chronic cortical infarction. No acute intracranial hemorrhage. Few scattered punctate foci of susceptibility artifact about the right thalamus and bilateral cerebral hemispheres, most consistent with chronic microhemorrhages, likely small vessel related. Note made of Eldwin Volkov 5 mm focus of susceptibility artifact at the left thalamus, corresponding with previously noted hyperdensity on prior CT. This appears to correspond with calcification/mineralization on phase sequence. No mass lesion, midline shift, or mass effect. Diffuse ventricular prominence related to global parenchymal volume loss without hydrocephalus. No extra-axial fluid  collection. Pituitary gland suprasellar region grossly within normal limits. Midline structures intact. Vascular: Major intracranial vascular flow voids are maintained. Skull and upper cervical spine: Craniocervical junction within normal limits. Bone marrow signal intensity grossly normal. No visible scalp soft tissue abnormality. Sinuses/Orbits: Globes and orbital soft tissues demonstrate no acute finding. Moderate mucosal thickening noted throughout the ethmoidal air cells and maxillary sinuses. Superimposed air-fluid levels within the maxillary sinuses suggest acute sinusitis, right greater than left. No mastoid effusion. Other: None. MRA HEAD FINDINGS ANTERIOR CIRCULATION: Visualized distal cervical segments of the internal carotid arteries are patent with antegrade flow. Petrous segments patent bilaterally. Short-segment mild stenosis noted at the proximal cavernous right ICA (series 2, image 102). Cavernous and supraclinoid ICAs otherwise widely patent without hemodynamically significant stenosis. A1 segments patent bilaterally. Normal anterior communicating artery complex. Anterior cerebral arteries widely patent to their distal aspects without stenosis. No M1 stenosis or occlusion. Normal MCA bifurcations. Distal MCA branches well perfused and symmetric. POSTERIOR CIRCULATION: Vertebral arteries widely patent to the vertebrobasilar junction without stenosis. Left vertebral artery dominant. Partially visualized left PICA appears patent. Right PICA not seen. Basilar widely patent to its distal aspect without stenosis. Superior cerebral arteries patent bilaterally. Both PCAs primarily supplied via the basilar well perfused to their distal aspects. No intracranial aneurysm. MRA NECK FINDINGS AORTIC ARCH: Visualized aortic arch of normal caliber with normal 3 vessel morphology. No hemodynamically significant stenosis seen about the origin of the great vessels. RIGHT CAROTID SYSTEM: Right CCA widely patent from  its origin to the bifurcation without stenosis. Mild atheromatous irregularity about the right bifurcation/proximal right ICA without hemodynamically significant stenosis. Right ICA widely patent distally to the skull base without stenosis or occlusion. LEFT CAROTID SYSTEM: Left common carotid artery widely patent from its origin to the bifurcation without stenosis. No significant atheromatous narrowing or irregularity about the left bifurcation. Left ICA widely patent distally to the skull  base without stenosis or occlusion. VERTEBRAL ARTERIES: Both vertebral arteries arise from the subclavian arteries. No proximal subclavian artery stenosis. Left vertebral artery dominant. Both vertebral arteries widely patent within the neck without stenosis or occlusion. IMPRESSION: MRI HEAD IMPRESSION: 1. No acute intracranial abnormality. 2. Moderately advanced age-related cerebral atrophy with chronic microvascular ischemic disease, with Berline Semrad few scattered remote lacunar infarcts about the bilateral basal ganglia, thalami, and hemispheric cerebral white matter. 3. Small focus of susceptibility artifact at the left thalamus, corresponding with hyperdensity seen on prior CT. This is most consistent with Taleigha Pinson small focus of mineralization/calcification. No acute intracranial hemorrhage. 4. Acute ethmoidal and maxillary sinusitis, right worse than left. MRA HEAD IMPRESSION: Negative intracranial MRA. No large vessel occlusion or hemodynamically significant stenosis. No aneurysm. MRA NECK IMPRESSION: Negative MRA of the neck. No hemodynamically significant or critical flow limiting stenosis about either carotid artery system. Both vertebral arteries widely patent within the neck. Electronically Signed   By: Rise MuBenjamin  McClintock M.D.   On: 2019-05-22 22:10   MR ANGIO NECK W WO CONTRAST  Result Date: 07-16-2019 CLINICAL DATA:  Initial evaluation for acute stroke, right-sided weakness. EXAM: MRI HEAD WITHOUT CONTRAST MRA HEAD WITHOUT  CONTRAST MRA NECK WITHOUT AND WITH CONTRAST TECHNIQUE: Multiplanar, multiecho pulse sequences of the brain and surrounding structures were obtained without intravenous contrast. Angiographic images of the Circle of Willis were obtained using MRA technique without intravenous contrast. Angiographic images of the neck were obtained using MRA technique without and with intravenous contrast. Carotid stenosis measurements (when applicable) are obtained utilizing NASCET criteria, using the distal internal carotid diameter as the denominator. CONTRAST:  8mL GADAVIST GADOBUTROL 1 MMOL/ML IV SOLN COMPARISON:  Comparison made with prior head CT from earlier the same day. FINDINGS: MRI HEAD FINDINGS Brain: Examination moderately degraded by motion artifact. Diffuse prominence of the CSF containing spaces compatible with generalized age-related cerebral atrophy, moderately advanced for age. Patchy and confluent T2/FLAIR hyperintensity within the periventricular and deep white matter both cerebral hemispheres most consistent with chronic small vessel ischemic disease, moderate in nature. Patchy involvement of the pons noted. Multiple scattered remote lacunar infarcts present about the bilateral basal ganglia, thalami, and hemispheric cerebral white matter. No abnormal foci of restricted diffusion to suggest acute or subacute ischemia. Gray-white matter differentiation maintained. No encephalomalacia to suggest chronic cortical infarction. No acute intracranial hemorrhage. Few scattered punctate foci of susceptibility artifact about the right thalamus and bilateral cerebral hemispheres, most consistent with chronic microhemorrhages, likely small vessel related. Note made of Daizy Outen 5 mm focus of susceptibility artifact at the left thalamus, corresponding with previously noted hyperdensity on prior CT. This appears to correspond with calcification/mineralization on phase sequence. No mass lesion, midline shift, or mass effect. Diffuse  ventricular prominence related to global parenchymal volume loss without hydrocephalus. No extra-axial fluid collection. Pituitary gland suprasellar region grossly within normal limits. Midline structures intact. Vascular: Major intracranial vascular flow voids are maintained. Skull and upper cervical spine: Craniocervical junction within normal limits. Bone marrow signal intensity grossly normal. No visible scalp soft tissue abnormality. Sinuses/Orbits: Globes and orbital soft tissues demonstrate no acute finding. Moderate mucosal thickening noted throughout the ethmoidal air cells and maxillary sinuses. Superimposed air-fluid levels within the maxillary sinuses suggest acute sinusitis, right greater than left. No mastoid effusion. Other: None. MRA HEAD FINDINGS ANTERIOR CIRCULATION: Visualized distal cervical segments of the internal carotid arteries are patent with antegrade flow. Petrous segments patent bilaterally. Short-segment mild stenosis noted at the proximal cavernous right ICA (series 2, image  102). Cavernous and supraclinoid ICAs otherwise widely patent without hemodynamically significant stenosis. A1 segments patent bilaterally. Normal anterior communicating artery complex. Anterior cerebral arteries widely patent to their distal aspects without stenosis. No M1 stenosis or occlusion. Normal MCA bifurcations. Distal MCA branches well perfused and symmetric. POSTERIOR CIRCULATION: Vertebral arteries widely patent to the vertebrobasilar junction without stenosis. Left vertebral artery dominant. Partially visualized left PICA appears patent. Right PICA not seen. Basilar widely patent to its distal aspect without stenosis. Superior cerebral arteries patent bilaterally. Both PCAs primarily supplied via the basilar well perfused to their distal aspects. No intracranial aneurysm. MRA NECK FINDINGS AORTIC ARCH: Visualized aortic arch of normal caliber with normal 3 vessel morphology. No hemodynamically  significant stenosis seen about the origin of the great vessels. RIGHT CAROTID SYSTEM: Right CCA widely patent from its origin to the bifurcation without stenosis. Mild atheromatous irregularity about the right bifurcation/proximal right ICA without hemodynamically significant stenosis. Right ICA widely patent distally to the skull base without stenosis or occlusion. LEFT CAROTID SYSTEM: Left common carotid artery widely patent from its origin to the bifurcation without stenosis. No significant atheromatous narrowing or irregularity about the left bifurcation. Left ICA widely patent distally to the skull base without stenosis or occlusion. VERTEBRAL ARTERIES: Both vertebral arteries arise from the subclavian arteries. No proximal subclavian artery stenosis. Left vertebral artery dominant. Both vertebral arteries widely patent within the neck without stenosis or occlusion. IMPRESSION: MRI HEAD IMPRESSION: 1. No acute intracranial abnormality. 2. Moderately advanced age-related cerebral atrophy with chronic microvascular ischemic disease, with Reisha Wos few scattered remote lacunar infarcts about the bilateral basal ganglia, thalami, and hemispheric cerebral white matter. 3. Small focus of susceptibility artifact at the left thalamus, corresponding with hyperdensity seen on prior CT. This is most consistent with Dayshon Roback small focus of mineralization/calcification. No acute intracranial hemorrhage. 4. Acute ethmoidal and maxillary sinusitis, right worse than left. MRA HEAD IMPRESSION: Negative intracranial MRA. No large vessel occlusion or hemodynamically significant stenosis. No aneurysm. MRA NECK IMPRESSION: Negative MRA of the neck. No hemodynamically significant or critical flow limiting stenosis about either carotid artery system. Both vertebral arteries widely patent within the neck. Electronically Signed   By: Rise Mu M.D.   On: 11/26/2019 22:10   MR BRAIN WO CONTRAST  Result Date: 11-26-19 CLINICAL DATA:   Initial evaluation for acute stroke, right-sided weakness. EXAM: MRI HEAD WITHOUT CONTRAST MRA HEAD WITHOUT CONTRAST MRA NECK WITHOUT AND WITH CONTRAST TECHNIQUE: Multiplanar, multiecho pulse sequences of the brain and surrounding structures were obtained without intravenous contrast. Angiographic images of the Circle of Willis were obtained using MRA technique without intravenous contrast. Angiographic images of the neck were obtained using MRA technique without and with intravenous contrast. Carotid stenosis measurements (when applicable) are obtained utilizing NASCET criteria, using the distal internal carotid diameter as the denominator. CONTRAST:  8mL GADAVIST GADOBUTROL 1 MMOL/ML IV SOLN COMPARISON:  Comparison made with prior head CT from earlier the same day. FINDINGS: MRI HEAD FINDINGS Brain: Examination moderately degraded by motion artifact. Diffuse prominence of the CSF containing spaces compatible with generalized age-related cerebral atrophy, moderately advanced for age. Patchy and confluent T2/FLAIR hyperintensity within the periventricular and deep white matter both cerebral hemispheres most consistent with chronic small vessel ischemic disease, moderate in nature. Patchy involvement of the pons noted. Multiple scattered remote lacunar infarcts present about the bilateral basal ganglia, thalami, and hemispheric cerebral white matter. No abnormal foci of restricted diffusion to suggest acute or subacute ischemia. Gray-white matter differentiation maintained. No encephalomalacia  to suggest chronic cortical infarction. No acute intracranial hemorrhage. Few scattered punctate foci of susceptibility artifact about the right thalamus and bilateral cerebral hemispheres, most consistent with chronic microhemorrhages, likely small vessel related. Note made of Aislin Onofre 5 mm focus of susceptibility artifact at the left thalamus, corresponding with previously noted hyperdensity on prior CT. This appears to correspond  with calcification/mineralization on phase sequence. No mass lesion, midline shift, or mass effect. Diffuse ventricular prominence related to global parenchymal volume loss without hydrocephalus. No extra-axial fluid collection. Pituitary gland suprasellar region grossly within normal limits. Midline structures intact. Vascular: Major intracranial vascular flow voids are maintained. Skull and upper cervical spine: Craniocervical junction within normal limits. Bone marrow signal intensity grossly normal. No visible scalp soft tissue abnormality. Sinuses/Orbits: Globes and orbital soft tissues demonstrate no acute finding. Moderate mucosal thickening noted throughout the ethmoidal air cells and maxillary sinuses. Superimposed air-fluid levels within the maxillary sinuses suggest acute sinusitis, right greater than left. No mastoid effusion. Other: None. MRA HEAD FINDINGS ANTERIOR CIRCULATION: Visualized distal cervical segments of the internal carotid arteries are patent with antegrade flow. Petrous segments patent bilaterally. Short-segment mild stenosis noted at the proximal cavernous right ICA (series 2, image 102). Cavernous and supraclinoid ICAs otherwise widely patent without hemodynamically significant stenosis. A1 segments patent bilaterally. Normal anterior communicating artery complex. Anterior cerebral arteries widely patent to their distal aspects without stenosis. No M1 stenosis or occlusion. Normal MCA bifurcations. Distal MCA branches well perfused and symmetric. POSTERIOR CIRCULATION: Vertebral arteries widely patent to the vertebrobasilar junction without stenosis. Left vertebral artery dominant. Partially visualized left PICA appears patent. Right PICA not seen. Basilar widely patent to its distal aspect without stenosis. Superior cerebral arteries patent bilaterally. Both PCAs primarily supplied via the basilar well perfused to their distal aspects. No intracranial aneurysm. MRA NECK FINDINGS AORTIC  ARCH: Visualized aortic arch of normal caliber with normal 3 vessel morphology. No hemodynamically significant stenosis seen about the origin of the great vessels. RIGHT CAROTID SYSTEM: Right CCA widely patent from its origin to the bifurcation without stenosis. Mild atheromatous irregularity about the right bifurcation/proximal right ICA without hemodynamically significant stenosis. Right ICA widely patent distally to the skull base without stenosis or occlusion. LEFT CAROTID SYSTEM: Left common carotid artery widely patent from its origin to the bifurcation without stenosis. No significant atheromatous narrowing or irregularity about the left bifurcation. Left ICA widely patent distally to the skull base without stenosis or occlusion. VERTEBRAL ARTERIES: Both vertebral arteries arise from the subclavian arteries. No proximal subclavian artery stenosis. Left vertebral artery dominant. Both vertebral arteries widely patent within the neck without stenosis or occlusion. IMPRESSION: MRI HEAD IMPRESSION: 1. No acute intracranial abnormality. 2. Moderately advanced age-related cerebral atrophy with chronic microvascular ischemic disease, with Amarri Satterly few scattered remote lacunar infarcts about the bilateral basal ganglia, thalami, and hemispheric cerebral white matter. 3. Small focus of susceptibility artifact at the left thalamus, corresponding with hyperdensity seen on prior CT. This is most consistent with Rehema Muffley small focus of mineralization/calcification. No acute intracranial hemorrhage. 4. Acute ethmoidal and maxillary sinusitis, right worse than left. MRA HEAD IMPRESSION: Negative intracranial MRA. No large vessel occlusion or hemodynamically significant stenosis. No aneurysm. MRA NECK IMPRESSION: Negative MRA of the neck. No hemodynamically significant or critical flow limiting stenosis about either carotid artery system. Both vertebral arteries widely patent within the neck. Electronically Signed   By: Rise Mu M.D.   On: 11/01/2019 22:10   DG Chest Portable 1 View  Result Date: 11/08/2019  CLINICAL DATA:  COVID positive. EXAM: PORTABLE CHEST 1 VIEW COMPARISON:  October 24, 2019 FINDINGS: Mild infiltrates are seen along the periphery of the right lung. This is increased in severity when compared to the prior study. There is no evidence of Avigdor Dollar pleural effusion or pneumothorax. The heart size and mediastinal contours are within normal limits. The visualized skeletal structures are unremarkable. IMPRESSION: Mild right lung infiltrates, increased in severity when compared to the prior study. Electronically Signed   By: Aram Candela M.D.   On: 2019/11/17 18:02   CT HEAD CODE STROKE WO CONTRAST  Addendum Date: 2019-11-17   ADDENDUM REPORT: 11/17/19 17:44 ADDENDUM: These results were called by telephone at the time of interpretation on 11-17-2019 at 5:38 pm to provider Dr. Iver Nestle, who verbally acknowledged these results. Electronically Signed   By: Jackey Loge DO   On: 11/17/2019 17:44   Result Date: 2019/11/17 CLINICAL DATA:  Code stroke. Neuro deficit, acute, stroke suspected. EXAM: CT HEAD WITHOUT CONTRAST TECHNIQUE: Contiguous axial images were obtained from the base of the skull through the vertex without intravenous contrast. COMPARISON:  Noncontrast head CT 10/24/2019 FINDINGS: Brain: Stable, moderate generalized parenchymal atrophy. As before, there is advanced ill-defined hypoattenuation within the cerebral white matter which is nonspecific, but consistent with chronic small vessel ischemic disease. Redemonstrated multiple chronic lacunar infarcts within the bilateral basal ganglia and left thalamus. New from the prior head CT of 10/24/2019, there is Carnell Casamento 5 mm focus of hyperdensity within the left thalamocapsular junction at site of Mildred Tuccillo chronic lacunar infarct which may reflect Nicolis Boody small parenchymal hemorrhage or mineralization (series 3, image 16) (series 5, image 43). No demarcated cortical infarct  is identified. No extra-axial fluid collection. No evidence of intracranial mass. No midline shift. Vascular: No definite hyperdense vessel. Atherosclerotic calcifications Skull: Normal. Negative for fracture or focal lesion. Sinuses/Orbits: Visualized orbits show no acute finding. Extensive partial opacification of bilateral ethmoid air cells. Frothy secretions and small air-fluid level within the left maxillary sinus. Near complete opacification of the right maxillary sinus. Mild-to-moderate mucosal thickening within the sphenoid sinuses. No significant mastoid effusion. ASPECTS ([place] Stroke Program Early CT Score) - Ganglionic level infarction (caudate, lentiform nuclei, internal capsule, insula, M1-M3 cortex): 7 - Supraganglionic infarction (M4-M6 cortex): 3 Total score (0-10 with 10 being normal): 10 IMPRESSION: New from the prior head CT of 10/24/2019, there is Sherlynn Tourville 5 mm focus of hyperdensity within the left thalamocapsular junction (at site of Zoa Dowty chronic lacunar infarct) which may reflect parenchymal hemorrhage or mineralization. No acute demarcated cortical infarct is identified. Unchanged moderate generalized parenchymal atrophy and advanced cerebral white matter chronic small vessel ischemic disease. Redemonstrated chronic lacunar infarcts within the bilateral basal ganglia and left thalamus. Paranasal sinus disease, most notably ethmoid and maxillary sinusitis. Electronically Signed: By: Jackey Loge DO On: 2019/11/17 17:37        Scheduled Meds: . vitamin C  500 mg Oral Daily  . baricitinib  2 mg Oral Daily  . methylPREDNISolone (SOLU-MEDROL) injection  80 mg Intravenous Q12H   Followed by  . [START ON 11/06/2019] predniSONE  50 mg Oral Daily  . pantoprazole (PROTONIX) IV  40 mg Intravenous QHS  . zinc sulfate  220 mg Oral Daily   Continuous Infusions: . sodium chloride 75 mL/hr at 11/04/19 0313     LOS: 2 days    Time spent: over 30 min    Lacretia Nicks, MD [facility]   To contact the attending provider between 7A-7P or the  covering provider during after hours 7P-7A, please log into the web site [email or web address] and access using universal [place] password for that web site. If you do not have the password, please call the hospital operator.  11/04/2019, 2:33 PM

## 2019-11-04 NOTE — Plan of Care (Signed)

## 2019-11-04 NOTE — Progress Notes (Signed)
  Echocardiogram 2D Echocardiogram has been performed.  Robert Buckley 11/04/2019, 2:50 PM

## 2019-11-05 LAB — CBC WITH DIFFERENTIAL/PLATELET
Abs Immature Granulocytes: 0.22 10*3/uL — ABNORMAL HIGH (ref 0.00–0.07)
Basophils Absolute: 0 10*3/uL (ref 0.0–0.1)
Basophils Relative: 0 %
Eosinophils Absolute: 0 10*3/uL (ref 0.0–0.5)
Eosinophils Relative: 0 %
HCT: 47.5 % (ref 39.0–52.0)
Hemoglobin: 15.7 g/dL (ref 13.0–17.0)
Immature Granulocytes: 1 %
Lymphocytes Relative: 2 %
Lymphs Abs: 0.4 10*3/uL — ABNORMAL LOW (ref 0.7–4.0)
MCH: 30.6 pg (ref 26.0–34.0)
MCHC: 33.1 g/dL (ref 30.0–36.0)
MCV: 92.6 fL (ref 80.0–100.0)
Monocytes Absolute: 0.6 10*3/uL (ref 0.1–1.0)
Monocytes Relative: 3 %
Neutro Abs: 21.4 10*3/uL — ABNORMAL HIGH (ref 1.7–7.7)
Neutrophils Relative %: 94 %
Platelets: 175 10*3/uL (ref 150–400)
RBC: 5.13 MIL/uL (ref 4.22–5.81)
RDW: 14.4 % (ref 11.5–15.5)
WBC: 22.6 10*3/uL — ABNORMAL HIGH (ref 4.0–10.5)
nRBC: 0 % (ref 0.0–0.2)

## 2019-11-05 LAB — GLUCOSE, CAPILLARY
Glucose-Capillary: 189 mg/dL — ABNORMAL HIGH (ref 70–99)
Glucose-Capillary: 286 mg/dL — ABNORMAL HIGH (ref 70–99)
Glucose-Capillary: 242 mg/dL — ABNORMAL HIGH (ref 70–99)
Glucose-Capillary: 262 mg/dL — ABNORMAL HIGH (ref 70–99)

## 2019-11-05 LAB — COMPREHENSIVE METABOLIC PANEL
ALT: 51 U/L — ABNORMAL HIGH (ref 0–44)
AST: 42 U/L — ABNORMAL HIGH (ref 15–41)
Albumin: 2.1 g/dL — ABNORMAL LOW (ref 3.5–5.0)
Alkaline Phosphatase: 71 U/L (ref 38–126)
Anion gap: 10 (ref 5–15)
BUN: 38 mg/dL — ABNORMAL HIGH (ref 8–23)
CO2: 20 mmol/L — ABNORMAL LOW (ref 22–32)
Calcium: 8.2 mg/dL — ABNORMAL LOW (ref 8.9–10.3)
Chloride: 115 mmol/L — ABNORMAL HIGH (ref 98–111)
Creatinine, Ser: 1.88 mg/dL — ABNORMAL HIGH (ref 0.61–1.24)
GFR calc Af Amer: 40 mL/min — ABNORMAL LOW (ref >60)
GFR calc non Af Amer: 35 mL/min — ABNORMAL LOW (ref >60)
Glucose, Bld: 253 mg/dL — ABNORMAL HIGH (ref 70–99)
Potassium: 3.8 mmol/L (ref 3.5–5.1)
Sodium: 145 mmol/L (ref 135–145)
Total Bilirubin: 0.7 mg/dL (ref 0.3–1.2)
Total Protein: 5.9 g/dL — ABNORMAL LOW (ref 6.5–8.1)

## 2019-11-05 LAB — PROCALCITONIN: Procalcitonin: 0.72 ng/mL

## 2019-11-05 LAB — D-DIMER, QUANTITATIVE: D-Dimer, Quant: 1.4 ug/mL-FEU — ABNORMAL HIGH (ref 0.00–0.50)

## 2019-11-05 LAB — C-REACTIVE PROTEIN: CRP: 10.7 mg/dL — ABNORMAL HIGH (ref <1.0)

## 2019-11-05 LAB — FERRITIN: Ferritin: 1171 ng/mL — ABNORMAL HIGH (ref 24–336)

## 2019-11-05 LAB — PHOSPHORUS: Phosphorus: 3.2 mg/dL (ref 2.5–4.6)

## 2019-11-05 LAB — MAGNESIUM: Magnesium: 2.5 mg/dL — ABNORMAL HIGH (ref 1.7–2.4)

## 2019-11-05 MED ORDER — DEXTROSE 5 % IV SOLN: INTRAVENOUS | Status: DC

## 2019-11-05 MED ORDER — PANTOPRAZOLE SODIUM 40 MG PO TBEC
40.0000 mg | DELAYED_RELEASE_TABLET | Freq: Every day | ORAL | Status: DC
  Administered 2019-11-05 – 2019-11-08 (×4): 40 mg via ORAL
  Filled 2019-11-05 (×4): qty 1

## 2019-11-05 NOTE — Progress Notes (Addendum)
PROGRESS NOTE    Legacy Robert Buckley  WUJ:[id number] DOB: 03/11/1945 DOA: 10/30/2019 PCP: Robert Jakes, PA-C   Chief Complaint  Patient presents with  . Code Stroke    Brief Narrative:  Robert Buckley is an 74 y.o. male with past medical history of hypertension, CVA with residual right-sided weakness, CKD stage IIIa, obese, hyperlipidemia, recent COVID-19 infection presents to emergency department with right arm weakness.   Patient presented to ER with confusion and worsening right-sided weakness at 3 PM.  EMS noted patient's oxygen saturation 90% was placed on 2 L of oxygen via nasal cannula.  Code stroke was activated and patient was evaluated by neurology.  Initial CT concerning for thalamus hemorrhage however MRI brain came back negative shows findings most consistent with small focus of mineralization/calcification.  No intracranial hemorrhage.  MRA head and neck came back negative for acute findings.  Initially his confusion was thought to be related to ICH.   He denies alcohol, smoking, illicit drug use.  Of note: Patient admitted on 9/13 with acute hypoxemic respiratory failure secondary to COVID-19 pneumonia and discharged home in stable condition on 9/15.  He received 3 out of 5 doses of remdesivir while he was hospitalized and received 2 remaining doses outpatient.  He discharged home on 10 days of steroids.  Upon arrival to ED: Patient had fever of 101.2, tachypneic, hypoxic requiring 2 L of oxygen via nasal cannula, lactic acid: 2.1, WBC: 17,000, PCT: 0.44, D-dimer: 1.08, UA positive for rare bacteria.  CMP shows worsening kidney function.  Chest x-ray shows mild right lung infiltrate increased when compared to prior study.  Patient received Rocephin and azithromycin in ED.  [facility] consulted for acute hypoxemic respiratory failure due to COVID-19 pneumonia.  Assessment & Plan:   Principal Problem:   Acute metabolic encephalopathy Active Problems:   Essential  hypertension   Dyslipidemia   Acute on chronic kidney failure (HCC)   Right hemiparesis (HCC)   Pneumonia due to COVID-19 virus   Acute hypoxemic respiratory failure (HCC)  Acute metabolic encephalopathy  Recrudescence of Chronic L Thalamic Infarct: -Likely in the setting of COVID-19 pneumonia - MRI brain without acute intracranial abnormality.  No acute intracranial hemorrhage.  Moderately advanced age related cerebral atrophy with chronic microvascular ischemic disease with Robert Buckley few scattered remote lacunar infarcts about the bilateral basal ganglia, thalami, and hemispheric cerebral white matter.  Negative intracranial MRA.  Negative MRA of neck. - he's Robert Buckley&Ox3 today, seems to have improved - continue delirium precautions - UA not c/w UTI, follow blood cultures - B12, folate, ammonia, RPR not collected as he's been refusing labs.  Negative urine tox, ethanol levels - seen initially by neurology for code stroke, but MRI with no acute abnormality and he's since improved - suspected 2/2 covid encephalopathy and recrudescence of his chronic L thalamic infarct - pt/ot/slp eval, appreciate recs  COVID 19 Pneumonia  Possible Bacterial Pneumonia  Fever:  Recently treated for covid with steroids and remdesivir (2 days completed outpatient) He's been restarted on steroids and baricitinib Currently requiring 5 L Brenda CT PE protocol without central or segmental PE, R lung interstitial opacities  Discussed with radiology regarding imaging which doesn't quite seem to fit with typical findings for covid, rads noted this could be inflammatory changes from prior infection Will continue steroids and antibiotics.  Hold baricitinib at this time. Fever noted 9/22 AM Procalcitonin is 0.44 -> 0.72 CRP has risen since recent discharge, improving today. Follow blood cx, sputum cx, MRSA pcr (negative)  Prone as able, OOB, IS, flutter Continue airborne/contact precautions  COVID-19 Labs  Recent Labs     10/29/2019 2221 10/28/2019 2221 11/03/19 2048 11/04/19 1335 11/05/19 0301  DDIMER 1.08*   < > 0.90* 1.50* 1.40*  FERRITIN 433*  --   --  791* 1,171*  LDH 348*  --  307*  --   --   CRP 11.2*  --   --  15.9* 10.7*   < > = values in this interval not displayed.    Lab Results  Component Value Date   SARSCOV2NAA POSITIVE ([name]) 10/24/2019   AKI on CKD stage IIIb  NAGMA: -Creatinine: 2.07, GFR: 36 (baseline creatinine: 1.62, GFR: 48) -improving gradually, continue to monitor with IVF  Elevated BNP  Elevated Troponin:  BNP significantly elevated, ? Related to CKD/AKI Elevated troponin, mild, suspect 2/2 demand in setting of above.  Delta < 20. Follow echocardiogram - normal EF, grade 1 diastolic dysfunction (see report)  Leukocytosis: Likely secondary to recent steroid use vs possible bacterial infection Continue antibiotics  Hypokalemia: follow, improved  History of stroke with right-sided residual weakness: -Can resume home p.o. meds-statin, plavix  Hypertension: Stable -Continue home meds amlodipine and metoprolol  Sinus Tachycardia:  - improving, follow  BPH: Continue Proscar  Thrombocytopenia: -improved.  Repeat CBC tomorrow AM (not collected today, continue to monitor)  DVT prophylaxis: heparin Code Status: full  Family Communication: none at bedside - sons 9/23 and 9/24 Disposition:   Status is: Inpatient  Remains inpatient appropriate because:Inpatient level of care appropriate due to severity of illness   Dispo: The patient is from: Home              Anticipated d/c is to: pending              Anticipated d/c date is: > 3 days              Patient currently is not medically stable to d/c.   Consultants:   neurology  Procedures:  Echo IMPRESSIONS    1. Left ventricular ejection fraction, by estimation, is 60 to 65%. The  left ventricle has normal function. The left ventricle has no regional  wall motion abnormalities. Left ventricular  diastolic parameters are  consistent with Grade I diastolic  dysfunction (impaired relaxation).  2. Right ventricular systolic function was not well visualized. The right  ventricular size is not well visualized.  3. The mitral valve was not well visualized. No evidence of mitral valve  regurgitation.  4. The aortic valve was not well visualized. Aortic valve regurgitation  is not visualized.   Antimicrobials: ( Anti-infectives (From admission, onward)   Start     Dose/Rate Route Frequency Ordered Stop   11/04/19 1600  cefTRIAXone (ROCEPHIN) 2 g in sodium chloride 0.9 % 100 mL IVPB        2 g 200 mL/hr over 30 Minutes Intravenous Every 24 hours 11/04/19 1445 11/09/19 1559   11/04/19 1600  azithromycin (ZITHROMAX) tablet 500 mg        500 mg Oral Daily 11/04/19 1445 11/09/19 0959   10/23/2019 1830  cefTRIAXone (ROCEPHIN) 1 g in sodium chloride 0.9 % 100 mL IVPB        1 g 200 mL/hr over 30 Minutes Intravenous  Once 11/09/2019 1821 11/09/2019 1937   11/05/2019 1830  azithromycin (ZITHROMAX) 500 mg in sodium chloride 0.9 % 250 mL IVPB        500 mg 250 mL/hr over 60 Minutes Intravenous  Once  25-Nov-2019 1821 11/25/19 2221         Subjective: No new complaints Uncomfortable in the proned position  Objective: Vitals:   11/04/19 1534 11/04/19 2038 11/04/19 2311 11/05/19 0811  BP:  (!) 157/94 (!) 153/98 (!) 135/93  Pulse: (!) 102 100 95 81  Resp: (!) 23  Temp: 98 F (36.7 C) 98.3 F (36.8 C) 98 F (36.7 C) (!) 97.5 F (36.4 C)  TempSrc: Axillary Oral Oral Axillary  SpO2: 92% 92% 91% (!) 89%  Weight:      Height:        Intake/Output Summary (Last 24 hours) at 11/05/2019 1124 Last data filed at 11/04/2019 2038 Gross per 24 hour  Intake --  Output 250 ml  Net -250 ml   Filed Weights   11/04/19 0305  Weight: 79 kg    Examination:  General: No acute distress. Cardiovascular: Heart sounds show Dorion Petillo regular rate, and rhythm Lungs: Clear to auscultation bilaterally   Abdomen: Soft, nontender, nondistended  Neurological: Alert and oriented 3. Proned position at time of my evaluation. Skin: Warm and dry. No rashes or lesions. Extremities: No clubbing or cyanosis. No edema.     Data Reviewed: I have personally reviewed following labs and imaging studies  CBC: Recent Labs  Lab 25-Nov-2019 1711 2019-11-25 1724 11/03/19 0833 11/04/19 1335 11/04/19 1550  WBC 17.0*  --  14.4* 19.7* 22.6*  NEUTROABS 14.9*  --   --  18.9*  --   HGB 16.0 15.6 14.4 15.1 15.8  HCT 47.7 46.0 43.5 44.7 46.7  MCV 91.7  --  95.4 90.7 90.5  PLT 179  --  122* 171 177    Basic Metabolic Panel: Recent Labs  Lab 11-25-2019 1711 2019/11/25 1711 11/25/19 1724 11/03/19 0833 11/03/19 2048 11/04/19 1335 11/04/19 1555 11/05/19 0301  NA 146*   < > 145 143 143  --  146* 145  K 3.6   < > 3.7 3.4* 4.7  --  4.2 3.8  CL 109   < > 110 112* 109  --  112* 115*  CO2 22  --   --  20* 19*  --  20* 20*  GLUCOSE 153*   < > 156* 138* 184*  --  311* 253*  BUN 27*   < > 34* 23 27*  --  34* 38*  CREATININE 2.07*   < > 1.80* 1.69* 1.97*  --  1.94* 1.88*  CALCIUM 8.0*  --   --  6.7* 8.0*  --  8.2* 8.2*  MG  --   --   --   --  2.2 2.4  --  2.5*  PHOS  --   --   --   --   --  3.4  --  3.2   < > = values in this interval not displayed.    GFR: Estimated Creatinine Clearance: 32.7 mL/min (Tan Clopper) (by C-G formula based on SCr of 1.88 mg/dL (H)).  Liver Function Tests: Recent Labs  Lab 11-25-2019 1711 11/03/19 0833 11/03/19 2048 11/04/19 1555 11/05/19 0301  AST 32 42*  ALT 36 31 36 39 51*  ALKPHOS 74 56 73 66 71  BILITOT 2.1* 1.6* 1.7* 1.4* 0.7  PROT 6.1* 4.9* 6.2* 6.2* 5.9*  ALBUMIN 2.8* 2.1* 2.5* 2.3* 2.1*    CBG: Recent Labs  Lab 11/25/19 1711 11/04/19 2110 11/05/19 0636  GLUCAP 171* 374* 189*     Recent Results (from the past 240 hour(s))  Culture,  blood (routine x 2)     Status: None (Preliminary result)   Collection Time: 11/07/2019  6:52 PM   Specimen: BLOOD LEFT ARM   Result Value Ref Range Status   Specimen Description BLOOD LEFT ARM  Final   Special Requests   Final    BOTTLES DRAWN AEROBIC AND ANAEROBIC Blood Culture adequate volume   Culture   Final    NO GROWTH 2 DAYS Performed at [facility], [address] [place], [place] [address]    Report Status PENDING  Incomplete  Culture, blood (routine x 2)     Status: None (Preliminary result)   Collection Time: 11/08/2019  6:53 PM   Specimen: BLOOD  Result Value Ref Range Status   Specimen Description BLOOD LEFT ANTECUBITAL  Final   Special Requests   Final    BOTTLES DRAWN AEROBIC AND ANAEROBIC Blood Culture adequate volume   Culture   Final    NO GROWTH 2 DAYS Performed at [facility], [address] [place], [place] [address]    Report Status PENDING  Incomplete  MRSA PCR Screening     Status: None   Collection Time: 11/04/19  7:05 PM   Specimen: Nasopharyngeal  Result Value Ref Range Status   MRSA by PCR NEGATIVE NEGATIVE Final    Comment:        The GeneXpert MRSA Assay (FDA approved for NASAL specimens only), is one component of Sheniece Ruggles comprehensive MRSA colonization surveillance program. It is not intended to diagnose MRSA infection nor to guide or monitor treatment for MRSA infections. Performed at [facility], [address] [place], [place] [address]          Radiology Studies: CT ANGIO CHEST PE W OR WO CONTRAST  Result Date: 11/04/2019 CLINICAL DATA:  Elevated D-dimer, COVID-19 positive, short of breath. EXAM: CT ANGIOGRAPHY CHEST WITH CONTRAST TECHNIQUE: Multidetector CT imaging of the chest was performed using the standard protocol during bolus administration of intravenous contrast. Multiplanar CT image reconstructions and MIPs were obtained to evaluate the vascular anatomy. CONTRAST:  100mL OMNIPAQUE IOHEXOL 350 MG/ML SOLN COMPARISON:  Chest x-ray 10/24/2019. FINDINGS: Cardiovascular: Satisfactory opacification of the pulmonary arteries to the  segmental level. No evidence of pulmonary embolism. Normal heart size. No pericardial effusion. The thoracic aorta is normal in caliber. There is mild calcified atherosclerotic plaque. Mediastinum/Nodes: Several prominent bilateral and enlarged 1.2 cm left hilar lymph nodes noted. No enlarged mediastinal or axillary lymph nodes. Thyroid gland, trachea, and esophagus demonstrate no significant findings. Lungs/Pleura: Severe septal and paraseptal emphysematous changes with bullous disease at the left base. Increased interstitial opacities within the right lung may be due to compressive atelectasis in Robert Buckley patient in the right lateral decubitus position. Findings similar but to Robert Buckley much lesser extent on the left. Left upper lobe linear atelectasis. No pleural effusion. No definite pneumothorax. Upper Abdomen: No acute abnormality. Musculoskeletal: No chest wall abnormality Densely sclerotic lesion within the T3 vertebral body likely represents Robert Buckley bone. No suspicious lytic or blastic osseous lesions. No acute displaced fracture. Multilevel degenerative changes of the spine with large superior endplate Schmorl node involving the majority of the vertebral body the T12 level. Review of the MIP images confirms the above findings. IMPRESSION: 1. No central or segmental pulmonary embolus. Unable to evaluate at the subsegmental level. 2. Right lung interstitial opacities may be partially due to the patient's right lateral decubitus positioning; however, overlying infection cannot be excluded. Left hilar lymph node likely  reactive. 3. 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 Aortic Atherosclerosis (ICD10-I70.0) and Emphysema (ICD10-J43.9). Electronically Signed   By: Tish Frederickson M.D.   On: 11/04/2019 23:00   ECHOCARDIOGRAM COMPLETE  Result Date: 11/04/2019    ECHOCARDIOGRAM REPORT   Patient Name:   Robert Buckley Date of Exam: 11/04/2019 Medical Rec #:  [id number]      Height:       67.0 in Accession #:     [id number]     Weight:       174.2 lb Date of Birth:  1945/05/26     BSA:          1.907 m Patient Age:    73 years       BP:           160/102 mmHg Patient Gender: M              HR:           97 bpm. Exam Location:  Inpatient Procedure: 2D Echo Indications:    Elevated brain natriuretic peptide (BNP) level  History:        Patient has prior history of Echocardiogram examinations, most                 recent 09/30/2016. Stroke; Risk Factors:Hypertension.  Sonographer:    Robert Buckley RDCS (AE) Referring Phys: [phone] Zuley Lutter CALDWELL POWELL JR  Sonographer Comments: No parasternal window. Image acquisition challenging due to respiratory motion. restricted mobility IMPRESSIONS  1. Left ventricular ejection fraction, by estimation, is 60 to 65%. The left ventricle has normal function. The left ventricle has no regional wall motion abnormalities. Left ventricular diastolic parameters are consistent with Grade I diastolic dysfunction (impaired relaxation).  2. Right ventricular systolic function was not well visualized. The right ventricular size is not well visualized.  3. The mitral valve was not well visualized. No evidence of mitral valve regurgitation.  4. The aortic valve was not well visualized. Aortic valve regurgitation is not visualized. FINDINGS  Left Ventricle: Left ventricular ejection fraction, by estimation, is 60 to 65%. The left ventricle has normal function. The left ventricle has no regional wall motion abnormalities. The left ventricular internal cavity size was normal in size. There is  no left ventricular hypertrophy. Left ventricular diastolic parameters are consistent with Grade I diastolic dysfunction (impaired relaxation). Right Ventricle: The right ventricular size is not well visualized. Right vetricular wall thickness was not assessed. Right ventricular systolic function was not well visualized. Left Atrium: Left atrial size was normal in size. Right Atrium: Right atrial size was not assessed.  Pericardium: There is no evidence of pericardial effusion. Mitral Valve: The mitral valve was not well visualized. No evidence of mitral valve regurgitation. Tricuspid Valve: The tricuspid valve is not well visualized. Tricuspid valve regurgitation is not demonstrated. Aortic Valve: The aortic valve was not well visualized. Aortic valve regurgitation is not visualized. Pulmonic Valve: The pulmonic valve was not well visualized. Pulmonic valve regurgitation is not visualized. Aorta: The aortic root was not well visualized. IAS/Shunts: The interatrial septum was not assessed.   Diastology LV e' medial:    6.53 cm/s LV E/e' medial:  9.7 LV e' lateral:   6.64 cm/s LV E/e' lateral: 9.5  LEFT ATRIUM           Index LA Vol (A2C): 23.0 ml 12.06 ml/m LA Vol (A4C): 18.4 ml 9.65 ml/m  AORTIC VALVE LVOT Vmax:   90.10 cm/s LVOT Vmean:  72.700 cm/s LVOT  VTI:    0.180 m MITRAL VALVE MV Area (PHT): 2.87 cm    SHUNTS MV Decel Time: 264 msec    Systemic VTI: 0.18 m MV E velocity: 63.40 cm/s MV Mitesh Rosendahl velocity: 71.60 cm/s MV E/Clancy Mullarkey ratio:  0.89 Kristeen Miss MD Electronically signed by Kristeen Miss MD Signature Date/Time: 11/04/2019/3:20:05 PM    Final         Scheduled Meds: . amLODipine  10 mg Oral Daily  . vitamin C  500 mg Oral Daily  . atorvastatin  20 mg Oral q1800  . azithromycin  500 mg Oral Daily  . clopidogrel  75 mg Oral Daily  . finasteride  5 mg Oral Daily  . heparin  5,000 Units Subcutaneous Q8H  . insulin aspart  0-5 Units Subcutaneous QHS  . insulin aspart  0-9 Units Subcutaneous TID WC  . methylPREDNISolone (SOLU-MEDROL) injection  80 mg Intravenous Q12H   Followed by  . [START ON 11/06/2019] predniSONE  50 mg Oral Daily  . metoprolol succinate  50 mg Oral Daily  . pantoprazole (PROTONIX) IV  40 mg Intravenous QHS  . zinc sulfate  220 mg Oral Daily   Continuous Infusions: . sodium chloride 1,000 mL (11/04/19 1912)  . cefTRIAXone (ROCEPHIN)  IV 2 g (11/04/19 1658)     LOS: 3 days    Time  spent: over 30 min    Lacretia Nicks, MD [facility]   To contact the attending provider between 7A-7P or the covering provider during after hours 7P-7A, please log into the web site [email or web address] and access using universal [place] password for that web site. If you do not have the password, please call the hospital operator.  11/05/2019, 11:24 AM

## 2019-11-05 NOTE — Evaluation (Signed)
Occupational Therapy Evaluation Patient Details Name: Robert Buckley MRN: [id number] DOB: 1945/06/26 Today's Date: 11/05/2019    History of Present Illness 73yoM admitted with acute hypoxemic respiratory failure due to COVID-19 pneumonia pt with Acute metabolic encephalopathy PMH: CVA c Rt weakness and RW use. Noted (+) Covid test,   Clinical Impression   PT admitted with encephalopathy COVID (+). Pt currently with functional limitiations due to the deficits listed below (see OT problem list). Pt currently with 6L O2 [place] with saturations 89-91% with DOE. Pt is from home with family (A) PRN per patient. Pt will benefit from skilled OT to increase their independence and safety with adls and balance to allow discharge CIR to decrease burden of care and return home at MOD I level for toileting feeding and basic transfers.     Follow Up Recommendations  CIR    Equipment Recommendations  None recommended by OT    Recommendations for Other Services Rehab consult     Precautions / Restrictions Precautions Precautions: Fall      Mobility Bed Mobility Overal bed mobility: Needs Assistance Bed Mobility: Supine to Sit;Sit to Supine     Supine to sit: Mod assist;HOB elevated Sit to supine: +2 for physical assistance;Mod assist   General bed mobility comments: pt requires (A) to elevate trunk from surface and help R LE off EOB  Transfers Overall transfer level: Needs assistance   Transfers: Sit to/from Stand Sit to Stand: +2 physical assistance;Mod assist         General transfer comment: pt pulling heavily with L UE to complet transfer. pt with weight shifted onto L LE    Balance Overall balance assessment: Needs assistance Sitting-balance support: Single extremity supported;Feet supported Sitting balance-Leahy Scale: Poor     Standing balance support: Single extremity supported;During functional activity Standing balance-Leahy Scale: Poor                              ADL either performed or assessed with clinical judgement   ADL Overall ADL's : Needs assistance/impaired Eating/Feeding: Set up;Sitting Eating/Feeding Details (indicate cue type and reason): L hand required     Upper Body Bathing: Maximal assistance   Lower Body Bathing: Total assistance   Upper Body Dressing : Maximal assistance   Lower Body Dressing: Total assistance   Toilet Transfer: +2 for physical assistance;Moderate assistance             General ADL Comments: pt requries to stand with decrease awareness to why therapist blocking R LE.      Vision Baseline Vision/History: Wears glasses       Perception     Praxis      Pertinent Vitals/Pain Pain Assessment: No/denies pain     Hand Dominance Right   Extremity/Trunk Assessment Upper Extremity Assessment RUE Deficits / Details: holding in elbow flexion, unable to fully extend. pt at rest with elbow in flexion, questions need to give elbow splint for contracture management. pt with wrist flexion but able to fully range to extension,    Lower Extremity Assessment Lower Extremity Assessment: Defer to PT evaluation   Cervical / Trunk Assessment Cervical / Trunk Assessment: Normal   Communication Communication Communication: HOH   Cognition Arousal/Alertness: Awake/alert Behavior During Therapy: WFL for tasks assessed/performed Overall Cognitive Status: History of cognitive impairments - at baseline  General Comments  o2 on 4.5L 84%. pt requires increased 6L % 89-91% . pt coughing with sitting and increased saturations afterward    Exercises Other Exercises Other Exercises: pt was DOE with bed mobilty and requires 3-4 minutes to rebound. pt reports SOB   Shoulder Instructions      Home Living Family/patient expects to be discharged to:: Private residence Living Arrangements: Children Available Help at Discharge: Family;Available 24  hours/day Type of Home: House Home Access: Level entry     Home Layout: One level     Bathroom Shower/Tub: Chief Strategy Officer: Handicapped height Bathroom Accessibility: Yes   Home Equipment: Shower seat;Grab bars - tub/shower;Walker - 4 wheels;Hand held shower head      Lives With: Son (x2 (one works day and night one works day only))    Prior Functioning/Environment Level of Independence: Needs Financial planner / Transfers Assistance Needed: [phone] for household distance AMB ADL's / Homemaking Assistance Needed: assisted by his sons to get in and out of tub as well as LB dressing. States he is able to toilet, groom and feed himself             OT Problem List: Decreased strength;Decreased range of motion;Decreased activity tolerance;Impaired balance (sitting and/or standing);Decreased coordination;Decreased knowledge of use of DME or AE;Cardiopulmonary status limiting activity      OT Treatment/Interventions: Self-care/ADL training;Therapeutic exercise;Energy conservation;DME and/or AE instruction;Therapeutic activities;Balance training;Patient/family education;Neuromuscular education;Cognitive remediation/compensation    OT Goals(Current goals can be found in the care plan section) Acute Rehab OT Goals Patient Stated Goal: none stated OT Goal Formulation: Patient unable to participate in goal setting Time For Goal Achievement: 11/19/19 Potential to Achieve Goals: Good  OT Frequency: Min 2X/week   Barriers to D/C:            Co-evaluation PT/OT/SLP Co-Evaluation/Treatment: Yes Reason for Co-Treatment: For patient/therapist safety;To address functional/ADL transfers   OT goals addressed during session: ADL's and self-care;Proper use of Adaptive equipment and DME      AM-PAC OT "6 Clicks" Daily Activity     Outcome Measure Help from another person eating meals?: A Little Help from another person taking care of personal grooming?: A Little Help from  another person toileting, which includes using toliet, bedpan, or urinal?: A Lot Help from another person bathing (including washing, rinsing, drying)?: A Lot Help from another person to put on and taking off regular upper body clothing?: A Lot Help from another person to put on and taking off regular lower body clothing?: A Lot 6 Click Score: 14   End of Session Equipment Utilized During Treatment: Oxygen Nurse Communication: Mobility status;Precautions  Activity Tolerance: Patient tolerated treatment well Patient left: in bed;with call bell/phone within reach;with bed alarm set  OT Visit Diagnosis: Unsteadiness on feet (R26.81);Muscle weakness (generalized) (M62.81);Hemiplegia and hemiparesis Hemiplegia - Right/Left: Right Hemiplegia - dominant/non-dominant: Dominant Hemiplegia - caused by: Cerebral infarction                Time: 0254-2706 OT Time Calculation (min): 30 min Charges:  OT General Charges $OT Visit: 1 Visit OT Evaluation $OT Eval Moderate Complexity: 1 Mod   Brynn, OTR/L  Acute Rehabilitation Services Pager: [phone] Office: [phone] .   Mateo Flow 11/05/2019, 4:29 PM

## 2019-11-05 NOTE — Progress Notes (Signed)
11/05/19 1915  PT Visit Information  Last PT Received On 11/05/19  Assistance Needed +2  PT/OT/SLP Co-Evaluation/Treatment Yes  Reason for Co-Treatment To address functional/ADL transfers;For patient/therapist safety  PT goals addressed during session Mobility/safety with mobility;Balance  History of Present Illness 73yoM admitted with acute hypoxemic respiratory failure due to COVID-19 pneumonia pt with Acute metabolic encephalopathy PMH: CVA c Rt weakness and RW use. Noted (+) Covid test,  Precautions  Precautions Fall  Restrictions  Weight Bearing Restrictions No  Home Living  Family/patient expects to be discharged to: Private residence  Living Arrangements Children  Available Help at Discharge Family;Available 24 hours/day  Type of Home House  Home Access Level entry  Home Layout One level  Bathroom Shower/Tub Tub/shower unit  Bathroom Toilet Handicapped height  Bathroom Accessibility Yes  Home Equipment Shower seat;Grab bars - tub/shower;Walker - 4 wheels;Hand held shower head   Lives With Son (x2 (one works day and night one works day only))  Prior Function  Level of Independence Needs assistance  Gait / Transfers Assistance Needed [phone] for household distance ambulation   ADL's / Homemaking Assistance Needed assisted by his sons to get in and out of tub as well as LB dressing. States he is able to toilet, groom and feed himself   Communication  Communication HOH  Pain Assessment  Pain Assessment No/denies pain  Cognition  Arousal/Alertness Awake/alert  Behavior During Therapy WFL for tasks assessed/performed  Overall Cognitive Status History of cognitive impairments - at baseline  Upper Extremity Assessment  Upper Extremity Assessment Defer to OT evaluation  Lower Extremity Assessment  Lower Extremity Assessment RLE deficits/detail  RLE Deficits / Details Decreased coordination when performing SLR. Pt having to assist RLE to perform knee flexion. No AROM at ankle.  PROM to neurtal DF position at ankle.   Cervical / Trunk Assessment  Cervical / Trunk Assessment Normal  Bed Mobility  Overal bed mobility Needs Assistance  Bed Mobility Supine to Sit;Sit to Supine  Supine to sit Mod assist;HOB elevated  Sit to supine +2 for physical assistance;Mod assist  General bed mobility comments pt requires (A) to elevate trunk from surface and help R LE off EOB  Transfers  Overall transfer level Needs assistance  Equipment used 1 person hand held assist  Transfers Sit to/from Stand  Sit to Stand +2 physical assistance;Mod assist  General transfer comment pt pulling heavily with L UE to complet transfer. pt with weight shifted onto L LE  Ambulation/Gait  Ambulation/Gait assistance Mod assist;+2 physical assistance  Assistive device 1 person hand held assist  General Gait Details Performed side steps at EOB. Required manual blocking of RLE during stance phase. Pt dragging RLE when taking side steps.   Balance  Overall balance assessment Needs assistance  Sitting-balance support Single extremity supported;Feet supported  Sitting balance-Leahy Scale Poor  Sitting balance - Comments Reliant on at least 1 UE support   Standing balance support During functional activity;Bilateral upper extremity supported  Standing balance-Leahy Scale Poor  General Comments  General comments (skin integrity, edema, etc.) o2 on 4.5L at 84%. pt requires increased 6L % 89-91% . pt coughing with sitting and increased saturations afterward. pt was DOE with bed mobilty and requires 3-4 minutes to rebound. pt reports SOB  PT - End of Session  Equipment Utilized During Treatment Gait belt  Activity Tolerance Patient tolerated treatment well;Patient limited by fatigue  Patient left in bed;with call bell/phone within reach;with bed alarm set  Nurse Communication Mobility status;Other (comment) (  new O2 requirement)  PT Assessment  PT Recommendation/Assessment Patient needs continued PT  services  PT Visit Diagnosis Difficulty in walking, not elsewhere classified (R26.2);Muscle weakness (generalized) (M62.81);Repeated falls (R29.6)  PT Problem List Decreased strength;Decreased range of motion;Decreased activity tolerance;Decreased balance;Decreased mobility;Cardiopulmonary status limiting activity;Decreased knowledge of use of DME;Decreased knowledge of precautions  PT Plan  PT Frequency (ACUTE ONLY) Min 3X/week  PT Treatment/Interventions (ACUTE ONLY) Gait training;Functional mobility training;Therapeutic activities;Therapeutic exercise;Patient/family education;DME instruction;Cognitive remediation;Balance training  AM-PAC PT "6 Clicks" Mobility Outcome Measure (Version 2)  Help needed turning from your back to your side while in a flat bed without using bedrails? 2  Help needed moving from lying on your back to sitting on the side of a flat bed without using bedrails? 2  Help needed moving to and from a bed to a chair (including a wheelchair)? 2  Help needed standing up from a chair using your arms (e.g., wheelchair or bedside chair)? 2  Help needed to walk in hospital room? 2  Help needed climbing 3-5 steps with a railing?  1  6 Click Score 11  Consider Recommendation of Discharge To: CIR/SNF/LTACH  PT Recommendation  Follow Up Recommendations CIR  PT equipment None recommended by PT  Individuals Consulted  Consulted and Agree with Results and Recommendations Patient  Acute Rehab PT Goals  Patient Stated Goal none stated  PT Goal Formulation With patient  Time For Goal Achievement 11/19/19  Potential to Achieve Goals Fair  PT Time Calculation  PT Start Time (ACUTE ONLY) 1405  PT Stop Time (ACUTE ONLY) 1435  PT Time Calculation (min) (ACUTE ONLY) 30 min  PT General Charges  $$ ACUTE PT VISIT 1 Visit  PT Evaluation  $PT Eval Moderate Complexity 1 Mod  Written Expression  Dominant Hand Right    Pt admitted secondary to problem above with deficits above. Pt with R  sided weakness and requiring mod A +2 to perform transfers and to side step at EOB. Pt reports being weaker than normal. Pt currently with 6L O2 [place] with saturations 89-91% with DOE. Pt is from home with family (A) PRN per patient. Pt will benefit from skilled OT to increase their independence and safety with adls and balance to allow discharge CIR to decrease burden of care and return home at MOD I level for mobility and ADL tasks. Will continue to follow acutely.   Farley Ly, PT, DPT  Acute Rehabilitation Services  Pager: [phone] Office: [phone]

## 2019-11-05 NOTE — Progress Notes (Signed)
Orthopedic Tech Progress Note Patient Details:  Robert Buckley 1945-05-09 [id number] Called in brace Patient ID: Robert Buckley, male   DOB: 02-04-1946, 74 y.o.   MRN: [id number]   [name] 11/05/2019, 3:48 PM

## 2019-11-05 NOTE — Plan of Care (Signed)

## 2019-11-05 NOTE — TOC Initial Note (Signed)
Transition of Care [facility]) - Initial/Assessment Note    Patient Details  Name: Robert Buckley MRN: [id number] Date of Birth: 14-Jan-1946  [facility] CM/SW Contact:    Kermit Balo, RN Phone Number: 11/05/2019, 3:49 PM  Clinical Narrative:                 Pt is from home with sons. CM attempted to call the patient in the room without success. CM reached out to his son, Robert Buckley. Robert Buckley states pt has bars in bathroom, rollator and shower seat. Robert Buckley states he provides the needed transportation for the pt and his medications.  Robert Buckley says the patient has supervision at home most of the time, however he feels the patient needs some SNF rehab prior to returning home.  Awaiting PT/OT evals.  ToC following.  Expected Discharge Plan: Skilled Nursing Facility Barriers to Discharge: Continued Medical Work up   Patient Goals and CMS Choice   CMS Medicare.gov Compare Post Acute Care list provided to:: Patient Choice offered to / list presented to : Patient, Adult Children  Expected Discharge Plan and Services Expected Discharge Plan: Skilled Nursing Facility In-house Referral: Clinical Social Work Discharge Planning Services: CM Consult   Living arrangements for the past 2 months: Apartment                                      Prior Living Arrangements/Services Living arrangements for the past 2 months: Apartment Lives with:: Adult Children                   Activities of Daily Living Home Assistive Devices/Equipment: Environmental consultant (specify type) ADL Screening (condition at time of admission) Patient's cognitive ability adequate to safely complete daily activities?: Yes Is the patient deaf or have difficulty hearing?: No Does the patient have difficulty seeing, even when wearing glasses/contacts?: No Does the patient have difficulty concentrating, remembering, or making decisions?: Yes Patient able to express need for assistance with ADLs?: Yes Does the patient  have difficulty dressing or bathing?: Yes Independently performs ADLs?: Yes (appropriate for developmental age) Does the patient have difficulty walking or climbing stairs?: Yes Weakness of Legs: Right Weakness of Arms/Hands: None  Permission Sought/Granted                  Emotional Assessment           Psych Involvement: No (comment)  Admission diagnosis:  ICH (intracerebral hemorrhage) (HCC) [I61.9] 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.32] Pneumonia due to COVID-19 virus [U07.1, J12.82] Patient Active Problem List   Diagnosis Date Noted   Acute metabolic encephalopathy 11/03/2019   Acute hypoxemic respiratory failure (HCC) 11/03/2019   ICH (intracerebral hemorrhage) (HCC) 11/10/2019   Pneumonia due to COVID-19 virus 10/25/2019   Urinary incontinence    Urinary frequency    Benign essential HTN    Right spastic hemiparesis (HCC)    Stage 3 chronic kidney disease    Left sided cerebral hemisphere cerebrovascular accident (CVA) (HCC) 10/01/2016   Right hemiparesis (HCC) 10/01/2016   Acute CVA (cerebrovascular accident) (HCC) 09/30/2016   Dyslipidemia 09/30/2016   Hypomagnesemia 09/30/2016   Acute on chronic kidney failure (HCC) 09/30/2016   Hypokalemia 09/30/2016   Fall at home, initial encounter 09/30/2016   Ataxia 09/30/2016   Essential hypertension    UTI (urinary tract infection) 09/28/2016   PCP:  Shelda Jakes,  PA-C Pharmacy:   [id number] - [place], [place] - [address] [place] [address] Phone: [phone] Fax: [phone]     Social Determinants of Health (SDOH) Interventions    Readmission Risk Interventions No flowsheet data found.

## 2019-11-06 ENCOUNTER — Inpatient Hospital Stay (HOSPITAL_COMMUNITY): Payer: Medicare HMO

## 2019-11-06 LAB — CBC WITH DIFFERENTIAL/PLATELET
Abs Immature Granulocytes: 0.1 10*3/uL — ABNORMAL HIGH (ref 0.00–0.07)
Basophils Absolute: 0 10*3/uL (ref 0.0–0.1)
Basophils Relative: 0 %
Eosinophils Absolute: 0 10*3/uL (ref 0.0–0.5)
Eosinophils Relative: 0 %
HCT: 41.8 % (ref 39.0–52.0)
Hemoglobin: 14.1 g/dL (ref 13.0–17.0)
Immature Granulocytes: 1 %
Lymphocytes Relative: 2 %
Lymphs Abs: 0.3 10*3/uL — ABNORMAL LOW (ref 0.7–4.0)
MCH: 30.7 pg (ref 26.0–34.0)
MCHC: 33.7 g/dL (ref 30.0–36.0)
MCV: 91.1 fL (ref 80.0–100.0)
Monocytes Absolute: 0.4 10*3/uL (ref 0.1–1.0)
Monocytes Relative: 3 %
Neutro Abs: 13.2 10*3/uL — ABNORMAL HIGH (ref 1.7–7.7)
Neutrophils Relative %: 94 %
Platelets: 165 10*3/uL (ref 150–400)
RBC: 4.59 MIL/uL (ref 4.22–5.81)
RDW: 14.2 % (ref 11.5–15.5)
WBC: 14.1 10*3/uL — ABNORMAL HIGH (ref 4.0–10.5)
nRBC: 0 % (ref 0.0–0.2)

## 2019-11-06 LAB — COMPREHENSIVE METABOLIC PANEL
ALT: 108 U/L — ABNORMAL HIGH (ref 0–44)
AST: 76 U/L — ABNORMAL HIGH (ref 15–41)
Albumin: 2.2 g/dL — ABNORMAL LOW (ref 3.5–5.0)
Alkaline Phosphatase: 76 U/L (ref 38–126)
Anion gap: 9 (ref 5–15)
BUN: 40 mg/dL — ABNORMAL HIGH (ref 8–23)
CO2: 20 mmol/L — ABNORMAL LOW (ref 22–32)
Calcium: 8.1 mg/dL — ABNORMAL LOW (ref 8.9–10.3)
Chloride: 112 mmol/L — ABNORMAL HIGH (ref 98–111)
Creatinine, Ser: 1.82 mg/dL — ABNORMAL HIGH (ref 0.61–1.24)
GFR calc Af Amer: 42 mL/min — ABNORMAL LOW (ref >60)
GFR calc non Af Amer: 36 mL/min — ABNORMAL LOW (ref >60)
Glucose, Bld: 295 mg/dL — ABNORMAL HIGH (ref 70–99)
Potassium: 4.1 mmol/L (ref 3.5–5.1)
Sodium: 141 mmol/L (ref 135–145)
Total Bilirubin: 0.5 mg/dL (ref 0.3–1.2)
Total Protein: 5.8 g/dL — ABNORMAL LOW (ref 6.5–8.1)

## 2019-11-06 LAB — GLUCOSE, CAPILLARY
Glucose-Capillary: 271 mg/dL — ABNORMAL HIGH (ref 70–99)
Glucose-Capillary: 297 mg/dL — ABNORMAL HIGH (ref 70–99)
Glucose-Capillary: 351 mg/dL — ABNORMAL HIGH (ref 70–99)
Glucose-Capillary: 310 mg/dL — ABNORMAL HIGH (ref 70–99)

## 2019-11-06 LAB — MAGNESIUM: Magnesium: 2.4 mg/dL (ref 1.7–2.4)

## 2019-11-06 LAB — PROCALCITONIN: Procalcitonin: 0.42 ng/mL

## 2019-11-06 LAB — D-DIMER, QUANTITATIVE: D-Dimer, Quant: 1.03 ug/mL-FEU — ABNORMAL HIGH (ref 0.00–0.50)

## 2019-11-06 LAB — C-REACTIVE PROTEIN: CRP: 5.4 mg/dL — ABNORMAL HIGH (ref <1.0)

## 2019-11-06 LAB — FERRITIN: Ferritin: 1172 ng/mL — ABNORMAL HIGH (ref 24–336)

## 2019-11-06 LAB — PHOSPHORUS: Phosphorus: 3.2 mg/dL (ref 2.5–4.6)

## 2019-11-06 LAB — BRAIN NATRIURETIC PEPTIDE: B Natriuretic Peptide: 277.3 pg/mL — ABNORMAL HIGH (ref 0.0–100.0)

## 2019-11-06 MED ORDER — INSULIN ASPART 100 UNIT/ML ~~LOC~~ SOLN
0.0000 [IU] | Freq: Three times a day (TID) | SUBCUTANEOUS | Status: DC
  Administered 2019-11-06: 15 [IU] via SUBCUTANEOUS
  Administered 2019-11-07: 3 [IU] via SUBCUTANEOUS
  Administered 2019-11-07: 5 [IU] via SUBCUTANEOUS
  Administered 2019-11-07 – 2019-11-08 (×2): 3 [IU] via SUBCUTANEOUS
  Administered 2019-11-08: 5 [IU] via SUBCUTANEOUS
  Administered 2019-11-08: 8 [IU] via SUBCUTANEOUS
  Administered 2019-11-09 (×2): 3 [IU] via SUBCUTANEOUS
  Administered 2019-11-09: 5 [IU] via SUBCUTANEOUS
  Administered 2019-11-10 (×3): 3 [IU] via SUBCUTANEOUS
  Administered 2019-11-11: 10 [IU] via SUBCUTANEOUS
  Administered 2019-11-11: 2 [IU] via SUBCUTANEOUS
  Administered 2019-11-12 (×2): 3 [IU] via SUBCUTANEOUS
  Administered 2019-11-13 (×2): 2 [IU] via SUBCUTANEOUS

## 2019-11-06 MED ORDER — INSULIN ASPART 100 UNIT/ML ~~LOC~~ SOLN
3.0000 [IU] | Freq: Three times a day (TID) | SUBCUTANEOUS | Status: DC
  Administered 2019-11-06 – 2019-11-08 (×6): 3 [IU] via SUBCUTANEOUS

## 2019-11-06 MED ORDER — BARICITINIB 2 MG PO TABS: 4.0000 mg | ORAL_TABLET | Freq: Every day | ORAL | Status: DC

## 2019-11-06 MED ORDER — INSULIN DETEMIR 100 UNIT/ML ~~LOC~~ SOLN
5.0000 [IU] | Freq: Two times a day (BID) | SUBCUTANEOUS | Status: DC
  Administered 2019-11-06 – 2019-11-08 (×5): 5 [IU] via SUBCUTANEOUS
  Filled 2019-11-06 (×6): qty 0.05

## 2019-11-06 MED ORDER — BARICITINIB 2 MG PO TABS
2.0000 mg | ORAL_TABLET | Freq: Every day | ORAL | Status: DC
  Administered 2019-11-06 – 2019-11-13 (×8): 2 mg via ORAL
  Filled 2019-11-06 (×8): qty 1

## 2019-11-06 MED ORDER — METHYLPREDNISOLONE SODIUM SUCC 40 MG IJ SOLR
40.0000 mg | Freq: Two times a day (BID) | INTRAMUSCULAR | Status: DC
  Administered 2019-11-06 – 2019-11-10 (×10): 40 mg via INTRAVENOUS
  Filled 2019-11-06 (×11): qty 1

## 2019-11-06 MED ORDER — INSULIN ASPART 100 UNIT/ML ~~LOC~~ SOLN
0.0000 [IU] | Freq: Every day | SUBCUTANEOUS | Status: DC
  Administered 2019-11-06: 4 [IU] via SUBCUTANEOUS
  Administered 2019-11-08: 3 [IU] via SUBCUTANEOUS
  Administered 2019-11-09: 2 [IU] via SUBCUTANEOUS
  Administered 2019-11-11: 5 [IU] via SUBCUTANEOUS
  Administered 2019-11-12: 2 [IU] via SUBCUTANEOUS

## 2019-11-06 MED ORDER — LACTATED RINGERS IV SOLN: INTRAVENOUS | Status: AC

## 2019-11-06 MED ORDER — POLYETHYLENE GLYCOL 3350 17 G PO PACK
17.0000 g | PACK | Freq: Two times a day (BID) | ORAL | Status: DC
  Administered 2019-11-06 – 2019-11-13 (×12): 17 g via ORAL
  Filled 2019-11-06 (×14): qty 1

## 2019-11-06 NOTE — Progress Notes (Signed)
Patient oxygen saturation is around 83 to 88%.  Patient said he felt fine,  Nothing concerning his breathing has changed.  Patient further desating to 70's when he was asleep. Switched patient to high flow  15L , oxygen saturation maintained at 92 to 97%.

## 2019-11-06 NOTE — Progress Notes (Signed)
Inpatient Rehab Admissions Coordinator Note:   Per PT/OT recommendations, pt was screened for CIR candidacy by Wolfgang Phoenix, MS, CCC-SLP.  At this time we are not recommending an Inpatient Rehab consult.  Pt was COVID positive on 10/24/19 and remains on airborne precautions.  Patients are eligible to be considered for admit to CIR when cleared from airborne precautions by acute MD or Infectious Disease.  Otherwise they will need to be >20 days from positive test with recovery/improvement in symptoms (10/3) or 2 negative tests.  Will continue to follow pt from afar.  Please contact me with questions.   Wolfgang Phoenix, MS, CCC-SLP Admissions Coordinator [phone] 11/06/19 9:42 AM

## 2019-11-06 NOTE — NC FL2 (Signed)
[place] MEDICAID FL2 LEVEL OF CARE SCREENING TOOL     IDENTIFICATION  Patient Name: Robert Buckley Birthdate: 02-11-1946 Sex: male Admission Date (Current Location): 11/09/2019  [facility] and [place] Number:  Chiropodist and Address:  [facility], [address], [place], [place] [address]      Provider Number: [id number]  Attending Physician Name and Address:  Zigmund Daniel., *  Relative Name and Phone Number:  Tasia Catchings, son, [phone]    Current Level of Care: Hospital Recommended Level of Care: Skilled Nursing Facility Prior Approval Number:    Date Approved/Denied:   PASRR Number: [id number]  Discharge Plan: SNF    Current Diagnoses: Patient Active Problem List   Diagnosis Date Noted  . Acute metabolic encephalopathy 11/03/2019  . Acute hypoxemic respiratory failure (HCC) 11/03/2019  . ICH (intracerebral hemorrhage) (HCC) 11/11/2019  . Pneumonia due to COVID-19 virus 10/25/2019  . Urinary incontinence   . Urinary frequency   . Benign essential HTN   . Right spastic hemiparesis (HCC)   . Stage 3 chronic kidney disease   . Left sided cerebral hemisphere cerebrovascular accident (CVA) (HCC) 10/01/2016  . Right hemiparesis (HCC) 10/01/2016  . Acute CVA (cerebrovascular accident) (HCC) 09/30/2016  . Dyslipidemia 09/30/2016  . Hypomagnesemia 09/30/2016  . Acute on chronic kidney failure (HCC) 09/30/2016  . Hypokalemia 09/30/2016  . Fall at home, initial encounter 09/30/2016  . Ataxia 09/30/2016  . Essential hypertension   . UTI (urinary tract infection) 09/28/2016    Orientation RESPIRATION BLADDER Height & Weight     Self, Place  O2 (Nasal cannula 4L) Continent, External catheter Weight: 174 lb 2.6 oz (79 kg) Height:  5\' 7"  (170.2 cm)  BEHAVIORAL SYMPTOMS/MOOD NEUROLOGICAL BOWEL NUTRITION STATUS      Incontinent Diet (Please see DC Summary)  AMBULATORY STATUS COMMUNICATION OF NEEDS Skin   Extensive Assist  Verbally Normal                       Personal Care Assistance Level of Assistance  Bathing, Feeding, Dressing Bathing Assistance: Limited assistance Feeding assistance: Independent Dressing Assistance: Limited assistance     Functional Limitations Info  Sight, Hearing, Speech Sight Info: Adequate Hearing Info: Adequate Speech Info: Adequate    SPECIAL CARE FACTORS FREQUENCY  PT (By licensed PT), OT (By licensed OT), Speech therapy     PT Frequency: 5x/week OT Frequency: 5x/week     Speech Therapy Frequency: 1x/week      Contractures Contractures Info: Not present    Additional Factors Info  Code Status, Allergies, Isolation Precautions, Insulin Sliding Scale Code Status Info: Full Allergies Info: Sulfa Antibiotics   Insulin Sliding Scale Info: See DC summary for dose Isolation Precautions Info: COVID+ isolation until 11/14/19     Current Medications (11/06/2019):  This is the current hospital active medication list Current Facility-Administered Medications  Medication Dose Route Frequency Provider Last Rate Last Admin  . acetaminophen (TYLENOL) tablet 650 mg  650 mg Oral Q4H PRN Bhagat, Srishti L, MD   650 mg at 11/03/19 11/05/19   Or  . acetaminophen (TYLENOL) 160 MG/5ML solution 650 mg  650 mg Per Tube Q4H PRN Bhagat, Srishti L, MD       Or  . acetaminophen (TYLENOL) suppository 650 mg  650 mg Rectal Q4H PRN Bhagat, Srishti L, MD   650 mg at 11/03/19 0007  . albuterol (VENTOLIN HFA) 108 (90 Base) MCG/ACT inhaler 2 puff  2 puff  Inhalation Q6H PRN Bhagat, Srishti L, MD      . amLODipine (NORVASC) tablet 10 mg  10 mg Oral Daily Zigmund Daniel., MD   10 mg at 11/06/19 0904  . ascorbic acid (VITAMIN C) tablet 500 mg  500 mg Oral Daily Pahwani, Rinka R, MD   500 mg at 11/06/19 0904  . atorvastatin (LIPITOR) tablet 20 mg  20 mg Oral q1800 Zigmund Daniel., MD   20 mg at 11/05/19 1724  . azithromycin (ZITHROMAX) tablet 500 mg  500 mg Oral Daily Zigmund Daniel., MD   500 mg at 11/06/19 0912  . cefTRIAXone (ROCEPHIN) 2 g in sodium chloride 0.9 % 100 mL IVPB  2 g Intravenous Q24H Zigmund Daniel., MD   Stopped at 11/05/19 1630  . chlorpheniramine-HYDROcodone (TUSSIONEX) 10-8 MG/5ML suspension 5 mL  5 mL Oral Q12H PRN Pahwani, Rinka R, MD   5 mL at 11/06/19 0904  . clopidogrel (PLAVIX) tablet 75 mg  75 mg Oral Daily Zigmund Daniel., MD   75 mg at 11/06/19 0904  . finasteride (PROSCAR) tablet 5 mg  5 mg Oral Daily Zigmund Daniel., MD   5 mg at 11/06/19 0904  . guaiFENesin-dextromethorphan (ROBITUSSIN DM) 100-10 MG/5ML syrup 10 mL  10 mL Oral Q4H PRN Pahwani, Rinka R, MD      . heparin injection 5,000 Units  5,000 Units Subcutaneous Q8H Zigmund Daniel., MD   5,000 Units at 11/06/19 0543  . insulin aspart (novoLOG) injection 0-5 Units  0-5 Units Subcutaneous QHS Zigmund Daniel., MD   3 Units at 11/05/19 2149  . insulin aspart (novoLOG) injection 0-9 Units  0-9 Units Subcutaneous TID WC Zigmund Daniel., MD   5 Units at 11/06/19 0654  . insulin detemir (LEVEMIR) injection 5 Units  5 Units Subcutaneous BID Zigmund Daniel., MD      . methylPREDNISolone sodium succinate (SOLU-MEDROL) 40 mg/mL injection 40 mg  40 mg Intravenous Q12H Zigmund Daniel., MD      . metoprolol succinate (TOPROL-XL) 24 hr tablet 50 mg  50 mg Oral Daily Zigmund Daniel., MD   50 mg at 11/06/19 0904  . pantoprazole (PROTONIX) EC tablet 40 mg  40 mg Oral QHS Zigmund Daniel., MD   40 mg at 11/05/19 2149  . zinc sulfate capsule 220 mg  220 mg Oral Daily Pahwani, Rinka R, MD   220 mg at 11/06/19 1610     Discharge Medications: Please see discharge summary for a list of discharge medications.  Relevant Imaging Results:  Relevant Lab Results:   Additional Information [id number]  Mearl Latin, LCSW

## 2019-11-06 NOTE — Evaluation (Signed)
Clinical/Bedside Swallow Evaluation Patient Details  Name: Robert Buckley MRN: [id number] Date of Birth: 03-14-1945  Today's Date: 11/06/2019 Time: SLP Start Time (ACUTE ONLY): 1440 SLP Stop Time (ACUTE ONLY): 1500 SLP Time Calculation (min) (ACUTE ONLY): 20 min  Past Medical History:  Past Medical History:  Diagnosis Date   Arthritis    Hypertension    Stroke (HCC) 2008   residual R sided weakness   Past Surgical History: History reviewed. No pertinent surgical history. HPI:  74yo male admitted Nov 24, 2019 with RUE weakness. 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 MRI = no acute abnormality. ST on CIR 2018 - memory, problem solving, attention, safety. CT on 9/25: Increasing bibasilar opacities consistent with the given clinical history.   Assessment / Plan / Recommendation Clinical Impression  Patient presents with an oropharyngeal swallow that is largely [facility] with impact from patient's impulsivitiy with oral intake but without overt s/s that would be concerning for aspiration or penetration. RR was maintained in range of 20-30, SpO2 was in range of 90-92% prior to, during, after PO intake. Patient fed self regular solids and drank thin liquids without difficulty. Mild oral residuals post initial swallow with regular solids and anterior spillage of thin liquids due to impulsivity and h/o residual right side weakness following CVA in 2010. Swallow function did not need to be addressed during 2018 CIR stay and patient had been tolerating a regular solids, thin liquids diet at that time. This bedside swallow evaluation cannot r/o silent aspiration, however patient appears to be tolerating current diet without significant difficulty and with minimal aspiration risk.  SLP Visit Diagnosis: Dysphagia, unspecified (R13.10)    Aspiration Risk  Mild aspiration risk    Diet Recommendation Regular;Thin liquid   Liquid Administration via: Cup;Straw Medication  Administration: Whole meds with liquid Supervision: Patient able to self feed Compensations: Minimize environmental distractions;Slow rate;Small sips/bites Postural Changes: Seated upright at 90 degrees    Other  Recommendations Oral Care Recommendations: Oral care BID   Follow up Recommendations 24 hour supervision/assistance (no follow up for swallow function)      Frequency and Duration   N/A         Prognosis   N/A     Swallow Study   General Date of Onset: 11/06/19 HPI: 73yo male admitted 24-Nov-2019 with RUE weakness. 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 MRI = no acute abnormality. ST on CIR 2018 - memory, problem solving, attention, safety. CT on 9/25: Increasing bibasilar opacities consistent with the given clinical history. Type of Study: Bedside Swallow Evaluation Previous Swallow Assessment: N/A Diet Prior to this Study: Regular;Thin liquids Temperature Spikes Noted: No Respiratory Status: Nasal cannula History of Recent Intubation: No Behavior/Cognition: Alert;Cooperative;Pleasant mood Oral Cavity Assessment: Within Functional Limits Oral Care Completed by SLP: No (patient actively eating lunch when SLP arrived) Oral Cavity - Dentition: Adequate natural dentition Vision: Functional for self-feeding Self-Feeding Abilities: Able to feed self Patient Positioning: Upright in bed Baseline Vocal Quality: Normal Volitional Cough: Strong Volitional Swallow: Able to elicit    Oral/Motor/Sensory Function Overall Oral Motor/Sensory Function: Within functional limits   Ice Chips     Thin Liquid Thin Liquid: Within functional limits Presentation: Self Fed;Straw;Cup    Nectar Thick     Honey Thick     Puree Puree: Within functional limits Presentation: Self Fed;Spoon   Solid     Solid: Impaired Oral Phase Impairments: Impaired mastication Oral Phase Functional Implications: Right anterior  spillage Other Comments: Patient  impulsive with eating and self feeding resulting in some anterior spillage of solids as well as mild oral residuals right side oral cavity post initial swallow that cleared with subsequent swallows      Angela Nevin, MA, CCC-SLP Speech Therapy [facility]

## 2019-11-06 NOTE — Progress Notes (Addendum)
PROGRESS NOTE    Robert Buckley  XBM:[id number] DOB: [birth year] DOA: 11/07/2019 PCP: Robert Jakes, PA-C   Chief Complaint  Patient presents with  . Code Stroke    Brief Narrative:  Robert Buckley is an 74 y.o. male with past medical history of hypertension, CVA with residual right-sided weakness, CKD stage IIIa, obese, hyperlipidemia, recent COVID-19 infection presents to emergency department with right arm weakness.   Patient presented to ER with confusion and worsening right-sided weakness at 3 PM.  EMS noted patient's oxygen saturation 90% was placed on 2 L of oxygen via nasal cannula.  Code stroke was activated and patient was evaluated by neurology.  Initial CT concerning for thalamus hemorrhage however MRI brain came back negative shows findings most consistent with small focus of mineralization/calcification.  No intracranial hemorrhage.  MRA head and neck came back negative for acute findings.  Initially his confusion was thought to be related to ICH.   He denies alcohol, smoking, illicit drug use.  Of note: Patient admitted on 9/13 with acute hypoxemic respiratory failure secondary to COVID-19 pneumonia and discharged home in stable condition on 9/15.  He received 3 out of 5 doses of remdesivir while he was hospitalized and received 2 remaining doses outpatient.  He discharged home on 10 days of steroids.  Upon arrival to ED: Patient had fever of 101.2, tachypneic, hypoxic requiring 2 L of oxygen via nasal cannula, lactic acid: 2.1, WBC: 17,000, PCT: 0.44, D-dimer: 1.08, UA positive for rare bacteria.  CMP shows worsening kidney function.  Chest x-ray shows mild right lung infiltrate increased when compared to prior study.  Patient received Rocephin and azithromycin in ED.  [facility] hospitalist consulted for acute hypoxemic respiratory failure due to COVID-19 pneumonia.  Assessment & Plan:   Principal Problem:   Acute metabolic encephalopathy Active Problems:   Essential  hypertension   Dyslipidemia   Acute on chronic kidney failure (HCC)   Right hemiparesis (HCC)   Pneumonia due to COVID-19 virus   Acute hypoxemic respiratory failure (HCC)  Acute metabolic encephalopathy  Recrudescence of Chronic L Thalamic Infarct: -Likely in the setting of COVID-19 pneumonia - MRI brain without acute intracranial abnormality.  No acute intracranial hemorrhage.  Moderately advanced age related cerebral atrophy with chronic microvascular ischemic disease with [name] few scattered remote lacunar infarcts about the bilateral basal ganglia, thalami, and hemispheric cerebral white matter.  Negative intracranial MRA.  Negative MRA of neck. - he's [name]&Ox3 today, seems to have improved - continue delirium precautions - UA not c/w UTI, follow blood cultures - B12, folate, ammonia, RPR not collected as he's been refusing labs.  Negative urine tox, ethanol levels - seen initially by neurology for code stroke, but MRI with no acute abnormality and he's since improved - suspected 2/2 covid encephalopathy and recrudescence of his chronic L thalamic infarct - pt/ot/slp eval, appreciate recs  COVID 19 Pneumonia  Possible Bacterial Pneumonia  Fever:  Recently treated for covid with steroids and remdesivir (2 days completed outpatient) O2 requirement increased to 15 L yesterday night CXR 9/25 with increasing bibasilar opacities  CT PE protocol without central or segmental PE, R lung interstitial opacities  Will continue steroids and antibiotics.  Resume baricitinib with worsening resp status (discussed risks/benefits with family). Fever noted 9/22 AM Procalcitonin peaked at 0.78, 0.42 today CRP improving now Follow blood cx, sputum cx, MRSA pcr (negative) Prone as able, OOB, IS, flutter Continue airborne/contact precautions  COVID-19 Labs  Recent Labs    11/03/19 2048 11/03/19 2048  11/04/19 1335 11/05/19 0301 11/06/19 0353  DDIMER 0.90*   < > 1.50* 1.40* 1.03*  FERRITIN  --   --   791* 1,171* 1,172*  LDH 307*  --   --   --   --   CRP  --   --  15.9* 10.7* 5.4*   < > = values in this interval not displayed.    Lab Results  Component Value Date   SARSCOV2NAA POSITIVE (Ashling Roane) 10/24/2019   AKI on CKD stage IIIb  NAGMA: -Creatinine: 2.07, GFR: 36 (baseline creatinine: 1.62, GFR: 48) -improving gradually, continue to monitor with IVF  Elevated BNP  Elevated Troponin:  BNP significantly elevated, ? Related to CKD/AKI Elevated troponin, mild, suspect 2/2 demand in setting of above.  Delta < 20. Follow echocardiogram - normal EF, grade 1 diastolic dysfunction (see report)  Leukocytosis: Likely secondary to recent steroid use vs possible bacterial infection Continue antibiotics  Hypokalemia: follow, improved  History of stroke with right-sided residual weakness: -Can resume home p.o. meds-statin, plavix  Hypertension: Stable -Continue home meds amlodipine and metoprolol  Sinus Tachycardia:  - improved  BPH: Continue Proscar  Thrombocytopenia: -improved.  Repeat CBC tomorrow AM (not collected today, continue to monitor)  DVT prophylaxis: heparin Code Status: full  Family Communication: none at bedside - son 9/25 Disposition:   Status is: Inpatient  Remains inpatient appropriate because:Inpatient level of care appropriate due to severity of illness   Dispo: The patient is from: Home              Anticipated d/c is to: pending              Anticipated d/c date is: > 3 days              Patient currently is not medically stable to d/c.   Consultants:   neurology  Procedures:  Echo IMPRESSIONS    1. Left ventricular ejection fraction, by estimation, is 60 to 65%. The  left ventricle has normal function. The left ventricle has no regional  wall motion abnormalities. Left ventricular diastolic parameters are  consistent with Grade I diastolic  dysfunction (impaired relaxation).  2. Right ventricular systolic function was not well  visualized. The right  ventricular size is not well visualized.  3. The mitral valve was not well visualized. No evidence of mitral valve  regurgitation.  4. The aortic valve was not well visualized. Aortic valve regurgitation  is not visualized.   Antimicrobials: ( Anti-infectives (From admission, onward)   Start     Dose/Rate Route Frequency Ordered Stop   11/04/19 1600  cefTRIAXone (ROCEPHIN) 2 g in sodium chloride 0.9 % 100 mL IVPB        2 g 200 mL/hr over 30 Minutes Intravenous Every 24 hours 11/04/19 1445 11/09/19 1559   11/04/19 1600  azithromycin (ZITHROMAX) tablet 500 mg        500 mg Oral Daily 11/04/19 1445 11/09/19 0959   11-29-2019 1830  cefTRIAXone (ROCEPHIN) 1 g in sodium chloride 0.9 % 100 mL IVPB        1 g 200 mL/hr over 30 Minutes Intravenous  Once 11-29-2019 1821 11-29-2019 1937   11-29-2019 1830  azithromycin (ZITHROMAX) 500 mg in sodium chloride 0.9 % 250 mL IVPB        500 mg 250 mL/hr over 60 Minutes Intravenous  Once 11-29-2019 1821 11-29-2019 2221         Subjective: No new complaints Asking for Adrienne Delay hamburger  Objective: Vitals:  11/05/19 2128 11/05/19 2329 11/06/19 0401 11/06/19 0841  BP: 139/89 (!) 138/95 (!) 154/97 (!) 148/110  Pulse: 85 75 77 88  Resp: 19 (!) 25 20 (!) 21  Temp: 97.6 F (36.4 C) 98.1 F (36.7 C) 98.5 F (36.9 C) 98.5 F (36.9 C)  TempSrc: Oral Oral Oral Oral  SpO2: (!) 81% 92% 90% 93%  Weight:      Height:        Intake/Output Summary (Last 24 hours) at 11/06/2019 1508 Last data filed at 11/06/2019 0726 Gross per 24 hour  Intake 298.53 ml  Output 1700 ml  Net -1401.47 ml   Filed Weights   11/04/19 0305  Weight: 79 kg    Examination:  General: No acute distress. Cardiovascular: Heart sounds show Najee Manninen regular rate, and rhythm.  Lungs: Clear to auscultation bilaterally Abdomen: Soft, nontender, nondistended Neurological: Alert and oriented. Moves all extremities 4. Cranial nerves II through XII grossly intact. Skin: Warm  and dry. No rashes or lesions. Extremities: No clubbing or cyanosis. No edema.  Data Reviewed: I have personally reviewed following labs and imaging studies  CBC: Recent Labs  Lab 11/15/19 1711 Nov 15, 2019 1724 11/03/19 0833 11/04/19 1335 11/04/19 1550 11/05/19 1807 11/06/19 0353  WBC 17.0*   < > 14.4* 19.7* 22.6* 22.6* 14.1*  NEUTROABS 14.9*  --   --  18.9*  --  21.4* 13.2*  HGB 16.0   < > 14.4 15.1 15.8 15.7 14.1  HCT 47.7   < > 43.5 44.7 46.7 47.5 41.8  MCV 91.7   < > 95.4 90.7 90.5 92.6 91.1  PLT 179   < > 122* 171 177 175 165   < > = values in this interval not displayed.    Basic Metabolic Panel: Recent Labs  Lab 11/03/19 0833 11/03/19 2048 11/04/19 1335 11/04/19 1555 11/05/19 0301 11/06/19 0353  NA 143 143  --  146* 145 141  K 3.4* 4.7  --  4.2 3.8 4.1  CL 112* 109  --  112* 115* 112*  CO2 20* 19*  --  20* 20* 20*  GLUCOSE 138* 184*  --  311* 253* 295*  BUN 23 27*  --  34* 38* 40*  CREATININE 1.69* 1.97*  --  1.94* 1.88* 1.82*  CALCIUM 6.7* 8.0*  --  8.2* 8.2* 8.1*  MG  --  2.2 2.4  --  2.5* 2.4  PHOS  --   --  3.4  --  3.2 3.2    GFR: Estimated Creatinine Clearance: 33.8 mL/min (Dyron Kawano) (by C-G formula based on SCr of 1.82 mg/dL (H)).  Liver Function Tests: Recent Labs  Lab 11/03/19 0833 11/03/19 2048 11/04/19 1555 11/05/19 0301 11/06/19 0353  AST 22 23 32 42* 76*  ALT 31 36 39 51* 108*  ALKPHOS 56 73 66 71 76  BILITOT 1.6* 1.7* 1.4* 0.7 0.5  PROT 4.9* 6.2* 6.2* 5.9* 5.8*  ALBUMIN 2.1* 2.5* 2.3* 2.1* 2.2*    CBG: Recent Labs  Lab 11/05/19 1236 11/05/19 1702 11/05/19 2125 11/06/19 0627 11/06/19 1115  GLUCAP 286* 242* 262* 271* 297*     Recent Results (from the past 240 hour(s))  Culture, blood (routine x 2)     Status: None (Preliminary result)   Collection Time: 11/15/2019  6:52 PM   Specimen: BLOOD LEFT ARM  Result Value Ref Range Status   Specimen Description BLOOD LEFT ARM  Final   Special Requests   Final    BOTTLES DRAWN AEROBIC  AND ANAEROBIC Blood  Culture adequate volume   Culture   Final    NO GROWTH 4 DAYS Performed at [facility], [address] [place], [place] [address]    Report Status PENDING  Incomplete  Culture, blood (routine x 2)     Status: None (Preliminary result)   Collection Time: Dec 02, 2019  6:53 PM   Specimen: BLOOD  Result Value Ref Range Status   Specimen Description BLOOD LEFT ANTECUBITAL  Final   Special Requests   Final    BOTTLES DRAWN AEROBIC AND ANAEROBIC Blood Culture adequate volume   Culture   Final    NO GROWTH 4 DAYS Performed at [facility], [address], [place], [place] [address]    Report Status PENDING  Incomplete  MRSA PCR Screening     Status: None   Collection Time: 11/04/19  7:05 PM   Specimen: Nasopharyngeal  Result Value Ref Range Status   MRSA by PCR NEGATIVE NEGATIVE Final    Comment:        The GeneXpert MRSA Assay (FDA approved for NASAL specimens only), is one component of Tali Cleaves comprehensive MRSA colonization surveillance program. It is not intended to diagnose MRSA infection nor to guide or monitor treatment for MRSA infections. Performed at [facility], [address], [place], [place] [address]          Radiology Studies: CT ANGIO CHEST PE W OR WO CONTRAST  Result Date: 11/04/2019 CLINICAL DATA:  Elevated D-dimer, COVID-19 positive, short of breath. EXAM: CT ANGIOGRAPHY CHEST WITH CONTRAST TECHNIQUE: Multidetector CT imaging of the chest was performed using the standard protocol during bolus administration of intravenous contrast. Multiplanar CT image reconstructions and MIPs were obtained to evaluate the vascular anatomy. CONTRAST:  OMNIPAQUE IOHEXOL 350 MG/ML SOLN COMPARISON:  Chest x-ray 10/24/2019. FINDINGS: Cardiovascular: Satisfactory opacification of the pulmonary arteries to the segmental level. No evidence of pulmonary embolism. Normal heart size. No pericardial effusion. The thoracic aorta is normal in caliber.  There is mild calcified atherosclerotic plaque. Mediastinum/Nodes: Several prominent bilateral and enlarged 1.2 cm left hilar lymph nodes noted. No enlarged mediastinal or axillary lymph nodes. Thyroid gland, trachea, and esophagus demonstrate no significant findings. Lungs/Pleura: Severe septal and paraseptal emphysematous changes with bullous disease at the left base. Increased interstitial opacities within the right lung may be due to compressive atelectasis in Laci Frenkel patient in the right lateral decubitus position. Findings similar but to Demaris Leavell much lesser extent on the left. Left upper lobe linear atelectasis. No pleural effusion. No definite pneumothorax. Upper Abdomen: No acute abnormality. Musculoskeletal: No chest wall abnormality Densely sclerotic lesion within the T3 vertebral body likely represents Tykia Mellone bone. No suspicious lytic or blastic osseous lesions. No acute displaced fracture. Multilevel degenerative changes of the spine with large superior endplate Schmorl node involving the majority of the vertebral body the T12 level. Review of the MIP images confirms the above findings. IMPRESSION: 1. No central or segmental pulmonary embolus. Unable to evaluate at the subsegmental level. 2. Right lung interstitial opacities may be partially due to the patient's right lateral decubitus positioning; however, overlying infection cannot be excluded. Left hilar lymph node likely reactive. 3. 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 Aortic Atherosclerosis (ICD10-I70.0) and Emphysema (ICD10-J43.9). Electronically Signed   By: Tish Frederickson M.D.   On: 11/04/2019 23:00   DG CHEST PORT 1 VIEW  Result Date: 11/06/2019 CLINICAL DATA:  COVID-19 positivity with shortness of breath EXAM: PORTABLE CHEST 1 VIEW COMPARISON:  11/02/2018  FINDINGS: Cardiac shadow is accentuated by the portable technique. Bibasilar increasing airspace opacity is noted consistent with the given clinical history of COVID-19 positivity.  No sizable effusion is seen. No bony abnormality is noted. IMPRESSION: Increasing bibasilar opacities consistent with the given clinical history. Electronically Signed   By: Alcide Clever M.D.   On: 11/06/2019 14:11        Scheduled Meds: . amLODipine  10 mg Oral Daily  . vitamin C  500 mg Oral Daily  . atorvastatin  20 mg Oral q1800  . azithromycin  500 mg Oral Daily  . baricitinib  2 mg Oral Daily  . clopidogrel  75 mg Oral Daily  . finasteride  5 mg Oral Daily  . heparin  5,000 Units Subcutaneous Q8H  . insulin aspart  0-5 Units Subcutaneous QHS  . insulin aspart  0-9 Units Subcutaneous TID WC  . insulin detemir  5 Units Subcutaneous BID  . methylPREDNISolone (SOLU-MEDROL) injection  40 mg Intravenous Q12H  . metoprolol succinate  50 mg Oral Daily  . pantoprazole  40 mg Oral QHS  . zinc sulfate  220 mg Oral Daily   Continuous Infusions: . cefTRIAXone (ROCEPHIN)  IV Stopped (11/05/19 1630)     LOS: 4 days    Time spent: over 30 min    Lacretia Nicks, MD [facility]   To contact the attending provider between 7A-7P or the covering provider during after hours 7P-7A, please log into the web site [email or web address] and access using universal [place] password for that web site. If you do not have the password, please call the hospital operator.  11/06/2019, 3:08 PM

## 2019-11-07 LAB — CBC WITH DIFFERENTIAL/PLATELET
Abs Immature Granulocytes: 0.11 10*3/uL — ABNORMAL HIGH (ref 0.00–0.07)
Basophils Absolute: 0 10*3/uL (ref 0.0–0.1)
Basophils Relative: 0 %
Eosinophils Absolute: 0 10*3/uL (ref 0.0–0.5)
Eosinophils Relative: 0 %
HCT: 44.8 % (ref 39.0–52.0)
Hemoglobin: 14.8 g/dL (ref 13.0–17.0)
Immature Granulocytes: 1 %
Lymphocytes Relative: 5 %
Lymphs Abs: 0.6 10*3/uL — ABNORMAL LOW (ref 0.7–4.0)
MCH: 31.1 pg (ref 26.0–34.0)
MCHC: 33 g/dL (ref 30.0–36.0)
MCV: 94.1 fL (ref 80.0–100.0)
Monocytes Absolute: 0.4 10*3/uL (ref 0.1–1.0)
Monocytes Relative: 3 %
Neutro Abs: 11.7 10*3/uL — ABNORMAL HIGH (ref 1.7–7.7)
Neutrophils Relative %: 91 %
Platelets: 154 10*3/uL (ref 150–400)
RBC: 4.76 MIL/uL (ref 4.22–5.81)
RDW: 14.3 % (ref 11.5–15.5)
WBC: 12.8 10*3/uL — ABNORMAL HIGH (ref 4.0–10.5)
nRBC: 0 % (ref 0.0–0.2)

## 2019-11-07 LAB — CULTURE, BLOOD (ROUTINE X 2)
Culture: NO GROWTH
Culture: NO GROWTH
Special Requests: ADEQUATE
Special Requests: ADEQUATE

## 2019-11-07 LAB — COMPREHENSIVE METABOLIC PANEL
ALT: 115 U/L — ABNORMAL HIGH (ref 0–44)
AST: 47 U/L — ABNORMAL HIGH (ref 15–41)
Albumin: 2.2 g/dL — ABNORMAL LOW (ref 3.5–5.0)
Alkaline Phosphatase: 68 U/L (ref 38–126)
Anion gap: 12 (ref 5–15)
BUN: 41 mg/dL — ABNORMAL HIGH (ref 8–23)
CO2: 19 mmol/L — ABNORMAL LOW (ref 22–32)
Calcium: 8 mg/dL — ABNORMAL LOW (ref 8.9–10.3)
Chloride: 110 mmol/L (ref 98–111)
Creatinine, Ser: 1.62 mg/dL — ABNORMAL HIGH (ref 0.61–1.24)
GFR calc Af Amer: 48 mL/min — ABNORMAL LOW (ref >60)
GFR calc non Af Amer: 41 mL/min — ABNORMAL LOW (ref >60)
Glucose, Bld: 163 mg/dL — ABNORMAL HIGH (ref 70–99)
Potassium: 4.9 mmol/L (ref 3.5–5.1)
Sodium: 141 mmol/L (ref 135–145)
Total Bilirubin: 0.9 mg/dL (ref 0.3–1.2)
Total Protein: 5.9 g/dL — ABNORMAL LOW (ref 6.5–8.1)

## 2019-11-07 LAB — GLUCOSE, CAPILLARY
Glucose-Capillary: 188 mg/dL — ABNORMAL HIGH (ref 70–99)
Glucose-Capillary: 151 mg/dL — ABNORMAL HIGH (ref 70–99)
Glucose-Capillary: 219 mg/dL — ABNORMAL HIGH (ref 70–99)
Glucose-Capillary: 181 mg/dL — ABNORMAL HIGH (ref 70–99)

## 2019-11-07 LAB — D-DIMER, QUANTITATIVE: D-Dimer, Quant: 1.02 ug/mL-FEU — ABNORMAL HIGH (ref 0.00–0.50)

## 2019-11-07 LAB — PHOSPHORUS: Phosphorus: 3.8 mg/dL (ref 2.5–4.6)

## 2019-11-07 LAB — C-REACTIVE PROTEIN: CRP: 2.2 mg/dL — ABNORMAL HIGH (ref <1.0)

## 2019-11-07 LAB — MAGNESIUM: Magnesium: 2.5 mg/dL — ABNORMAL HIGH (ref 1.7–2.4)

## 2019-11-07 LAB — FERRITIN: Ferritin: 1054 ng/mL — ABNORMAL HIGH (ref 24–336)

## 2019-11-07 MED ORDER — METOPROLOL TARTRATE 12.5 MG HALF TABLET
12.5000 mg | ORAL_TABLET | Freq: Two times a day (BID) | ORAL | Status: DC
  Administered 2019-11-08 – 2019-11-13 (×12): 12.5 mg via ORAL
  Filled 2019-11-07 (×12): qty 1

## 2019-11-07 NOTE — Plan of Care (Signed)
  Problem: Nutrition: Goal: Adequate nutrition will be maintained Outcome: Progressing   Problem: Clinical Measurements: Goal: Will remain free from infection Outcome: Not Applicable   Problem: Clinical Measurements: Goal: Ability to maintain clinical measurements within normal limits will improve Outcome: Progressing

## 2019-11-07 NOTE — Progress Notes (Signed)
Dr. Lowell Guitar on unit notified of pt HR dropping to the 40's and 50's. Pt am Metoprolol held and not given due to pt HR. MD OK with holding and not giving pt this am dose. Dionne Bucy RN

## 2019-11-07 NOTE — Progress Notes (Addendum)
PROGRESS NOTE    Robert Buckley  HER:[id number] DOB: Sep 07, 1945 DOA: 10/16/2019 PCP: Shelda Jakes, PA-C   Chief Complaint  Patient presents with  . Code Stroke    Brief Narrative:  Robert Buckley is an 74 y.o. male with past medical history of hypertension, CVA with residual right-sided weakness, CKD stage IIIa, obese, hyperlipidemia, recent COVID-19 infection presents to emergency department with right arm weakness.   Patient presented to ER with confusion and worsening right-sided weakness at 3 PM.  EMS noted patient's oxygen saturation 90% was placed on 2 L of oxygen via nasal cannula.  Code stroke was activated and patient was evaluated by neurology.  Initial CT concerning for thalamus hemorrhage however MRI brain came back negative shows findings most consistent with small focus of mineralization/calcification.  No intracranial hemorrhage.  MRA head and neck came back negative for acute findings.  Initially his confusion was thought to be related to ICH.   He denies alcohol, smoking, illicit drug use.  Of note: Patient admitted on 9/13 with acute hypoxemic respiratory failure secondary to COVID-19 pneumonia and discharged home in stable condition on 9/15.  He received 3 out of 5 doses of remdesivir while he was hospitalized and received 2 remaining doses outpatient.  He discharged home on 10 days of steroids.  Upon arrival to ED: Patient had fever of 101.2, tachypneic, hypoxic requiring 2 L of oxygen via nasal cannula, lactic acid: 2.1, WBC: 17,000, PCT: 0.44, D-dimer: 1.08, UA positive for rare bacteria.  CMP shows worsening kidney function.  Chest x-ray shows mild right lung infiltrate increased when compared to prior study.  Patient received Rocephin and azithromycin in ED.  [facility] consulted for acute hypoxemic respiratory failure due to COVID-19 pneumonia.  Assessment & Plan:   Principal Problem:   Acute metabolic encephalopathy Active Problems:   Essential  hypertension   Dyslipidemia   Acute on chronic kidney failure (HCC)   Right hemiparesis (HCC)   Pneumonia due to COVID-19 virus   Acute hypoxemic respiratory failure (HCC)  Acute metabolic encephalopathy  Recrudescence of Chronic L Thalamic Infarct: -Likely in the setting of COVID-19 pneumonia - MRI brain without acute intracranial abnormality.  No acute intracranial hemorrhage.  Moderately advanced age related cerebral atrophy with chronic microvascular ischemic disease with Ellizabeth Dacruz few scattered remote lacunar infarcts about the bilateral basal ganglia, thalami, and hemispheric cerebral white matter.  Negative intracranial MRA.  Negative MRA of neck. - he's Gizel Riedlinger&Ox3 today, seems to have improved - continue delirium precautions - UA not c/w UTI, follow blood cultures - B12, folate, ammonia, RPR not collected as he's been refusing labs -> will check these tomorrow, though he seems improved.  Negative urine tox, ethanol levels - seen initially by neurology for code stroke, but MRI with no acute abnormality and he's since improved - suspected 2/2 covid encephalopathy and recrudescence of his chronic L thalamic infarct - pt/ot/slp eval, appreciate recs  COVID 19 Pneumonia  Possible Bacterial Pneumonia  Fever:  Recently treated for covid with steroids and remdesivir (2 days completed outpatient) Continues on 15 L HFNC  CXR 9/25 with increasing bibasilar opacities  CT PE protocol without central or segmental PE, R lung interstitial opacities  Will continue steroids and antibiotics.  baricitinib with worsening resp status (discussed risks/benefits with family). Fever noted 9/22 AM Procalcitonin peaked at 0.78, 0.42 today CRP improving now Follow blood cx, sputum cx, MRSA pcr (negative) Prone as able, OOB, IS, flutter Continue airborne/contact precautions  COVID-19 Labs  Recent Labs  11/05/19 0301 11/06/19 0353 11/07/19 0723  DDIMER 1.40* 1.03* 1.02*  FERRITIN 1,171* 1,172* 1,054*  CRP  10.7* 5.4* 2.2*    Lab Results  Component Value Date   SARSCOV2NAA POSITIVE (Langdon Crosson) 10/24/2019   AKI on CKD stage IIIb  NAGMA: -Creatinine: 2.07, GFR: 36 (baseline creatinine: 1.62, GFR: 48) -appears close to baseline, follow   Elevated BNP  Elevated Troponin:  BNP significantly elevated, ? Related to CKD/AKI Elevated troponin, mild, suspect 2/2 demand in setting of above.  Delta < 20. Follow echocardiogram - normal EF, grade 1 diastolic dysfunction (see report)  Leukocytosis: Likely secondary to recent steroid use vs possible bacterial infection Continue antibiotics  Hypokalemia: follow, improved  History of stroke with right-sided residual weakness: -Can resume home p.o. meds-statin, plavix  Hypertension: Stable -Continue home meds amlodipine and metoprolol  Sinus Tachycardia:  - improved  BPH: Continue Proscar  Thrombocytopenia: -improved.   DVT prophylaxis: heparin Code Status: full  Family Communication: none at bedside - son 9/26 Disposition:   Status is: Inpatient  Remains inpatient appropriate because:Inpatient level of care appropriate due to severity of illness   Dispo: The patient is from: Home              Anticipated d/c is to: pending              Anticipated d/c date is: > 3 days              Patient currently is not medically stable to d/c.   Consultants:   neurology  Procedures:  Echo IMPRESSIONS    1. Left ventricular ejection fraction, by estimation, is 60 to 65%. The  left ventricle has normal function. The left ventricle has no regional  wall motion abnormalities. Left ventricular diastolic parameters are  consistent with Grade I diastolic  dysfunction (impaired relaxation).  2. Right ventricular systolic function was not well visualized. The right  ventricular size is not well visualized.  3. The mitral valve was not well visualized. No evidence of mitral valve  regurgitation.  4. The aortic valve was not well  visualized. Aortic valve regurgitation  is not visualized.   Antimicrobials: ( Anti-infectives (From admission, onward)   Start     Dose/Rate Route Frequency Ordered Stop   11/04/19 1600  cefTRIAXone (ROCEPHIN) 2 g in sodium chloride 0.9 % 100 mL IVPB        2 g 200 mL/hr over 30 Minutes Intravenous Every 24 hours 11/04/19 1445 11/09/19 1559   11/04/19 1600  azithromycin (ZITHROMAX) tablet 500 mg        500 mg Oral Daily 11/04/19 1445 11/09/19 0959   10/31/2019 1830  cefTRIAXone (ROCEPHIN) 1 g in sodium chloride 0.9 % 100 mL IVPB        1 g 200 mL/hr over 30 Minutes Intravenous  Once 10/30/2019 1821 11/07/2019 1937   10/31/2019 1830  azithromycin (ZITHROMAX) 500 mg in sodium chloride 0.9 % 250 mL IVPB        500 mg 250 mL/hr over 60 Minutes Intravenous  Once 10/24/2019 1821 10/16/2019 2221         Subjective: No new complaints today Generally stoic  Objective: Vitals:   11/06/19 2328 11/07/19 0424 11/07/19 0850 11/07/19 1255  BP: 129/84 (!) 138/99 129/87 (!) 136/91  Pulse: (!) 52 (!) 51 (!) 58 88  Resp: 16 13 20 16   Temp: 98.1 F (36.7 C) 97.9 F (36.6 C) 97.8 F (36.6 C) 98.3 F (36.8 C)  TempSrc: Oral Oral  Oral Oral  SpO2: 91% 100% 94% 97%  Weight:      Height:        Intake/Output Summary (Last 24 hours) at 11/07/2019 1521 Last data filed at 11/07/2019 1321 Gross per 24 hour  Intake 1574.2 ml  Output 1400 ml  Net 174.2 ml   Filed Weights   11/04/19 0305  Weight: 79 kg    Examination:  General: No acute distress. Cardiovascular: Heart sounds show Holly Iannaccone regular rate, and rhythm.  Lungs: Clear to auscultation bilaterally Abdomen: Soft, nontender, nondistended  Neurological: Alert and oriented 3. R sided weakness. Skin: Warm and dry. No rashes or lesions. Extremities: No clubbing or cyanosis. No edema.   Data Reviewed: I have personally reviewed following labs and imaging studies  CBC: Recent Labs  Lab 11/07/2019 1711 11/01/2019 1724 11/04/19 1335 11/04/19 1550  11/05/19 1807 11/06/19 0353 11/07/19 0723  WBC 17.0*   < > 19.7* 22.6* 22.6* 14.1* 12.8*  NEUTROABS 14.9*  --  18.9*  --  21.4* 13.2* 11.7*  HGB 16.0   < > 15.1 15.8 15.7 14.1 14.8  HCT 47.7   < > 44.7 46.7 47.5 41.8 44.8  MCV 91.7   < > 90.7 90.5 92.6 91.1 94.1  PLT 179   < > 171 177 175 165 154   < > = values in this interval not displayed.    Basic Metabolic Panel: Recent Labs  Lab 11/03/19 2048 11/04/19 1335 11/04/19 1555 11/05/19 0301 11/06/19 0353 11/07/19 0723  NA 143  --  146* 145 141 141  K 4.7  --  4.2 3.8 4.1 4.9  CL 109  --  112* 115* 112* 110  CO2 19*  --  20* 20* 20* 19*  GLUCOSE 184*  --  311* 253* 295* 163*  BUN 27*  --  34* 38* 40* 41*  CREATININE 1.97*  --  1.94* 1.88* 1.82* 1.62*  CALCIUM 8.0*  --  8.2* 8.2* 8.1* 8.0*  MG 2.2 2.4  --  2.5* 2.4 2.5*  PHOS  --  3.4  --  3.2 3.2 3.8    GFR: Estimated Creatinine Clearance: 38 mL/min (Inika Bellanger) (by C-G formula based on SCr of 1.62 mg/dL (H)).  Liver Function Tests: Recent Labs  Lab 11/03/19 2048 11/04/19 1555 11/05/19 0301 11/06/19 0353 11/07/19 0723  AST 23 32 42* 76* 47*  ALT 36 39 51* 108* 115*  ALKPHOS 73 66 71 76 68  BILITOT 1.7* 1.4* 0.7 0.5 0.9  PROT 6.2* 6.2* 5.9* 5.8* 5.9*  ALBUMIN 2.5* 2.3* 2.1* 2.2* 2.2*    CBG: Recent Labs  Lab 11/06/19 1115 11/06/19 1658 11/06/19 2115 11/07/19 0632 11/07/19 1311  GLUCAP 297* 351* 310* 188* 151*     Recent Results (from the past 240 hour(s))  Culture, blood (routine x 2)     Status: None   Collection Time: 11/03/2019  6:52 PM   Specimen: BLOOD LEFT ARM  Result Value Ref Range Status   Specimen Description BLOOD LEFT ARM  Final   Special Requests   Final    BOTTLES DRAWN AEROBIC AND ANAEROBIC Blood Culture adequate volume   Culture   Final    NO GROWTH 5 DAYS Performed at [facility], [address] [place], [place] [address]    Report Status 11/07/2019 FINAL  Final  Culture, blood (routine x 2)     Status: None   Collection Time:  11/05/2019  6:53 PM   Specimen: BLOOD  Result Value Ref Range  Status   Specimen Description BLOOD LEFT ANTECUBITAL  Final   Special Requests   Final    BOTTLES DRAWN AEROBIC AND ANAEROBIC Blood Culture adequate volume   Culture   Final    NO GROWTH 5 DAYS Performed at [facility], [address] [place], [place] [address]    Report Status 11/07/2019 FINAL  Final  MRSA PCR Screening     Status: None   Collection Time: 11/04/19  7:05 PM   Specimen: Nasopharyngeal  Result Value Ref Range Status   MRSA by PCR NEGATIVE NEGATIVE Final    Comment:        The GeneXpert MRSA Assay (FDA approved for NASAL specimens only), is one component of [facility] comprehensive MRSA colonization surveillance program. It is not intended to diagnose MRSA infection nor to guide or monitor treatment for MRSA infections. Performed at [facility], [address] [place], [place] [address]          Radiology Studies: DG CHEST PORT 1 VIEW  Result Date: 11/06/2019 CLINICAL DATA:  COVID-19 positivity with shortness of breath EXAM: PORTABLE CHEST 1 VIEW COMPARISON:  11/02/2018 FINDINGS: Cardiac shadow is accentuated by the portable technique. Bibasilar increasing airspace opacity is noted consistent with the given clinical history of COVID-19 positivity. No sizable effusion is seen. No bony abnormality is noted. IMPRESSION: Increasing bibasilar opacities consistent with the given clinical history. Electronically Signed   By: Alcide CleverMark  Lukens M.D.   On: 11/06/2019 14:11        Scheduled Meds: . amLODipine  10 mg Oral Daily  . vitamin C  500 mg Oral Daily  . atorvastatin  20 mg Oral q1800  . azithromycin  500 mg Oral Daily  . baricitinib  2 mg Oral Daily  . clopidogrel  75 mg Oral Daily  . finasteride  5 mg Oral Daily  . heparin  5,000 Units Subcutaneous Q8H  . insulin aspart  0-15 Units Subcutaneous TID WC  . insulin aspart  0-5 Units Subcutaneous QHS  . insulin aspart  3 Units Subcutaneous TID  WC  . insulin detemir  5 Units Subcutaneous BID  . methylPREDNISolone (SOLU-MEDROL) injection  40 mg Intravenous Q12H  . [START ON 11/08/2019] metoprolol tartrate  12.5 mg Oral BID  . pantoprazole  40 mg Oral QHS  . polyethylene glycol  17 g Oral BID  . zinc sulfate  220 mg Oral Daily   Continuous Infusions: . cefTRIAXone (ROCEPHIN)  IV Stopped (11/06/19 1604)     LOS: 5 days    Time spent: over 30 min    Lacretia Nicksaldwell Powell, MD [facility]   To contact the attending provider between 7A-7P or the covering provider during after hours 7P-7A, please log into the web site [email or web address] and access using universal [place] password for that web site. If you do not have the password, please call the hospital operator.  11/07/2019, 3:21 PM

## 2019-11-08 ENCOUNTER — Inpatient Hospital Stay (HOSPITAL_COMMUNITY): Payer: Medicare HMO

## 2019-11-08 LAB — GLUCOSE, CAPILLARY
Glucose-Capillary: 202 mg/dL — ABNORMAL HIGH (ref 70–99)
Glucose-Capillary: 267 mg/dL — ABNORMAL HIGH (ref 70–99)
Glucose-Capillary: 192 mg/dL — ABNORMAL HIGH (ref 70–99)
Glucose-Capillary: 259 mg/dL — ABNORMAL HIGH (ref 70–99)

## 2019-11-08 LAB — COMPREHENSIVE METABOLIC PANEL
ALT: 106 U/L — ABNORMAL HIGH (ref 0–44)
AST: 40 U/L (ref 15–41)
Albumin: 2 g/dL — ABNORMAL LOW (ref 3.5–5.0)
Alkaline Phosphatase: 59 U/L (ref 38–126)
Anion gap: 9 (ref 5–15)
BUN: 44 mg/dL — ABNORMAL HIGH (ref 8–23)
CO2: 22 mmol/L (ref 22–32)
Calcium: 7.7 mg/dL — ABNORMAL LOW (ref 8.9–10.3)
Chloride: 109 mmol/L (ref 98–111)
Creatinine, Ser: 1.9 mg/dL — ABNORMAL HIGH (ref 0.61–1.24)
GFR calc Af Amer: 40 mL/min — ABNORMAL LOW (ref >60)
GFR calc non Af Amer: 34 mL/min — ABNORMAL LOW (ref >60)
Glucose, Bld: 186 mg/dL — ABNORMAL HIGH (ref 70–99)
Potassium: 4.5 mmol/L (ref 3.5–5.1)
Sodium: 140 mmol/L (ref 135–145)
Total Bilirubin: 0.7 mg/dL (ref 0.3–1.2)
Total Protein: 5.3 g/dL — ABNORMAL LOW (ref 6.5–8.1)

## 2019-11-08 LAB — CBC WITH DIFFERENTIAL/PLATELET
Abs Immature Granulocytes: 0.1 10*3/uL — ABNORMAL HIGH (ref 0.00–0.07)
Basophils Absolute: 0 10*3/uL (ref 0.0–0.1)
Basophils Relative: 0 %
Eosinophils Absolute: 0 10*3/uL (ref 0.0–0.5)
Eosinophils Relative: 0 %
HCT: 38.6 % — ABNORMAL LOW (ref 39.0–52.0)
Hemoglobin: 13.2 g/dL (ref 13.0–17.0)
Immature Granulocytes: 1 %
Lymphocytes Relative: 4 %
Lymphs Abs: 0.4 10*3/uL — ABNORMAL LOW (ref 0.7–4.0)
MCH: 30.7 pg (ref 26.0–34.0)
MCHC: 34.2 g/dL (ref 30.0–36.0)
MCV: 89.8 fL (ref 80.0–100.0)
Monocytes Absolute: 0.3 10*3/uL (ref 0.1–1.0)
Monocytes Relative: 2 %
Neutro Abs: 9.6 10*3/uL — ABNORMAL HIGH (ref 1.7–7.7)
Neutrophils Relative %: 93 %
Platelets: 162 10*3/uL (ref 150–400)
RBC: 4.3 MIL/uL (ref 4.22–5.81)
RDW: 13.9 % (ref 11.5–15.5)
WBC: 10.4 10*3/uL (ref 4.0–10.5)
nRBC: 0 % (ref 0.0–0.2)

## 2019-11-08 LAB — HEPARIN LEVEL (UNFRACTIONATED): Heparin Unfractionated: 1.94 IU/mL — ABNORMAL HIGH (ref 0.30–0.70)

## 2019-11-08 LAB — C-REACTIVE PROTEIN: CRP: 1.1 mg/dL — ABNORMAL HIGH (ref <1.0)

## 2019-11-08 LAB — MAGNESIUM: Magnesium: 2.3 mg/dL (ref 1.7–2.4)

## 2019-11-08 LAB — PHOSPHORUS: Phosphorus: 3.3 mg/dL (ref 2.5–4.6)

## 2019-11-08 LAB — D-DIMER, QUANTITATIVE: D-Dimer, Quant: 1.13 ug/mL-FEU — ABNORMAL HIGH (ref 0.00–0.50)

## 2019-11-08 LAB — FERRITIN: Ferritin: 875 ng/mL — ABNORMAL HIGH (ref 24–336)

## 2019-11-08 MED ORDER — HEPARIN (PORCINE) 25000 UT/250ML-% IV SOLN
1300.0000 [IU]/h | INTRAVENOUS | Status: DC
  Administered 2019-11-08: 1300 [IU]/h via INTRAVENOUS
  Filled 2019-11-08: qty 250

## 2019-11-08 MED ORDER — TECHNETIUM TO 99M ALBUMIN AGGREGATED
4.4000 | Freq: Once | INTRAVENOUS | Status: AC | PRN
  Administered 2019-11-08: 4.4 via INTRAVENOUS

## 2019-11-08 MED ORDER — LACTATED RINGERS IV SOLN: INTRAVENOUS | Status: AC

## 2019-11-08 MED ORDER — INSULIN DETEMIR 100 UNIT/ML ~~LOC~~ SOLN
7.0000 [IU] | Freq: Two times a day (BID) | SUBCUTANEOUS | Status: DC
  Administered 2019-11-08 – 2019-11-11 (×6): 7 [IU] via SUBCUTANEOUS
  Filled 2019-11-08 (×8): qty 0.07

## 2019-11-08 MED ORDER — HEPARIN BOLUS VIA INFUSION
3000.0000 [IU] | Freq: Once | INTRAVENOUS | Status: AC
  Administered 2019-11-08: 3000 [IU] via INTRAVENOUS
  Filled 2019-11-08: qty 3000

## 2019-11-08 MED ORDER — INSULIN ASPART 100 UNIT/ML ~~LOC~~ SOLN
5.0000 [IU] | Freq: Three times a day (TID) | SUBCUTANEOUS | Status: DC
  Administered 2019-11-08 – 2019-11-11 (×8): 5 [IU] via SUBCUTANEOUS

## 2019-11-08 NOTE — Progress Notes (Signed)
Pt transported off unit to nuclear med for scan. pt transported off unit via bed with IV intact and transfusing. Dionne Bucy RN

## 2019-11-08 NOTE — Progress Notes (Signed)
OT Cancellation Note  Patient Details Name: Robert Buckley MRN: [id number] DOB: 1945-07-01   Cancelled Treatment:    Reason Eval/Treat Not Completed: Patient not medically ready Pt diagnosed with a PE earlier this morning, will continue to follow when medically stable.   Pollyann Glen E. Courtnei Ruddell, COTA/L Acute Rehabilitation Services [phone]  Robert Buckley 11/08/2019, 1:05 PM

## 2019-11-08 NOTE — Progress Notes (Signed)
PT Cancellation Note  Patient Details Name: Robert Buckley MRN: [id number] DOB: 25-Sep-1945   Cancelled Treatment:    Reason Eval/Treat Not Completed: Patient not medically ready  Noted patient with ?pulmonary infarction by chest CT with pharmacy asked to dose for Heparin for PE.   Per standard of care, PT will hold activity until has been heparinized x 24 hours.    Jerolyn Center, PT Pager [phone]   Zena Amos 11/08/2019, 12:59 PM

## 2019-11-08 NOTE — Progress Notes (Signed)
ANTICOAGULATION CONSULT NOTE - Initial Consult  Pharmacy Consult for heparin Indication: pulmonary embolus  Allergies  Allergen Reactions  . Sulfa Antibiotics Rash    Patient Measurements: Height: 5\' 7"  (170.2 cm) Weight: 79 kg (174 lb 2.6 oz) IBW/kg (Calculated) : 66.1 Heparin Dosing Weight: 79 kg  Vital Signs: Temp: 98 F (36.7 C) (09/27 0908) Temp Source: Oral (09/27 0908) BP: 126/83 (09/27 0908) Pulse Rate: 72 (09/27 0908)  Labs: Recent Labs    11/06/19 0353 11/06/19 0353 11/07/19 0723 11/08/19 0302  HGB 14.1   < > 14.8 13.2  HCT 41.8  --  44.8 38.6*  PLT 165  --  154 162  CREATININE 1.82*  --  1.62* 1.90*   < > = values in this interval not displayed.   Estimated Creatinine Clearance: 32.4 mL/min (A) (by C-G formula based on SCr of 1.9 mg/dL (H)).  Medical History: Past Medical History:  Diagnosis Date  . Arthritis   . Hypertension   . Stroke [facility]) 2008   residual R sided weakness    Medications:  Medications Prior to Admission  Medication Sig Dispense Refill Last Dose  . amLODipine (NORVASC) 10 MG tablet Take 10 mg by mouth daily.   11/05/2019 at Unknown time  . ascorbic acid (VITAMIN C) 500 MG tablet Take 1 tablet (500 mg total) by mouth daily. 30 tablet 0 11/09/2019 at Unknown time  . atorvastatin (LIPITOR) 20 MG tablet Take 20 mg by mouth daily.   10/20/2019 at Unknown time  . clopidogrel (PLAVIX) 75 MG tablet Take 1 tablet (75 mg total) by mouth daily.   11/09/2019 at Unknown time  . finasteride (PROSCAR) 5 MG tablet Take 1 tablet (5 mg total) by mouth daily.   10/29/2019 at Unknown time  . metoprolol succinate (TOPROL-XL) 50 MG 24 hr tablet Take 1 tablet (50 mg total) by mouth daily. Take with or immediately following a meal.   10/19/2019 at 0800  . Vitamin D3 (VITAMIN D) 25 MCG tablet Take 1 tablet (1,000 Units total) by mouth daily. 30 tablet 0 10/30/2019 at Unknown time  . zinc sulfate 220 (50 Zn) MG capsule Take 1 capsule (220 mg total) by mouth daily.  30 capsule 0 11/03/2019 at Unknown time  . albuterol (VENTOLIN HFA) 108 (90 Base) MCG/ACT inhaler Inhale 2 puffs into the lungs every 6 (six) hours as needed for wheezing or shortness of breath. (Patient not taking: Reported on 11/03/2019) 8 g 2 Not Taking at Unknown time  . benzonatate (TESSALON PERLES) 100 MG capsule Take 1 capsule (100 mg total) by mouth every 6 (six) hours as needed for cough. (Patient not taking: Reported on 11/03/2019) 30 capsule 0 Completed Course at Unknown time    Assessment: 11/05/2019 an 74 y.o.malewith past medical history of hypertension, CVA with residual right-sided weakness, CKD stage IIIa, obese, hyperlipidemia, recent COVID-19 infection (admitted 9/13-15, s/p 5 doses Remdesivir).  Pharmacy consulted to dose heparin for pulmonary embolism. Patient did receive a 5000 unit subcutaneous dose of heparin at 0639 this morning. Baseline aPTT 28, INR/PT 1.3/15.2 (from 9/21)  Goal of Therapy:  Heparin level 0.3-0.7 units/ml Monitor platelets by anticoagulation protocol: Yes   Plan:  Give 3000 units bolus x 1 Start heparin infusion at 1300 units/hr Check anti-Xa level in 8 hours and daily while on heparin Continue to monitor H&H and platelets   Thank you for allowing 10/21 to participate in this patients care.   [place], PharmD Please see amion for complete clinical  pharmacist phone list. 11/08/2019 12:45 PM

## 2019-11-08 NOTE — Progress Notes (Addendum)
PROGRESS NOTE    Robert Buckley  ZOX:[id number] DOB: 1945-09-14 DOA: 11/01/2019 PCP: No primary care provider on file.   Chief Complaint  Patient presents with  . Code Stroke    Brief Narrative:  Robert Buckley is an 74 y.o. male with past medical history of hypertension, CVA with residual right-sided weakness, CKD stage IIIa, obese, hyperlipidemia, recent COVID-19 infection presents to emergency department with right arm weakness.   Patient presented to ER with confusion and worsening right-sided weakness at 3 PM.  EMS noted patient's oxygen saturation 90% was placed on 2 L of oxygen via nasal cannula.  Code stroke was activated and patient was evaluated by neurology.  Initial CT concerning for thalamus hemorrhage however MRI brain came back negative shows findings most consistent with small focus of mineralization/calcification.  No intracranial hemorrhage.  MRA head and neck came back negative for acute findings.  Initially his confusion was thought to be related to ICH.   He denies alcohol, smoking, illicit drug use.  Of note: Patient admitted on 9/13 with acute hypoxemic respiratory failure secondary to COVID-19 pneumonia and discharged home in stable condition on 9/15.  He received 3 out of 5 doses of remdesivir while he was hospitalized and received 2 remaining doses outpatient.  He discharged home on 10 days of steroids.  Upon arrival to ED: Patient had fever of 101.2, tachypneic, hypoxic requiring 2 L of oxygen via nasal cannula, lactic acid: 2.1, WBC: 17,000, PCT: 0.44, D-dimer: 1.08, UA positive for rare bacteria.  CMP shows worsening kidney function.  Chest x-ray shows mild right lung infiltrate increased when compared to prior study.  Patient received Rocephin and azithromycin in ED.  [facility] hospitalist consulted for acute hypoxemic respiratory failure due to COVID-19 pneumonia.  Assessment & Plan:   Principal Problem:   Acute metabolic encephalopathy Active Problems:    Essential hypertension   Dyslipidemia   Acute on chronic kidney failure (HCC)   Right hemiparesis (HCC)   Pneumonia due to COVID-19 virus   Acute hypoxemic respiratory failure (HCC)  Acute metabolic encephalopathy  Recrudescence of Chronic L Thalamic Infarct: -Likely in the setting of COVID-19 pneumonia - MRI brain without acute intracranial abnormality.  No acute intracranial hemorrhage.  Moderately advanced age related cerebral atrophy with chronic microvascular ischemic disease with Robert Buckley few scattered remote lacunar infarcts about the bilateral basal ganglia, thalami, and hemispheric cerebral white matter.  Negative intracranial MRA.  Negative MRA of neck. - he's Robert Buckley&Ox3 today, seems to have improved - continue delirium precautions - UA not c/w UTI, follow blood cultures - B12, folate, ammonia, RPR not collected as he's been refusing labs -> will check these tomorrow, though he seems improved.  Negative urine tox, ethanol levels - seen initially by neurology for code stroke, but MRI with no acute abnormality and he's since improved - suspected 2/2 covid encephalopathy and recrudescence of his chronic L thalamic infarct - pt/ot/slp eval, appreciate recs  COVID 19 Pneumonia  Possible Bacterial Pneumonia  Fever:  Recently treated for covid with steroids and remdesivir (2 days completed outpatient) Weaning as tolerated - 10 L [place], discussed with RN CXR 9/25 with increasing bibasilar opacities  CT PE protocol without central or segmental PE, R lung interstitial opacities  CXR 9/27 with progressive right upper lobe atelectasis, wedge shaped density in R upper lobe, can't exclude pulm infarct Will continue steroids (9/22-present) and antibiotics (ceftriaxone/azithromycin 9/23 - present).  baricitinib (9/22-23, 9/25-present) with worsening resp status (discussed risks/benefits with family). Fever noted 9/22 AM  Procalcitonin peaked at 0.78, 0.42 today CRP improving now Follow blood cx, sputum cx,  MRSA pcr (negative) Prone as able, OOB, IS, flutter Continue airborne/contact precautions  COVID-19 Labs  Recent Labs    11/06/19 0353 11/07/19 0723 11/08/19 0302  DDIMER 1.03* 1.02* 1.13*  FERRITIN 1,172* 1,054* 875*  CRP 5.4* 2.2* 1.1*    Lab Results  Component Value Date   SARSCOV2NAA POSITIVE (Daianna Vasques) 10/24/2019   Elevated D dimer  Possible Pulm Infarct:  In setting of covid, will follow VQ scan (low prob) and LE [place].  Will empirically start heparin gtt.  AKI on CKD stage IIIb  NAGMA: -Creatinine: 2.07, GFR: 36 (baseline creatinine: 1.62, GFR: 48) -creatinine bumped again today, continue to monitor, restart IVF  Elevated BNP  Elevated Troponin:  BNP significantly elevated, ? Related to CKD/AKI Elevated troponin, mild, suspect 2/2 demand in setting of above.  Delta < 20. Follow echocardiogram - normal EF, grade 1 diastolic dysfunction (see report)  Leukocytosis: Likely secondary to recent steroid use vs possible bacterial infection Continue antibiotics  Hypokalemia: follow, improved  History of stroke with right-sided residual weakness: -Can resume home p.o. meds-statin, plavix  Hypertension: Stable -Continue home meds amlodipine and metoprolol  Sinus Tachycardia:  - improved  BPH: Continue Proscar  Thrombocytopenia: -improved.   Prediabetes  Steroid Induced Hyperglycemia A1c 6.2, basal/bolus, adjust as needed  DVT prophylaxis: heparin Code Status: full  Family Communication: none at bedside - son 9/27 Disposition:   Status is: Inpatient  Remains inpatient appropriate because:Inpatient level of care appropriate due to severity of illness   Dispo: The patient is from: Home              Anticipated d/c is to: pending              Anticipated d/c date is: > 3 days              Patient currently is not medically stable to d/c.   Consultants:   neurology  Procedures:  Echo IMPRESSIONS    1. Left ventricular ejection fraction, by  estimation, is 60 to 65%. The  left ventricle has normal function. The left ventricle has no regional  wall motion abnormalities. Left ventricular diastolic parameters are  consistent with Grade I diastolic  dysfunction (impaired relaxation).  2. Right ventricular systolic function was not well visualized. The right  ventricular size is not well visualized.  3. The mitral valve was not well visualized. No evidence of mitral valve  regurgitation.  4. The aortic valve was not well visualized. Aortic valve regurgitation  is not visualized.   Antimicrobials: ( Anti-infectives (From admission, onward)   Start     Dose/Rate Route Frequency Ordered Stop   11/04/19 1600  cefTRIAXone (ROCEPHIN) 2 g in sodium chloride 0.9 % 100 mL IVPB        2 g 200 mL/hr over 30 Minutes Intravenous Every 24 hours 11/04/19 1445 11/09/19 1559   11/04/19 1600  azithromycin (ZITHROMAX) tablet 500 mg        500 mg Oral Daily 11/04/19 1445 11/08/19 0942   11/07/2019 1830  cefTRIAXone (ROCEPHIN) 1 g in sodium chloride 0.9 % 100 mL IVPB        1 g 200 mL/hr over 30 Minutes Intravenous  Once 11/11/2019 1821 10/22/2019 1937   11/08/2019 1830  azithromycin (ZITHROMAX) 500 mg in sodium chloride 0.9 % 250 mL IVPB        500 mg 250 mL/hr over 60 Minutes Intravenous  Once 10/25/2019 1821 10/28/2019 2221         Subjective: No new complaints today Stoic   Objective: Vitals:   11/08/19 0026 11/08/19 0500 11/08/19 0908 11/08/19 1237  BP: 132/71 129/73 126/83 123/73  Pulse: 67  72 79  Resp: 18 (!) 21 19 20   Temp: 98.6 F (37 C) (!) 97.5 F (36.4 C) 98 F (36.7 C) 98.6 F (37 C)  TempSrc: Oral Oral Oral Oral  SpO2: 96%  94% 90%  Weight:      Height:        Intake/Output Summary (Last 24 hours) at 11/08/2019 1448 Last data filed at 11/08/2019 1434 Gross per 24 hour  Intake 1143.88 ml  Output 1150 ml  Net -6.12 ml   Filed Weights   11/04/19 0305  Weight: 79 kg    Examination:  General: No acute  distress. Cardiovascular: Heart sounds show Cal Gindlesperger regular rate, and rhythm.  Lungs: Clear to auscultation bilaterally. Abdomen: Soft, nontender, nondistended  Neurological: Alert and oriented 3. Moves all extremities 4. Cranial nerves II through XII grossly intact. Skin: Warm and dry. No rashes or lesions. Extremities: No clubbing or cyanosis. No edema.   Data Reviewed: I have personally reviewed following labs and imaging studies  CBC: Recent Labs  Lab 11/04/19 1335 11/04/19 1335 11/04/19 1550 11/05/19 1807 11/06/19 0353 11/07/19 0723 11/08/19 0302  WBC 19.7*   < > 22.6* 22.6* 14.1* 12.8* 10.4  NEUTROABS 18.9*  --   --  21.4* 13.2* 11.7* 9.6*  HGB 15.1   < > 15.8 15.7 14.1 14.8 13.2  HCT 44.7   < > 46.7 47.5 41.8 44.8 38.6*  MCV 90.7   < > 90.5 92.6 91.1 94.1 89.8  PLT 171   < > 177 175 165 154 162   < > = values in this interval not displayed.    Basic Metabolic Panel: Recent Labs  Lab 11/03/19 2048 11/04/19 1335 11/04/19 1555 11/05/19 0301 11/06/19 0353 11/07/19 0723 11/08/19 0302  NA   < >  --  146* 145 141 141 140  K   < >  --  4.2 3.8 4.1 4.9 4.5  CL   < >  --  112* 115* 112* 110 109  CO2   < >  --  20* 20* 20* 19* 22  GLUCOSE   < >  --  311* 253* 295* 163* 186*  BUN   < >  --  34* 38* 40* 41* 44*  CREATININE   < >  --  1.94* 1.88* 1.82* 1.62* 1.90*  CALCIUM   < >  --  8.2* 8.2* 8.1* 8.0* 7.7*  MG  --  2.4  --  2.5* 2.4 2.5* 2.3  PHOS  --  3.4  --  3.2 3.2 3.8 3.3   < > = values in this interval not displayed.    GFR: Estimated Creatinine Clearance: 32.4 mL/min (Kyston Gonce) (by C-G formula based on SCr of 1.9 mg/dL (H)).  Liver Function Tests: Recent Labs  Lab 11/04/19 1555 11/05/19 0301 11/06/19 0353 11/07/19 0723 11/08/19 0302  AST 32 42* 76* 47* 40  ALT 39 51* 108* 115* 106*  ALKPHOS 66 71 76 68 59  BILITOT 1.4* 0.7 0.5 0.9 0.7  PROT 6.2* 5.9* 5.8* 5.9* 5.3*  ALBUMIN 2.3* 2.1* 2.2* 2.2* 2.0*    CBG: Recent Labs  Lab 11/07/19 1311 11/07/19 1721  11/07/19 2213 11/08/19 0632 11/08/19 1241  GLUCAP 151* 219* 181* 202* 267*  Recent Results (from the past 240 hour(s))  Culture, blood (routine x 2)     Status: None   Collection Time: 10/26/2019  6:52 PM   Specimen: BLOOD LEFT ARM  Result Value Ref Range Status   Specimen Description BLOOD LEFT ARM  Final   Special Requests   Final    BOTTLES DRAWN AEROBIC AND ANAEROBIC Blood Culture adequate volume   Culture   Final    NO GROWTH 5 DAYS Performed at [facility], [address] [place], [place] [address]    Report Status 11/07/2019 FINAL  Final  Culture, blood (routine x 2)     Status: None   Collection Time: 10/31/2019  6:53 PM   Specimen: BLOOD  Result Value Ref Range Status   Specimen Description BLOOD LEFT ANTECUBITAL  Final   Special Requests   Final    BOTTLES DRAWN AEROBIC AND ANAEROBIC Blood Culture adequate volume   Culture   Final    NO GROWTH 5 DAYS Performed at [facility], [address] [place], [place] [address]    Report Status 11/07/2019 FINAL  Final  MRSA PCR Screening     Status: None   Collection Time: 11/04/19  7:05 PM   Specimen: Nasopharyngeal  Result Value Ref Range Status   MRSA by PCR NEGATIVE NEGATIVE Final    Comment:        The GeneXpert MRSA Assay (FDA approved for NASAL specimens only), is one component of Aslan Montagna comprehensive MRSA colonization surveillance program. It is not intended to diagnose MRSA infection nor to guide or monitor treatment for MRSA infections. Performed at [facility], [address] [place], [place] [address]          Radiology Studies: DG CHEST PORT 1 VIEW  Result Date: 11/08/2019 CLINICAL DATA:  COVID.  Hypoxia. EXAM: PORTABLE CHEST 1 VIEW COMPARISON:  11/06/2019. FINDINGS: Mediastinum stable. Heart size stable. Progressive right upper lobe atelectasis. Wedge-shaped density noted in the right upper lobe. Pulmonary infarct cannot be excluded. Persistent bilateral interstitial  infiltrates are again noted. No pleural effusion or pneumothorax. Left costophrenic angle incompletely imaged. No acute bony abnormality. IMPRESSION: 1. Progressive right upper lobe atelectasis. Wedge-shaped density noted in the right upper lobe. Pulmonary infarct cannot be excluded. 2.  Persistent bilateral interstitial infiltrates are again noted. Electronically Signed   By: Maisie Fus  Register   On: 11/08/2019 05:27        Scheduled Meds: . amLODipine  10 mg Oral Daily  . vitamin C  500 mg Oral Daily  . atorvastatin  20 mg Oral q1800  . baricitinib  2 mg Oral Daily  . clopidogrel  75 mg Oral Daily  . finasteride  5 mg Oral Daily  . insulin aspart  0-15 Units Subcutaneous TID WC  . insulin aspart  0-5 Units Subcutaneous QHS  . insulin aspart  3 Units Subcutaneous TID WC  . insulin detemir  5 Units Subcutaneous BID  . methylPREDNISolone (SOLU-MEDROL) injection  40 mg Intravenous Q12H  . metoprolol tartrate  12.5 mg Oral BID  . pantoprazole  40 mg Oral QHS  . polyethylene glycol  17 g Oral BID  . zinc sulfate  220 mg Oral Daily   Continuous Infusions: . cefTRIAXone (ROCEPHIN)  IV Stopped (11/07/19 1753)  . heparin 1,300 Units/hr (11/08/19 1434)     LOS: 6 days    Time spent: over 30 min    Lacretia Nicks, MD [facility]   To contact  the attending provider between 7A-7P or the covering provider during after hours 7P-7A, please log into the web site [email or web address] and access using universal [place] password for that web site. If you do not have the password, please call the hospital operator.  11/08/2019, 2:48 PM

## 2019-11-09 ENCOUNTER — Inpatient Hospital Stay (HOSPITAL_COMMUNITY): Payer: Medicare HMO

## 2019-11-09 DIAGNOSIS — G9341 Metabolic encephalopathy: Secondary | ICD-10-CM

## 2019-11-09 DIAGNOSIS — U071 COVID-19: Secondary | ICD-10-CM

## 2019-11-09 DIAGNOSIS — R7989 Other specified abnormal findings of blood chemistry: Secondary | ICD-10-CM

## 2019-11-09 LAB — COMPREHENSIVE METABOLIC PANEL
ALT: 107 U/L — ABNORMAL HIGH (ref 0–44)
AST: 31 U/L (ref 15–41)
Albumin: 2.1 g/dL — ABNORMAL LOW (ref 3.5–5.0)
Alkaline Phosphatase: 58 U/L (ref 38–126)
Anion gap: 6 (ref 5–15)
BUN: 46 mg/dL — ABNORMAL HIGH (ref 8–23)
CO2: 23 mmol/L (ref 22–32)
Calcium: 7.7 mg/dL — ABNORMAL LOW (ref 8.9–10.3)
Chloride: 110 mmol/L (ref 98–111)
Creatinine, Ser: 1.74 mg/dL — ABNORMAL HIGH (ref 0.61–1.24)
GFR calc Af Amer: 44 mL/min — ABNORMAL LOW (ref >60)
GFR calc non Af Amer: 38 mL/min — ABNORMAL LOW (ref >60)
Glucose, Bld: 201 mg/dL — ABNORMAL HIGH (ref 70–99)
Potassium: 5 mmol/L (ref 3.5–5.1)
Sodium: 139 mmol/L (ref 135–145)
Total Bilirubin: 0.5 mg/dL (ref 0.3–1.2)
Total Protein: 5 g/dL — ABNORMAL LOW (ref 6.5–8.1)

## 2019-11-09 LAB — CBC WITH DIFFERENTIAL/PLATELET
Abs Immature Granulocytes: 0.24 10*3/uL — ABNORMAL HIGH (ref 0.00–0.07)
Basophils Absolute: 0 10*3/uL (ref 0.0–0.1)
Basophils Relative: 0 %
Eosinophils Absolute: 0 10*3/uL (ref 0.0–0.5)
Eosinophils Relative: 0 %
HCT: 38.9 % — ABNORMAL LOW (ref 39.0–52.0)
Hemoglobin: 13.6 g/dL (ref 13.0–17.0)
Immature Granulocytes: 1 %
Lymphocytes Relative: 3 %
Lymphs Abs: 0.6 10*3/uL — ABNORMAL LOW (ref 0.7–4.0)
MCH: 31.8 pg (ref 26.0–34.0)
MCHC: 35 g/dL (ref 30.0–36.0)
MCV: 90.9 fL (ref 80.0–100.0)
Monocytes Absolute: 0.5 10*3/uL (ref 0.1–1.0)
Monocytes Relative: 3 %
Neutro Abs: 15.8 10*3/uL — ABNORMAL HIGH (ref 1.7–7.7)
Neutrophils Relative %: 93 %
Platelets: 190 10*3/uL (ref 150–400)
RBC: 4.28 MIL/uL (ref 4.22–5.81)
RDW: 14.2 % (ref 11.5–15.5)
WBC: 17.1 10*3/uL — ABNORMAL HIGH (ref 4.0–10.5)
nRBC: 0 % (ref 0.0–0.2)

## 2019-11-09 LAB — GLUCOSE, CAPILLARY
Glucose-Capillary: 187 mg/dL — ABNORMAL HIGH (ref 70–99)
Glucose-Capillary: 188 mg/dL — ABNORMAL HIGH (ref 70–99)
Glucose-Capillary: 223 mg/dL — ABNORMAL HIGH (ref 70–99)
Glucose-Capillary: 212 mg/dL — ABNORMAL HIGH (ref 70–99)

## 2019-11-09 LAB — C-REACTIVE PROTEIN: CRP: 0.7 mg/dL (ref <1.0)

## 2019-11-09 LAB — TYPE AND SCREEN
ABO/RH(D): A POS
Antibody Screen: NEGATIVE

## 2019-11-09 LAB — MAGNESIUM: Magnesium: 2.3 mg/dL (ref 1.7–2.4)

## 2019-11-09 LAB — FERRITIN: Ferritin: 841 ng/mL — ABNORMAL HIGH (ref 24–336)

## 2019-11-09 LAB — HEMOGLOBIN AND HEMATOCRIT, BLOOD
HCT: 40.8 % (ref 39.0–52.0)
Hemoglobin: 14 g/dL (ref 13.0–17.0)

## 2019-11-09 LAB — D-DIMER, QUANTITATIVE: D-Dimer, Quant: 0.86 ug/mL-FEU — ABNORMAL HIGH (ref 0.00–0.50)

## 2019-11-09 LAB — PHOSPHORUS: Phosphorus: 3.7 mg/dL (ref 2.5–4.6)

## 2019-11-09 LAB — HEPARIN LEVEL (UNFRACTIONATED): Heparin Unfractionated: 1.8 IU/mL — ABNORMAL HIGH (ref 0.30–0.70)

## 2019-11-09 LAB — ABO/RH: ABO/RH(D): A POS

## 2019-11-09 MED ORDER — PANTOPRAZOLE SODIUM 40 MG IV SOLR
40.0000 mg | Freq: Two times a day (BID) | INTRAVENOUS | Status: DC
  Administered 2019-11-09 – 2019-11-13 (×9): 40 mg via INTRAVENOUS
  Filled 2019-11-09 (×9): qty 40

## 2019-11-09 MED ORDER — CLOPIDOGREL BISULFATE 75 MG PO TABS
75.0000 mg | ORAL_TABLET | Freq: Every day | ORAL | Status: DC
  Administered 2019-11-10 – 2019-11-13 (×4): 75 mg via ORAL
  Filled 2019-11-09 (×4): qty 1

## 2019-11-09 MED ORDER — HEPARIN (PORCINE) 25000 UT/250ML-% IV SOLN
1100.0000 [IU]/h | INTRAVENOUS | Status: DC
  Administered 2019-11-09: 1100 [IU]/h via INTRAVENOUS
  Filled 2019-11-09: qty 250

## 2019-11-09 NOTE — Progress Notes (Signed)
PROGRESS NOTE    Robert Buckley  YQM:[id number] DOB: October 25, 1945 DOA: 2019/11/12 PCP: No primary care provider on file.   Chief Complaint  Patient presents with  . Code Stroke    Brief Narrative:  Robert Buckley is an 74 y.o. male with past medical history of hypertension, CVA with residual right-sided weakness, CKD stage IIIa, obese, hyperlipidemia, recent COVID-19 infection presents to emergency department with right arm weakness.   Patient presented to ER with confusion and worsening right-sided weakness at 3 PM.  EMS noted patient's oxygen saturation 90% was placed on 2 L of oxygen via nasal cannula.  Code stroke was activated and patient was evaluated by neurology.  Initial CT concerning for thalamus hemorrhage however MRI brain came back negative shows findings most consistent with small focus of mineralization/calcification.  No intracranial hemorrhage.  MRA head and neck came back negative for acute findings.  Initially his confusion was thought to be related to ICH.   He denies alcohol, smoking, illicit drug use.  Of note: Patient admitted on 9/13 with acute hypoxemic respiratory failure secondary to COVID-19 pneumonia and discharged home in stable condition on 9/15.  He received 3 out of 5 doses of remdesivir while he was hospitalized and received 2 remaining doses outpatient.  He discharged home on 10 days of steroids.  He was admitted with encephalopathy and right sided weakness and concern for acute stroke.  CT initially with concern for parenchymal hemorrhage and patient admitted to neurology service.  MRI showed no acute intracranial hemorrhage and no acute intracranial abnormality.  Sx thought from recrudescence of prior stroke.  He's now being treated for COVID 19 infection with steroids and baricitinib.  Also on abx.  He's gradually improved.  Hospitalization c/b CXR with possible pulm infarct, placed on anticoagulation.  VQ scan low prob, LE [place] pending.  Concern for blood  in stool 9/28, heparin stopped, GI consult.   Assessment & Plan:   Principal Problem:   Acute metabolic encephalopathy Active Problems:   Essential hypertension   Dyslipidemia   Acute on chronic kidney failure (HCC)   Right hemiparesis (HCC)   Pneumonia due to COVID-19 virus   Acute hypoxemic respiratory failure (HCC)  Acute metabolic encephalopathy  Recrudescence of Chronic L Thalamic Infarct: -Likely in the setting of COVID-19 pneumonia - MRI brain without acute intracranial abnormality.  No acute intracranial hemorrhage.  Moderately advanced age related cerebral atrophy with chronic microvascular ischemic disease with Robert Buckley few scattered remote lacunar infarcts about the bilateral basal ganglia, thalami, and hemispheric cerebral white matter.  Negative intracranial MRA.  Negative MRA of neck. - he's Robert Buckley&Ox3 today, seems to have improved - continue delirium precautions - UA not c/w UTI, follow blood cultures - B12, folate, ammonia, RPR not collected as he's been refusing labs -> will check these tomorrow, though he seems improved.  Negative urine tox, ethanol levels - seen initially by neurology for code stroke, but MRI with no acute abnormality and he's since improved - suspected 2/2 covid encephalopathy and recrudescence of his chronic L thalamic infarct - pt/ot/slp eval, appreciate recs  COVID 19 Pneumonia  Possible Bacterial Pneumonia  Fever:  Recently treated for covid with steroids and remdesivir (2 days completed outpatient) Weaning as tolerated - 10 L [place], discussed with RN CXR 9/25 with increasing bibasilar opacities  CT PE protocol without central or segmental PE, R lung interstitial opacities  CXR 9/27 with progressive right upper lobe atelectasis, wedge shaped density in R upper lobe, can't exclude pulm infarct  Will continue steroids (9/22-present) and antibiotics (ceftriaxone/azithromycin 9/23 - 27).  baricitinib (9/22-23, 9/25-present) with worsening resp status (discussed  risks/benefits with family). Fever noted 9/22 AM Procalcitonin peaked at 0.78, 0.42  CRP improving now Follow blood cx, sputum cx, MRSA pcr (negative) Prone as able, OOB, IS, flutter Continue airborne/contact precautions  COVID-19 Labs  Recent Labs    11/07/19 0723 11/08/19 0302 11/09/19 0653  DDIMER 1.02* 1.13* 0.86*  FERRITIN 1,054* 875* 841*  CRP 2.2* 1.1* 0.7    Lab Results  Component Value Date   SARSCOV2NAA POSITIVE (Robert Buckley) 10/24/2019   Elevated D dimer  Possible Pulm Infarct:  In setting of covid, will follow VQ scan (low prob for PE).  LE [place] pending.  He was on heparin, but with concern for blood in stool, this was discontinued today.  Hopefully LE [place] will be negative and no anticoagulation will be indicated (consider repeat CT PE protocol if renal function allows).  Concern for blood in stool:  Concern for melena per RN, x1 episode 9/28, this is while he was on heparin gtt Stop anticoagulation, follow LE [place] to help determine next steps PPI BID GI c/s, appreciate recs (per discussion hold anticoagulation, ok with restarting plavix - will restart 9/29)  AKI on CKD stage IIIb  NAGMA: -Creatinine: 2.07, GFR: 36 (baseline creatinine: 1.62, GFR: 48) -creatinine fluctuating, continue to monitor, follow with IVF  Elevated BNP  Elevated Troponin:  BNP significantly elevated, ? Related to CKD/AKI Elevated troponin, mild, suspect 2/2 demand in setting of above.  Delta < 20. Follow echocardiogram - normal EF, grade 1 diastolic dysfunction (see report)  Leukocytosis: Likely secondary to recent steroid use vs possible bacterial infection Continue antibiotics  Hypokalemia: follow, improved  History of stroke with right-sided residual weakness: -Can resume home p.o. meds-statin, plavix  Hypertension: Stable -Continue home meds amlodipine and metoprolol  Sinus Tachycardia:  - improved  BPH: Continue Proscar  Thrombocytopenia: -improved.   Prediabetes   Steroid Induced Hyperglycemia A1c 6.2, basal/bolus, adjust as needed  DVT prophylaxis: heparin Code Status: full  Family Communication: none at bedside - son 9/28 Disposition:   Status is: Inpatient  Remains inpatient appropriate because:Inpatient level of care appropriate due to severity of illness   Dispo: The patient is from: Home              Anticipated d/c is to: pending              Anticipated d/c date is: > 3 days              Patient currently is not medically stable to d/c.   Consultants:   neurology  Procedures:  Echo IMPRESSIONS    1. Left ventricular ejection fraction, by estimation, is 60 to 65%. The  left ventricle has normal function. The left ventricle has no regional  wall motion abnormalities. Left ventricular diastolic parameters are  consistent with Grade I diastolic  dysfunction (impaired relaxation).  2. Right ventricular systolic function was not well visualized. The right  ventricular size is not well visualized.  3. The mitral valve was not well visualized. No evidence of mitral valve  regurgitation.  4. The aortic valve was not well visualized. Aortic valve regurgitation  is not visualized.   Antimicrobials: ( Anti-infectives (From admission, onward)   Start     Dose/Rate Route Frequency Ordered Stop   11/04/19 1600  cefTRIAXone (ROCEPHIN) 2 g in sodium chloride 0.9 % 100 mL IVPB  2 g 200 mL/hr over 30 Minutes Intravenous Every 24 hours 11/04/19 1445 11/09/19 0600   11/04/19 1600  azithromycin (ZITHROMAX) tablet 500 mg        500 mg Oral Daily 11/04/19 1445 11/08/19 0942   10/22/2019 1830  cefTRIAXone (ROCEPHIN) 1 g in sodium chloride 0.9 % 100 mL IVPB        1 g 200 mL/hr over 30 Minutes Intravenous  Once 11/05/2019 1821 11/08/2019 1937   10/16/2019 1830  azithromycin (ZITHROMAX) 500 mg in sodium chloride 0.9 % 250 mL IVPB        500 mg 250 mL/hr over 60 Minutes Intravenous  Once 11/06/2019 1821 10/23/2019 2221          Subjective: No new complaints, he remains generally stoic Wants Grover [place]field cheeseburger  Objective: Vitals:   11/09/19 0951 11/09/19 1033 11/09/19 1134 11/09/19 1634  BP:  (!) 140/96 134/87 136/80  Pulse: 83 70 62 90  Resp:  Temp:  97.7 F (36.5 C) 98.2 F (36.8 C) 98.5 F (36.9 C)  TempSrc:  Oral Oral Oral  SpO2:  93% 93% 95%  Weight:      Height:        Intake/Output Summary (Last 24 hours) at 11/09/2019 1710 Last data filed at 11/09/2019 1641 Gross per 24 hour  Intake 5026.58 ml  Output 2375 ml  Net 2651.58 ml   Filed Weights   11/04/19 0305  Weight: 79 kg    Examination:  General: No acute distress. Cardiovascular: Heart sounds show Creasie Lacosse regular rate, and rhythm.  Lungs: Clear to auscultation bilaterally  Abdomen: Soft, nontender, nondistended Neurological: Alert and oriented 3. Moves all extremities 4 . Cranial nerves II through XII grossly intact. Skin: Warm and dry. No rashes or lesions. Extremities: No clubbing or cyanosis. No edema.   Data Reviewed: I have personally reviewed following labs and imaging studies  CBC: Recent Labs  Lab 11/05/19 1807 11/05/19 1807 11/06/19 0353 11/07/19 0723 11/08/19 0302 11/09/19 0653 11/09/19 1115  WBC 22.6*  --  14.1* 12.8* 10.4 17.1*  --   NEUTROABS 21.4*  --  13.2* 11.7* 9.6* 15.8*  --   HGB 15.7   < > 14.1 14.8 13.2 13.6 14.0  HCT 47.5   < > 41.8 44.8 38.6* 38.9* 40.8  MCV 92.6  --  91.1 94.1 89.8 90.9  --   PLT 175  --  165 154 162 190  --    < > = values in this interval not displayed.    Basic Metabolic Panel: Recent Labs  Lab 11/05/19 0301 11/06/19 0353 11/07/19 0723 11/08/19 0302 11/09/19 0653  NA 145 141 141 140 139  K 3.8 4.1 4.9 4.5 5.0  CL 115* 112* 110 109 110  CO2 20* 20* 19* 22 23  GLUCOSE 253* 295* 163* 186* 201*  BUN 38* 40* 41* 44* 46*  CREATININE 1.88* 1.82* 1.62* 1.90* 1.74*  CALCIUM 8.2* 8.1* 8.0* 7.7* 7.7*  MG 2.5* 2.4 2.5* 2.3 2.3  PHOS 3.2 3.2 3.8 3.3 3.7     GFR: Estimated Creatinine Clearance: 35.4 mL/min (Kateria Cutrona) (by C-G formula based on SCr of 1.74 mg/dL (H)).  Liver Function Tests: Recent Labs  Lab 11/05/19 0301 11/06/19 0353 11/07/19 0723 11/08/19 0302 11/09/19 0653  AST 42* 76* 47* 40 31  ALT 51* 108* 115* 106* 107*  ALKPHOS 71 76 68 59 58  BILITOT 0.7 0.5 0.9 0.7 0.5  PROT 5.9* 5.8* 5.9* 5.3* 5.0*  ALBUMIN 2.1*  2.2* 2.2* 2.0* 2.1*    CBG: Recent Labs  Lab 11/08/19 1554 11/08/19 2114 11/09/19 0620 11/09/19 1132 11/09/19 1629  GLUCAP 192* 259* 187* 188* 223*     Recent Results (from the past 240 hour(s))  Culture, blood (routine x 2)     Status: None   Collection Time: 10/15/2019  6:52 PM   Specimen: BLOOD LEFT ARM  Result Value Ref Range Status   Specimen Description BLOOD LEFT ARM  Final   Special Requests   Final    BOTTLES DRAWN AEROBIC AND ANAEROBIC Blood Culture adequate volume   Culture   Final    NO GROWTH 5 DAYS Performed at [facility], [address] [place], [place] [address]    Report Status 11/07/2019 FINAL  Final  Culture, blood (routine x 2)     Status: None   Collection Time: 11/03/2019  6:53 PM   Specimen: BLOOD  Result Value Ref Range Status   Specimen Description BLOOD LEFT ANTECUBITAL  Final   Special Requests   Final    BOTTLES DRAWN AEROBIC AND ANAEROBIC Blood Culture adequate volume   Culture   Final    NO GROWTH 5 DAYS Performed at [facility], [address] [place], [place] [address]    Report Status 11/07/2019 FINAL  Final  MRSA PCR Screening     Status: None   Collection Time: 11/04/19  7:05 PM   Specimen: Nasopharyngeal  Result Value Ref Range Status   MRSA by PCR NEGATIVE NEGATIVE Final    Comment:        The GeneXpert MRSA Assay (FDA approved for NASAL specimens only), is one component of Correna Meacham comprehensive MRSA colonization surveillance program. It is not intended to diagnose MRSA infection nor to guide or monitor treatment for MRSA  infections. Performed at [facility], [address] [place], [place] [address]          Radiology Studies: NM Pulmonary Perfusion  Result Date: 11/08/2019 CLINICAL DATA:  Hypoxia.  Positive D-dimer study. EXAM: NUCLEAR MEDICINE PERFUSION LUNG SCAN TECHNIQUE: Perfusion images were obtained in multiple projections after intravenous injection of radiopharmaceutical. Views: Anterior, posterior, left lateral, right lateral, RPO, LPO, RAO, LAO RADIOPHARMACEUTICALS:  4.4 mCi Tc-6539m MAA IV COMPARISON:  Chest radiograph November 08, 2019 and CT angiogram chest November 04, 2019 FINDINGS: There is apparent photopenia throughout significant portions of each upper lobe. Elsewhere distribution of radiotracer uptake is normal. Note that there is airspace consolidation in Finn Altemose portion of the right upper lobe. CT shows evidence of bullous disease in portions of the left upper lobe. IMPRESSION: Areas of photopenia in each upper lobe region. Pneumonia in the right upper lobe. Bullous disease left upper lobe. It is possible that the areas of photopenia are due to these underlying factors. Radiotracer uptake is otherwise symmetric and unremarkable. The overall constellation of findings suggest Ilyanna Baillargeon relatively low probability for pulmonary embolus. Pulmonary embolus is not excluded, however. If further assessment for pulmonary embolus is felt to be warranted, repeat chest CT angiogram would be Falyn Rubel reasonable consideration. Potentially bilateral lower extremity duplex venous ultrasound could be helpful as well in this circumstance. Electronically Signed   By: Bretta BangWilliam  Woodruff III M.D.   On: 11/08/2019 15:07   DG CHEST PORT 1 VIEW  Result Date: 11/08/2019 CLINICAL DATA:  COVID.  Hypoxia. EXAM: PORTABLE CHEST 1 VIEW COMPARISON:  11/06/2019. FINDINGS: Mediastinum stable. Heart size stable. Progressive right upper lobe atelectasis. Wedge-shaped density noted in the  right upper lobe. Pulmonary infarct cannot be excluded.  Persistent bilateral interstitial infiltrates are again noted. No pleural effusion or pneumothorax. Left costophrenic angle incompletely imaged. No acute bony abnormality. IMPRESSION: 1. Progressive right upper lobe atelectasis. Wedge-shaped density noted in the right upper lobe. Pulmonary infarct cannot be excluded. 2.  Persistent bilateral interstitial infiltrates are again noted. Electronically Signed   By: Maisie Fus  Register   On: 11/08/2019 05:27        Scheduled Meds: . amLODipine  10 mg Oral Daily  . vitamin C  500 mg Oral Daily  . atorvastatin  20 mg Oral q1800  . baricitinib  2 mg Oral Daily  . finasteride  5 mg Oral Daily  . insulin aspart  0-15 Units Subcutaneous TID WC  . insulin aspart  0-5 Units Subcutaneous QHS  . insulin aspart  5 Units Subcutaneous TID WC  . insulin detemir  7 Units Subcutaneous BID  . methylPREDNISolone (SOLU-MEDROL) injection  40 mg Intravenous Q12H  . metoprolol tartrate  12.5 mg Oral BID  . pantoprazole (PROTONIX) IV  40 mg Intravenous Q12H  . polyethylene glycol  17 g Oral BID  . zinc sulfate  220 mg Oral Daily   Continuous Infusions:    LOS: 7 days    Time spent: over 30 min    Lacretia Nicks, MD [facility]   To contact the attending provider between 7A-7P or the covering provider during after hours 7P-7A, please log into the web site [email or web address] and access using universal [place] password for that web site. If you do not have the password, please call the hospital operator.  11/09/2019, 5:10 PM

## 2019-11-09 NOTE — Progress Notes (Signed)
ANTICOAGULATION CONSULT NOTE   Pharmacy Consult for Heparin Indication: pulmonary embolus  Allergies  Allergen Reactions   Sulfa Antibiotics Rash    Patient Measurements: Height: 5\' 7"  (170.2 cm) Weight: 79 kg (174 lb 2.6 oz) IBW/kg (Calculated) : 66.1 Heparin Dosing Weight: 79 kg  Vital Signs: Temp: 98.1 F (36.7 C) (09/28 0038) Temp Source: Oral (09/28 0038) BP: 141/88 (09/28 0038) Pulse Rate: 65 (09/28 0038)  Labs: Recent Labs    11/06/19 0353 11/06/19 0353 11/07/19 0723 11/08/19 0302 11/08/19 2157  HGB 14.1   < > 14.8 13.2  --   HCT 41.8  --  44.8 38.6*  --   PLT 165  --  154 162  --   HEPARINUNFRC  --   --   --   --  1.94*  CREATININE 1.82*  --  1.62* 1.90*  --    < > = values in this interval not displayed.   Estimated Creatinine Clearance: 32.4 mL/min (A) (by C-G formula based on SCr of 1.9 mg/dL (H)).  Medical History: Past Medical History:  Diagnosis Date   Arthritis    Hypertension    Stroke [facility]) 2008   residual R sided weakness    Medications:  Medications Prior to Admission  Medication Sig Dispense Refill Last Dose   amLODipine (NORVASC) 10 MG tablet Take 10 mg by mouth daily.   11/05/2019 at Unknown time   ascorbic acid (VITAMIN C) 500 MG tablet Take 1 tablet (500 mg total) by mouth daily. 30 tablet 0 10/19/2019 at Unknown time   atorvastatin (LIPITOR) 20 MG tablet Take 20 mg by mouth daily.   10/30/2019 at Unknown time   clopidogrel (PLAVIX) 75 MG tablet Take 1 tablet (75 mg total) by mouth daily.   10/18/2019 at Unknown time   finasteride (PROSCAR) 5 MG tablet Take 1 tablet (5 mg total) by mouth daily.   10/27/2019 at Unknown time   metoprolol succinate (TOPROL-XL) 50 MG 24 hr tablet Take 1 tablet (50 mg total) by mouth daily. Take with or immediately following a meal.   10/27/2019 at 0800   Vitamin D3 (VITAMIN D) 25 MCG tablet Take 1 tablet (1,000 Units total) by mouth daily. 30 tablet 0 10/15/2019 at Unknown time   zinc sulfate 220 (50  Zn) MG capsule Take 1 capsule (220 mg total) by mouth daily. 30 capsule 0 10/27/2019 at Unknown time   albuterol (VENTOLIN HFA) 108 (90 Base) MCG/ACT inhaler Inhale 2 puffs into the lungs every 6 (six) hours as needed for wheezing or shortness of breath. (Patient not taking: Reported on 11/03/2019) 8 g 2 Not Taking at Unknown time   benzonatate (TESSALON PERLES) 100 MG capsule Take 1 capsule (100 mg total) by mouth every 6 (six) hours as needed for cough. (Patient not taking: Reported on 11/03/2019) 30 capsule 0 Completed Course at Unknown time    Assessment: 11/05/2019 an 74 y.o.malewith past medical history of hypertension, CVA with residual right-sided weakness, CKD stage IIIa, obese, hyperlipidemia, recent COVID-19 infection (admitted 9/13-15, s/p 5 doses Remdesivir).  Pharmacy consulted to dose heparin for pulmonary embolism. Patient did receive a 5000 unit subcutaneous dose of heparin at 0639 this morning. Baseline aPTT 28, INR/PT 1.3/15.2 (from 9/21)  9/28 AM update:  Heparin level is elevated No issues per RN Drawn from same arm due to other arm being contracted but it was away from infusion site and RN held infusion for several minutes prior to the lab draw  Goal of Therapy:  Heparin level 0.3-0.7 units/ml Monitor platelets by anticoagulation protocol: Yes   Plan:  Hold heparin x 1 hr Re-start heparin at 1100 units/hr at 0130 0930 heparin level  Abran Duke, PharmD, BCPS Clinical Pharmacist Phone: [phone]

## 2019-11-09 NOTE — Consult Note (Addendum)
Referring Provider: Dr. Lowell GuitarPowell Primary Care Physician:  No primary care provider on file. Primary Gastroenterologist:  Gentry FitzUnassigned   Reason for Consultation:  Melena   HPI: Robert Buckley is a 74 y.o. male with a past medical history of arthritis, hypertension, hyperlipidemia, CVA with right sided  weakness in 2008 and CKD stage III. He was admitted to the hospital 9/12 - 10/27/2019 with respiratory failure secondary to Covid pneumonia.  He was treated with Remdesivir x 5 days and steroids x 10 days.   He presented [facility] on 11/01/2019 with right sided weakness with prior history of a CVA with residual right sided weakness but his right sided weakness was worse and he had a little confusion. Neuro was consulted, he underwent a head CT which was concerning for a thalamus hemorrhage. A brain MRI was negative, no evidence of an intracranial hemorrhage. His oxygen saturation was 90% and he was placed on oxygen 2L nasal canula.  A chest xray showed a possible pulmonary infarct so he was started on a Heparin infusion. A VQ scan showed low probability for PE.  He developed melenic stool x 1  this morning as reported by his nurse therefore his heparin infusion and Plavix were held. He was started on Pantoprazole IV  bid. A GI consult was requested for further evaluation for further GI evaluation.   Due to the patient's recent Covid pneumonia infection under isolation protocol a phone consult was attempted at this time. I spoke to the patient's RN. She stated the patient passed a brown BM this morning with dark red blood, no black stool. No further bloody BMs so far today. She stated the patient does not complain of having any heartburn, upper or lower abdominal pain. Today Hg 13.6 -> 14.0.  He is hemodynamically stable.   I spoke to the patient. He denies having any dysphagia, heartburn or upper abdominal pain. No lower abdominal pain. He typically passes a normal BM daily. No rectal bleeding or black  stools. No rectal pain. No history of GI bleeding. He took ASA in the past but not recently. He denies taking any NSAIDS. No alcohol use. No drug use. He is a nonsmoker.  He's never had a screening colonoscopy. He did not look at his BM today that was reported to have dark red blood on it. No family history of esophageal, gastric or colon cancer.    Past Medical History:  Diagnosis Date  . Arthritis   . Hypertension   . Stroke [facility](HCC) 2008   residual R sided weakness    History reviewed. No pertinent surgical history.  Prior to Admission medications   Medication Sig Start Date End Date Taking? Authorizing Provider  amLODipine (NORVASC) 10 MG tablet Take 10 mg by mouth daily. 10/24/19  Yes [provider]  ascorbic acid (VITAMIN C) 500 MG tablet Take 1 tablet (500 mg total) by mouth daily. 10/27/19 11/26/19 Yes Sreenath, Sudheer B, MD  atorvastatin (LIPITOR) 20 MG tablet Take 20 mg by mouth daily. 07/29/19  Yes [provider]  clopidogrel (PLAVIX) 75 MG tablet Take 1 tablet (75 mg total) by mouth daily. 10/16/16  Yes Angiulli, Mcarthur Rossettianiel J, PA-C  finasteride (PROSCAR) 5 MG tablet Take 1 tablet (5 mg total) by mouth daily. 10/16/16  Yes Angiulli, Mcarthur Rossettianiel J, PA-C  metoprolol succinate (TOPROL-XL) 50 MG 24 hr tablet Take 1 tablet (50 mg total) by mouth daily. Take with or immediately following a meal. 10/16/16  Yes Angiulli, Mcarthur Rossettianiel J, PA-C  Vitamin D3 (VITAMIN D) 25 MCG tablet Take 1 tablet (1,000 Units total) by mouth daily. 10/27/19 11/26/19 Yes Sreenath, Sudheer B, MD  zinc sulfate 220 (50 Zn) MG capsule Take 1 capsule (220 mg total) by mouth daily. 10/27/19 11/26/19 Yes Sreenath, Sudheer B, MD  albuterol (VENTOLIN HFA) 108 (90 Base) MCG/ACT inhaler Inhale 2 puffs into the lungs every 6 (six) hours as needed for wheezing or shortness of breath. Patient not taking: Reported on 11/03/2019 10/27/19   Tresa Moore, MD  benzonatate (TESSALON PERLES) 100 MG capsule Take 1 capsule (100 mg  total) by mouth every 6 (six) hours as needed for cough. Patient not taking: Reported on 11/03/2019 10/27/19 10/26/20  Tresa Moore, MD    Current Facility-Administered Medications  Medication Dose Route Frequency Provider Last Rate Last Admin  . acetaminophen (TYLENOL) tablet 650 mg  650 mg Oral Q4H PRN Bhagat, Srishti L, MD   650 mg at 11/07/19 2220   Or  . acetaminophen (TYLENOL) 160 MG/5ML solution 650 mg  650 mg Per Tube Q4H PRN Bhagat, Srishti L, MD       Or  . acetaminophen (TYLENOL) suppository 650 mg  650 mg Rectal Q4H PRN Bhagat, Srishti L, MD   650 mg at 11/03/19 0007  . albuterol (VENTOLIN HFA) 108 (90 Base) MCG/ACT inhaler 2 puff  2 puff Inhalation Q6H PRN Bhagat, Srishti L, MD      . amLODipine (NORVASC) tablet 10 mg  10 mg Oral Daily Zigmund Akiem Urieta., MD   10 mg at 11/09/19 [phone]  . ascorbic acid (VITAMIN C) tablet 500 mg  500 mg Oral Daily Pahwani, Rinka R, MD   500 mg at 11/09/19 0952  . atorvastatin (LIPITOR) tablet 20 mg  20 mg Oral q1800 Zigmund Champ Keetch., MD   20 mg at 11/08/19 1714  . baricitinib (OLUMIANT) tablet 2 mg  2 mg Oral Daily Zigmund Averey Trompeter., MD   2 mg at 11/09/19 [phone]  . chlorpheniramine-HYDROcodone (TUSSIONEX) 10-8 MG/5ML suspension 5 mL  5 mL Oral Q12H PRN Pahwani, Rinka R, MD   5 mL at 11/09/19 0951  . finasteride (PROSCAR) tablet 5 mg  5 mg Oral Daily Zigmund Meloni Hinz., MD   5 mg at 11/09/19 0953  . guaiFENesin-dextromethorphan (ROBITUSSIN DM) 100-10 MG/5ML syrup 10 mL  10 mL Oral Q4H PRN Pahwani, Rinka R, MD   10 mL at 11/08/19 2159  . insulin aspart (novoLOG) injection 0-15 Units  0-15 Units Subcutaneous TID WC Zigmund Aliece Honold., MD   3 Units at 11/09/19 1228  . insulin aspart (novoLOG) injection 0-5 Units  0-5 Units Subcutaneous QHS Zigmund Jacob Chamblee., MD   3 Units at 11/08/19 2154  . insulin aspart (novoLOG) injection 5 Units  5 Units Subcutaneous TID WC Zigmund Kareli Hossain., MD   5 Units at 11/09/19 1228  . insulin  detemir (LEVEMIR) injection 7 Units  7 Units Subcutaneous BID Zigmund Aralyn Nowak., MD   7 Units at 11/09/19 [phone]  . methylPREDNISolone sodium succinate (SOLU-MEDROL) 40 mg/mL injection 40 mg  40 mg Intravenous Q12H Zigmund Koen Antilla., MD   40 mg at 11/09/19 [phone]  . metoprolol tartrate (LOPRESSOR) tablet 12.5 mg  12.5 mg Oral BID Zigmund Maeci Kalbfleisch., MD   12.5 mg at 11/09/19 5643  . pantoprazole (PROTONIX) injection 40 mg  40 mg Intravenous Q12H Zigmund Sherae Santino., MD   40 mg at 11/09/19 1039  .  polyethylene glycol (MIRALAX / GLYCOLAX) packet 17 g  17 g Oral BID Zigmund Jarae Nemmers., MD   17 g at 11/08/19 2156  . zinc sulfate capsule 220 mg  220 mg Oral Daily Pahwani, Rinka R, MD   220 mg at 11/09/19 1610    Allergies as of 11/10/2019 - Review Complete 11/10/2019  Allergen Reaction Noted  . Sulfa antibiotics Rash 09/28/2016    Family History  Problem Relation Age of Onset  . Diabetes Mother     Social History   Socioeconomic History  . Marital status: Single    Spouse name: Not on file  . Number of children: Not on file  . Years of education: Not on file  . Highest education level: Not on file  Occupational History  . Occupation: Retired  Tobacco Use  . Smoking status: Never Smoker  . Smokeless tobacco: Never Used  Vaping Use  . Vaping Use: Never used  Substance and Sexual Activity  . Alcohol use: No  . Drug use: No  . Sexual activity: Never  Other Topics Concern  . Not on file  Social History Narrative  . Not on file   Social Determinants of Health   Financial Resource Strain:   . Difficulty of Paying Living Expenses: Not on file  Food Insecurity:   . Worried About Programme researcher, broadcasting/film/video in the Last Year: Not on file  . Ran Out of Food in the Last Year: Not on file  Transportation Needs:   . Lack of Transportation (Medical): Not on file  . Lack of Transportation (Non-Medical): Not on file  Physical Activity:   . Days of Exercise per Week: Not on file    . Minutes of Exercise per Session: Not on file  Stress:   . Feeling of Stress : Not on file  Social Connections:   . Frequency of Communication with Friends and Family: Not on file  . Frequency of Social Gatherings with Friends and Family: Not on file  . Attends Religious Services: Not on file  . Active Member of Clubs or Organizations: Not on file  . Attends Banker Meetings: Not on file  . Marital Status: Not on file  Intimate Partner Violence:   . Fear of Current or Ex-Partner: Not on file  . Emotionally Abused: Not on file  . Physically Abused: Not on file  . Sexually Abused: Not on file    Review of Systems: Gen: Denies fever, sweats or chills. Recently lost a few pounds.  CV: Denies chest pain, palpitations or edema. Resp: Denies cough, shortness of breath of hemoptysis.  GI: See HPI.   GU : Denies urinary burning, blood in urine, increased urinary frequency or incontinence. MS: Denies joint pain, muscles aches or weakness. Derm: Denies rash, itchiness, skin lesions or unhealing ulcers. Psych: Denies depression, anxiety or memory loss.  Heme: Denies easy bruising, bleeding. Neuro:  Hx of CVA with right sided weakness.  Endo:  Denies any problems with DM, thyroid or adrenal function.  Physical Exam: Vital signs in last 24 hours: Temp:  [97.7 F (36.5 C)-98.5 F (36.9 C)] 98.2 F (36.8 C) (09/28 1134) Pulse Rate:  [55-85] 62 (09/28 1134) Resp:  [18-20] 18 (09/28 1134) BP: (132-141)/(79-98) 134/87 (09/28 1134) SpO2:  [92 %-94 %] 93 % (09/28 1134) Last BM Date: 11/09/19    Intake/Output from previous day: 09/27 0701 - 09/28 0700 In: 2724 [P.O.:1342; I.V.:1382] Out: 2375 [Urine:2375] Intake/Output this shift: Total I/O In: 2748 [P.O.:2280;  I.V.:468] Out: -   Lab Results: Recent Labs    11/07/19 0723 11/07/19 0723 11/08/19 0302 11/09/19 0653 11/09/19 1115  WBC 12.8*  --  10.4 17.1*  --   HGB 14.8   < > 13.2 13.6 14.0  HCT 44.8   < > 38.6*  38.9* 40.8  PLT 154  --  162 190  --    < > = values in this interval not displayed.   BMET Recent Labs    11/07/19 0723 11/08/19 0302 11/09/19 0653  NA 141 140 139  K 4.9 4.5 5.0  CL 110 109 110  CO2 19* 22 23  GLUCOSE 163* 186* 201*  BUN 41* 44* 46*  CREATININE 1.62* 1.90* 1.74*  CALCIUM 8.0* 7.7* 7.7*   LFT Recent Labs    11/09/19 0653  PROT 5.0*  ALBUMIN 2.1*  AST 31  ALT 107*  ALKPHOS 58  BILITOT 0.5   PT/INR No results for input(s): LABPROT, INR in the last 72 hours. Hepatitis Panel No results for input(s): HEPBSAG, HCVAB, HEPAIGM, HEPBIGM in the last 72 hours.    Studies/Results: NM Pulmonary Perfusion  Result Date: 11/08/2019 CLINICAL DATA:  Hypoxia.  Positive D-dimer study. EXAM: NUCLEAR MEDICINE PERFUSION LUNG SCAN TECHNIQUE: Perfusion images were obtained in multiple projections after intravenous injection of radiopharmaceutical. Views: Anterior, posterior, left lateral, right lateral, RPO, LPO, RAO, LAO RADIOPHARMACEUTICALS:  4.4 mCi Tc-49m MAA IV COMPARISON:  Chest radiograph November 08, 2019 and CT angiogram chest November 04, 2019 FINDINGS: There is apparent photopenia throughout significant portions of each upper lobe. Elsewhere distribution of radiotracer uptake is normal. Note that there is airspace consolidation in a portion of the right upper lobe. CT shows evidence of bullous disease in portions of the left upper lobe. IMPRESSION: Areas of photopenia in each upper lobe region. Pneumonia in the right upper lobe. Bullous disease left upper lobe. It is possible that the areas of photopenia are due to these underlying factors. Radiotracer uptake is otherwise symmetric and unremarkable. The overall constellation of findings suggest a relatively low probability for pulmonary embolus. Pulmonary embolus is not excluded, however. If further assessment for pulmonary embolus is felt to be warranted, repeat chest CT angiogram would be a reasonable consideration.  Potentially bilateral lower extremity duplex venous ultrasound could be helpful as well in this circumstance. Electronically Signed   By: Bretta Bang III M.D.   On: 11/08/2019 15:07   DG CHEST PORT 1 VIEW  Result Date: 11/08/2019 CLINICAL DATA:  COVID.  Hypoxia. EXAM: PORTABLE CHEST 1 VIEW COMPARISON:  11/06/2019. FINDINGS: Mediastinum stable. Heart size stable. Progressive right upper lobe atelectasis. Wedge-shaped density noted in the right upper lobe. Pulmonary infarct cannot be excluded. Persistent bilateral interstitial infiltrates are again noted. No pleural effusion or pneumothorax. Left costophrenic angle incompletely imaged. No acute bony abnormality. IMPRESSION: 1. Progressive right upper lobe atelectasis. Wedge-shaped density noted in the right upper lobe. Pulmonary infarct cannot be excluded. 2.  Persistent bilateral interstitial infiltrates are again noted. Electronically Signed   By: Maisie Fus  Register   On: 11/08/2019 05:27    IMPRESSION/PLAN:  72. 74 year old male with a history of a CVA with right sided weakness on Plavix admitted to the hospital with worsening right sided weakness. Neuro evaluation was negative for CVA. Recently treated for Covid 19 pneumonia. Chest xray showed questionable PE therefore he was started on IV heparin. VQ scan was low probability. He passed a blood BM today, dark red blood on a brown reddish stool  reported by his day shift RN. No further blood BM today. Hg is stable. No upper or lower abdominal pain. He is hemodynamically stable.  -Ok to continue PPI IV bid for now. -Ok to restart Plavix. -Await LE doppler study results. If his doppler study is negative then anticoagulation will not be required as verified by Dr. Lowell Guitar.  -If the patient requires anticoagulation besides his chronic plavix and he demonstrates further active GI bleeding then will proceed with inpatient GI testing (colonoscopy +/- EGD) -If no further active GI bleeding, recommend  outpatient follow up in our  GI clinic to schedule an EGD/colonoscopy after he recovers completely from his Covid infection.  Further recommendations per Dr. Hassell Halim Roxanna Mew  11/09/2019, 3:52 PM   ________________________________________________________________________  Corinda [facility] MD note:  I did not go in his room because he has Covid however I spoke with Jill Side, reviewed the data and agree with the assessment and plan described above.  Not really sure if he has had a PE and so not really sure if he needs blood thinners beyond his chronic daily plavix. Will await determination about his blood thinning needs to triage inpatient GI testing vs. Outpatient GI testing.    Rob Bunting, MD [facility] Pager [phone]

## 2019-11-09 NOTE — Progress Notes (Signed)
Bilateral lower extremity venous duplex has been completed. Preliminary results can be found in CV Proc through chart review.   11/09/19 5:15 PM Olen Cordial RVT

## 2019-11-09 NOTE — Progress Notes (Signed)
Physical Therapy Treatment Patient Details Name: Robert Buckley MRN: [id number] DOB: [birth year] Today's Date: 11/09/2019    History of Present Illness 73yoM admitted with acute hypoxemic respiratory failure due to COVID-19 pneumonia pt with Acute metabolic encephalopathy PMH: CVA c Rt weakness and RW use. Noted (+) Covid test,    PT Comments    Patient eager to get OOB and following simple commands. No episodes of knee buckling with sit to stand or with pivotal steps to his left. Required 2 person moderate assist for standing (x 3 reps) and transfer. Noted CIR admissions coordinator signed off due to patient has to be off airborne precautions (possibly 10/3) or have 2 negative COVID tests. Continue to feel patient could benefit from CIR if he is hospitalized long enough to qualify.     Follow Up Recommendations  CIR (noted not eligible until 10/3 due to airborne precautions)     Equipment Recommendations  None recommended by PT    Recommendations for Other Services       Precautions / Restrictions Precautions Precautions: Fall    Mobility  Bed Mobility Overal bed mobility: Needs Assistance Bed Mobility: Rolling;Supine to Sit Rolling: Supervision   Supine to sit: Mod assist;HOB elevated     General bed mobility comments: pt requires (A) to elevate trunk from surface and help R LE off EOB  Transfers Overall transfer level: Needs assistance Equipment used: 2 person hand held assist Transfers: Sit to/from [employer] Sit to Stand: +2 physical assistance;Mod assist Stand pivot transfers: +2 physical assistance;Mod assist       General transfer comment: Pt able to complete 3 sit<>stand transfers with mod A (no RLE buckling observed) pt able to demonstrate increased ability to pivot with cues to L (mod of 2 for safety)  Ambulation/Gait Ambulation/Gait assistance: Mod assist;+2 physical assistance Gait Distance (Feet): 2 Feet Assistive device: 2 person  hand held assist Gait Pattern/deviations: Step-to pattern;Decreased stride length;Shuffle     General Gait Details: pivotal steps to chair on his left   Stairs             Wheelchair Mobility    Modified Rankin (Stroke Patients Only)       Balance Overall balance assessment: Needs assistance Sitting-balance support: Single extremity supported;Feet supported Sitting balance-Leahy Scale: Poor Sitting balance - Comments: Reliant on at least 1 UE support    Standing balance support: During functional activity;Bilateral upper extremity supported Standing balance-Leahy Scale: Poor Standing balance comment: reliant on UE support from therapists, minimal posterior lean but could correct                            Cognition Arousal/Alertness: Awake/alert Behavior During Therapy: WFL for tasks assessed/performed Overall Cognitive Status: History of cognitive impairments - at baseline                                 General Comments: Pt will repeat self and often state "thats about all I can do" and then attempt to re-engage in the task. Minimally impulsive (more so motivated to get OOB) but easily redirected      Exercises      General Comments General comments (skin integrity, edema, etc.): on 9L HFNC with sats >90% throughout session. No incr WOB noted      Pertinent Vitals/Pain Pain Assessment: No/denies pain    Home Living  Prior Function            PT Goals (current goals can now be found in the care plan section) Acute Rehab PT Goals Patient Stated Goal: to go home Time For Goal Achievement: 11/19/19 Potential to Achieve Goals: Fair Progress towards PT goals: Progressing toward goals    Frequency    Min 3X/week      PT Plan Current plan remains appropriate    Co-evaluation PT/OT/SLP Co-Evaluation/Treatment: Yes Reason for Co-Treatment: Complexity of the patient's impairments (multi-system  involvement);For patient/therapist safety;To address functional/ADL transfers PT goals addressed during session: Mobility/safety with mobility;Balance;Strengthening/ROM OT goals addressed during session: ADL's and self-care;Strengthening/ROM      AM-PAC PT "6 Clicks" Mobility   Outcome Measure  Help needed turning from your back to your side while in a flat bed without using bedrails?: A Lot Help needed moving from lying on your back to sitting on the side of a flat bed without using bedrails?: A Lot Help needed moving to and from a bed to a chair (including a wheelchair)?: A Lot Help needed standing up from a chair using your arms (e.g., wheelchair or bedside chair)?: A Lot Help needed to walk in hospital room?: A Lot Help needed climbing 3-5 steps with a railing? : Total 6 Click Score: 11    End of Session Equipment Utilized During Treatment: Gait belt Activity Tolerance: Patient tolerated treatment well Patient left: with call bell/phone within reach;in chair;with chair alarm set Nurse Communication: Mobility status PT Visit Diagnosis: Difficulty in walking, not elsewhere classified (R26.2);Muscle weakness (generalized) (M62.81);Repeated falls (R29.6)     Time: 0932-6712 PT Time Calculation (min) (ACUTE ONLY): 32 min  Charges:  $Therapeutic Activity: 8-22 mins                      Jerolyn Center, PT Pager [phone]    Zena Amos 11/09/2019, 4:56 PM

## 2019-11-09 NOTE — Progress Notes (Signed)
Noted  pt was desaturating in mid 80 s Went into pt room and found out that pt  pulled out his [place] .  Oxygen saturation was still low Assisted with repositioning encouraged  deep breathing exercise  and use  the incentive spirometer but pt  refused  Education given Pt took couple of deep breaths and O2 saturation  Increased to 94 with HFNC 10 L./ min   Pt not found  in any acute distress.  Will continue to monitor.

## 2019-11-09 NOTE — Progress Notes (Signed)
ANTICOAGULATION CONSULT NOTE   Pharmacy Consult for Heparin Indication: pulmonary embolus  Allergies  Allergen Reactions  . Sulfa Antibiotics Rash    Patient Measurements: Height: 5\' 7"  (170.2 cm) Weight: 79 kg (174 lb 2.6 oz) IBW/kg (Calculated) : 66.1 Heparin Dosing Weight: 79 kg  Vital Signs: Temp: 97.7 F (36.5 C) (09/28 1033) Temp Source: Oral (09/28 1033) BP: 140/96 (09/28 1033) Pulse Rate: 70 (09/28 1033)  Labs: Recent Labs    11/07/19 0723 11/07/19 0723 11/08/19 0302 11/08/19 2157 11/09/19 0653 11/09/19 0718  HGB 14.8   < > 13.2  --  13.6  --   HCT 44.8  --  38.6*  --  38.9*  --   PLT 154  --  162  --  190  --   HEPARINUNFRC  --   --   --  1.94*  --  1.80*  CREATININE 1.62*  --  1.90*  --  1.74*  --    < > = values in this interval not displayed.   Estimated Creatinine Clearance: 35.4 mL/min (A) (by C-G formula based on SCr of 1.74 mg/dL (H)).  Medical History: Past Medical History:  Diagnosis Date  . Arthritis   . Hypertension   . Stroke [facility]) 2008   residual R sided weakness    Medications:  Medications Prior to Admission  Medication Sig Dispense Refill Last Dose  . amLODipine (NORVASC) 10 MG tablet Take 10 mg by mouth daily.   10/28/2019 at Unknown time  . ascorbic acid (VITAMIN C) 500 MG tablet Take 1 tablet (500 mg total) by mouth daily. 30 tablet 0 10/14/2019 at Unknown time  . atorvastatin (LIPITOR) 20 MG tablet Take 20 mg by mouth daily.   10/23/2019 at Unknown time  . clopidogrel (PLAVIX) 75 MG tablet Take 1 tablet (75 mg total) by mouth daily.   10/28/2019 at Unknown time  . finasteride (PROSCAR) 5 MG tablet Take 1 tablet (5 mg total) by mouth daily.   10/26/2019 at Unknown time  . metoprolol succinate (TOPROL-XL) 50 MG 24 hr tablet Take 1 tablet (50 mg total) by mouth daily. Take with or immediately following a meal.   10/30/2019 at 0800  . Vitamin D3 (VITAMIN D) 25 MCG tablet Take 1 tablet (1,000 Units total) by mouth daily. 30 tablet 0  10/16/2019 at Unknown time  . zinc sulfate 220 (50 Zn) MG capsule Take 1 capsule (220 mg total) by mouth daily. 30 capsule 0 11/04/2019 at Unknown time  . albuterol (VENTOLIN HFA) 108 (90 Base) MCG/ACT inhaler Inhale 2 puffs into the lungs every 6 (six) hours as needed for wheezing or shortness of breath. (Patient not taking: Reported on 11/03/2019) 8 g 2 Not Taking at Unknown time  . benzonatate (TESSALON PERLES) 100 MG capsule Take 1 capsule (100 mg total) by mouth every 6 (six) hours as needed for cough. (Patient not taking: Reported on 11/03/2019) 30 capsule 0 Completed Course at Unknown time    Assessment: 11/05/2019 an 74 y.o.malewith past medical history of hypertension, CVA with residual right-sided weakness, CKD stage IIIa, obese, hyperlipidemia, recent COVID-19 infection (admitted 9/13-15, s/p 5 doses Remdesivir).  Pharmacy consulted to dose heparin for pulmonary embolism. . Baseline aPTT 28, INR/PT 1.3/15.2 (from 9/21)  9/28 AM update:  Heparin level is still elevated at 1.74 Patient now reported to have bleeding in stool per RN. MD contacted and heparin on hold for now.    Goal of Therapy:  Heparin level 0.3-0.7 units/ml Monitor platelets by  anticoagulation protocol: Yes   Plan:  Hold heparin per MD F/u restart  Lovelace Cerveny A. Jeanella Craze, PharmD, BCPS, FNKF Clinical Pharmacist [place] Please utilize Amion for appropriate phone number to reach the unit pharmacist [facility])

## 2019-11-09 NOTE — Progress Notes (Signed)
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 Noted (+) Covid test,   OT comments  Patient continues to make steady progress towards goals in skilled OT session. Patient's session encompassed functional transfers, bed mobility, and ADLs in order to increase overall activity tolerance and endurance. Pt with increased ability to manipulate RUE, (able to composite flex and extend with cues) however continues to require increased assist to maneuver RLE to complete bed mobility and transfers. Pt easily distracted and perseverative on getting out of bed or going home but can redirect with min cues. Discharge to CIR remains appropriate, as pt is motivated and able to tolerate increased therapy intensity.    Follow Up Recommendations  CIR    Equipment Recommendations  None recommended by OT    Recommendations for Other Services      Precautions / Restrictions Precautions Precautions: Fall       Mobility Bed Mobility Overal bed mobility: Needs Assistance Bed Mobility: Rolling;Supine to Sit Rolling: Supervision   Supine to sit: Mod assist;HOB elevated     General bed mobility comments: pt requires (A) to elevate trunk from surface and help R LE off EOB  Transfers Overall transfer level: Needs assistance Equipment used: 2 person hand held assist Transfers: Sit to/from [employer] Sit to Stand: +2 physical assistance;Mod assist Stand pivot transfers: +2 physical assistance;Mod assist       General transfer comment: Pt able to complete 3 sit<>stand transfers with mod A (no RLE buckling observed) pt able to demonstrate increased ability to pivot with cues to L (mod of 2 for safety)    Balance Overall  balance assessment: Needs assistance Sitting-balance support: Single extremity supported;Feet supported Sitting balance-Leahy Scale: Poor Sitting balance - Comments: Reliant on at least 1 UE support    Standing balance support: During functional activity;Bilateral upper extremity supported Standing balance-Leahy Scale: Poor Standing balance comment: reliant on UE support from therapists, minimal posterior lean but could correct                           ADL either performed or assessed with clinical judgement   ADL Overall ADL's : Needs assistance/impaired Eating/Feeding: Set up;Sitting Eating/Feeding Details (indicate cue type and reason): L hand required Grooming: Wash/dry hands;Wash/dry face;Sitting;Set up                               Functional mobility during ADLs: Moderate assistance;Cueing for safety;Cueing for sequencing General ADL Comments: pt requires cue to complete tasks appopriately, but is demonstrating increased progress especially when WB through RLE to stand pivot to recliner     Vision       Perception     Praxis      Cognition Arousal/Alertness: Awake/alert Behavior During Therapy: WFL for tasks assessed/performed Overall Cognitive Status: History of cognitive impairments - at baseline                                 General Comments: Pt will repeat self and often state "thats about all I can do" and then attempt to re-engage in the task. Minimally impulsive (moreso motivated to  get OOB) but easily redirected        Exercises     Shoulder Instructions       General Comments      Pertinent Vitals/ Pain       Pain Assessment: No/denies pain  Home Living                                          Prior Functioning/Environment              Frequency  Min 2X/week        Progress Toward Goals  OT Goals(current goals can now be found in the care plan section)  Progress towards OT  goals: Progressing toward goals  Acute Rehab OT Goals Patient Stated Goal: to go home OT Goal Formulation: With patient Time For Goal Achievement: 11/19/19 Potential to Achieve Goals: Good  Plan Discharge plan remains appropriate    Co-evaluation    PT/OT/SLP Co-Evaluation/Treatment: Yes Reason for Co-Treatment: To address functional/ADL transfers;For patient/therapist safety PT goals addressed during session: Mobility/safety with mobility;Balance OT goals addressed during session: ADL's and self-care;Strengthening/ROM      AM-PAC OT "6 Clicks" Daily Activity     Outcome Measure   Help from another person eating meals?: A Little Help from another person taking care of personal grooming?: A Little Help from another person toileting, which includes using toliet, bedpan, or urinal?: A Lot Help from another person bathing (including washing, rinsing, drying)?: A Lot Help from another person to put on and taking off regular upper body clothing?: A Lot Help from another person to put on and taking off regular lower body clothing?: A Lot 6 Click Score: 14    End of Session Equipment Utilized During Treatment: Oxygen  OT Visit Diagnosis: Unsteadiness on feet (R26.81);Muscle weakness (generalized) (M62.81);Hemiplegia and hemiparesis Hemiplegia - Right/Left: Right Hemiplegia - dominant/non-dominant: Dominant Hemiplegia - caused by: Cerebral infarction   Activity Tolerance Patient tolerated treatment well   Patient Left in chair;with call bell/phone within reach;with chair alarm set   Nurse Communication Mobility status;Precautions        Time: 4665-9935 OT Time Calculation (min): 30 min  Charges: OT General Charges $OT Visit: 1 Visit OT Treatments $Self Care/Home Management : 8-22 mins  Pollyann Glen E. Pal Shell, COTA/L Acute Rehabilitation Services [phone]   Cherlyn Cushing 11/09/2019, 2:24 PM

## 2019-11-10 ENCOUNTER — Telehealth: Payer: Self-pay

## 2019-11-10 ENCOUNTER — Inpatient Hospital Stay (HOSPITAL_COMMUNITY): Payer: Medicare HMO

## 2019-11-10 LAB — COMPREHENSIVE METABOLIC PANEL
ALT: 90 U/L — ABNORMAL HIGH (ref 0–44)
AST: 21 U/L (ref 15–41)
Albumin: 2.3 g/dL — ABNORMAL LOW (ref 3.5–5.0)
Alkaline Phosphatase: 62 U/L (ref 38–126)
Anion gap: 8 (ref 5–15)
BUN: 40 mg/dL — ABNORMAL HIGH (ref 8–23)
CO2: 23 mmol/L (ref 22–32)
Calcium: 7.9 mg/dL — ABNORMAL LOW (ref 8.9–10.3)
Chloride: 108 mmol/L (ref 98–111)
Creatinine, Ser: 1.81 mg/dL — ABNORMAL HIGH (ref 0.61–1.24)
GFR calc Af Amer: 42 mL/min — ABNORMAL LOW (ref >60)
GFR calc non Af Amer: 36 mL/min — ABNORMAL LOW (ref >60)
Glucose, Bld: 153 mg/dL — ABNORMAL HIGH (ref 70–99)
Potassium: 5.1 mmol/L (ref 3.5–5.1)
Sodium: 139 mmol/L (ref 135–145)
Total Bilirubin: 0.9 mg/dL (ref 0.3–1.2)
Total Protein: 5.3 g/dL — ABNORMAL LOW (ref 6.5–8.1)

## 2019-11-10 LAB — CBC WITH DIFFERENTIAL/PLATELET
Abs Immature Granulocytes: 0.32 10*3/uL — ABNORMAL HIGH (ref 0.00–0.07)
Basophils Absolute: 0 10*3/uL (ref 0.0–0.1)
Basophils Relative: 0 %
Eosinophils Absolute: 0 10*3/uL (ref 0.0–0.5)
Eosinophils Relative: 0 %
HCT: 39.8 % (ref 39.0–52.0)
Hemoglobin: 13.6 g/dL (ref 13.0–17.0)
Immature Granulocytes: 2 %
Lymphocytes Relative: 4 %
Lymphs Abs: 0.6 10*3/uL — ABNORMAL LOW (ref 0.7–4.0)
MCH: 30.7 pg (ref 26.0–34.0)
MCHC: 34.2 g/dL (ref 30.0–36.0)
MCV: 89.8 fL (ref 80.0–100.0)
Monocytes Absolute: 0.4 10*3/uL (ref 0.1–1.0)
Monocytes Relative: 3 %
Neutro Abs: 13.1 10*3/uL — ABNORMAL HIGH (ref 1.7–7.7)
Neutrophils Relative %: 91 %
Platelets: 192 10*3/uL (ref 150–400)
RBC: 4.43 MIL/uL (ref 4.22–5.81)
RDW: 14 % (ref 11.5–15.5)
WBC: 14.5 10*3/uL — ABNORMAL HIGH (ref 4.0–10.5)
nRBC: 0 % (ref 0.0–0.2)

## 2019-11-10 LAB — D-DIMER, QUANTITATIVE: D-Dimer, Quant: 0.75 ug/mL-FEU — ABNORMAL HIGH (ref 0.00–0.50)

## 2019-11-10 LAB — MAGNESIUM: Magnesium: 2.3 mg/dL (ref 1.7–2.4)

## 2019-11-10 LAB — GLUCOSE, CAPILLARY
Glucose-Capillary: 153 mg/dL — ABNORMAL HIGH (ref 70–99)
Glucose-Capillary: 233 mg/dL — ABNORMAL HIGH (ref 70–99)
Glucose-Capillary: 163 mg/dL — ABNORMAL HIGH (ref 70–99)
Glucose-Capillary: 108 mg/dL — ABNORMAL HIGH (ref 70–99)

## 2019-11-10 LAB — C-REACTIVE PROTEIN: CRP: <0.5 mg/dL (ref <1.0)

## 2019-11-10 LAB — PROCALCITONIN: Procalcitonin: <0.1 ng/mL

## 2019-11-10 MED ORDER — SODIUM POLYSTYRENE SULFONATE 15 GM/60ML PO SUSP
15.0000 g | Freq: Once | ORAL | Status: AC
  Administered 2019-11-10: 15 g via ORAL
  Filled 2019-11-10: qty 60

## 2019-11-10 MED ORDER — MAGNESIUM HYDROXIDE 400 MG/5ML PO SUSP
30.0000 mL | Freq: Once | ORAL | Status: AC
  Administered 2019-11-10: 30 mL via ORAL
  Filled 2019-11-10: qty 30

## 2019-11-10 MED ORDER — HEPARIN SODIUM (PORCINE) 5000 UNIT/ML IJ SOLN
5000.0000 [IU] | Freq: Three times a day (TID) | INTRAMUSCULAR | Status: DC
  Administered 2019-11-10 – 2019-11-13 (×11): 5000 [IU] via SUBCUTANEOUS
  Filled 2019-11-10 (×11): qty 1

## 2019-11-10 NOTE — Progress Notes (Signed)
PROGRESS NOTE    Robert Buckley  EXB:[id number] DOB: [birth year] DOA: 2019-11-08 PCP: No primary care provider on file.   Chief Complaint  Patient presents with  . Code Stroke    Brief Narrative:  Robert Buckley is an 74 y.o. male with past medical history of hypertension, CVA with residual right-sided weakness, CKD stage IIIa, obese, hyperlipidemia, recent COVID-19 infection presents to emergency department with right arm weakness.   Patient presented to ER with confusion and worsening right-sided weakness at 3 PM.  EMS noted patient's oxygen saturation 90% was placed on 2 L of oxygen via nasal cannula.  Code stroke was activated and patient was evaluated by neurology.  Initial CT concerning for thalamus hemorrhage however MRI brain came back negative shows findings most consistent with small focus of mineralization/calcification.  No intracranial hemorrhage.  MRA head and neck came back negative for acute findings.  Initially his confusion was thought to be related to ICH.   He denies alcohol, smoking, illicit drug use.  Of note: Patient admitted on 9/13 with acute hypoxemic respiratory failure secondary to COVID-19 pneumonia and discharged home in stable condition on 9/15.  He received 3 out of 5 doses of remdesivir while he was hospitalized and received 2 remaining doses outpatient.  He discharged home on 10 days of steroids.  He was admitted with encephalopathy and right sided weakness and concern for acute stroke.  CT initially with concern for parenchymal hemorrhage and patient admitted to neurology service.  MRI showed no acute intracranial hemorrhage and no acute intracranial abnormality.  Sx thought from recrudescence of prior stroke.  He's now being treated for COVID 19 infection with steroids and baricitinib.  Also on abx.  He's gradually improved.  Hospitalization c/b CXR with possible pulm infarct, placed on anticoagulation.  VQ scan low prob, LE [place] pending.  Concern for blood  in stool 9/28, heparin stopped, GI consult.     Subjective:  Patient in bed, appears comfortable, denies any headache, no fever, no chest pain or pressure, no shortness of breath , no abdominal pain. No focal weakness.    Assessment & Plan:   Acute metabolic encephalopathy  Recrudescence of Chronic L Thalamic Infarct: Previous history of CVA with chronic right-sided weakness, MRI brain along with MRA brain and neck are nonacute, mental status back to normal, continue supportive care along with Plavix and statin for secondary prevention.  PT OT and speech eval.   COVID 19 Pneumonia  Possible Bacterial Pneumonia  Fever:  Recently treated for covid with steroids and remdesivir (2 days completed outpatient) -has been placed on combination of steroids and Baricitinib, has completed remdesivir treatment, also completed empiric antibiotic treatment.  Clinically improving.  Continue to advance activity and titrate down oxygen.    Recent Labs  Lab 11/04/19 1335 11/04/19 1550 11/05/19 0301 11/05/19 1807 11/06/19 0353 11/07/19 0723 11/08/19 0302 11/09/19 0653 11/10/19 0616  WBC 19.7*   < >  --    < > 14.1* 12.8* 10.4 17.1* 14.5*  CRP 15.9*   < > 10.7*  --  5.4* 2.2* 1.1* 0.7 <0.5  DDIMER 1.50*   < > 1.40*  --  1.03* 1.02* 1.13* 0.86* 0.75*  BNP  --   --   --   --  277.3*  --   --   --   --   PROCALCITON 0.78  --  0.72  --  0.42  --   --   --   --   AST  --    < >  42*  --  76* 47* 40 31 21  ALT  --    < > 51*  --  108* 115* 106* 107* 90*  ALKPHOS  --    < > 71  --  76 68 59 58 62  BILITOT  --    < > 0.7  --  0.5 0.9 0.7 0.5 0.9  ALBUMIN  --    < > 2.1*  --  2.2* 2.2* 2.0* 2.1* 2.3*   < > = values in this interval not displayed.       SpO2: 92 % O2 Flow Rate (L/min): 9 L/min   Elevated D dimer  Possible Pulm Infarct: Leg ultrasound negative, VQ scan low probability for PE.  D-dimer trending down, likely elevated due to inflammation from COVID-19 infection, continue prophylactic  dose heparin and monitor.  Outpatient follow-up with pulmonary in 1 to 2 weeks post discharge.  Will defer this to PCP.    Concern for blood in stool: No drop in H&H, no repeated episodes, this likely was due to constipation, monitor with bowel regimen and PPI twice daily..    AKI on CKD stage IIIb  NAGMA: -Creatinine: 2.07, GFR: 36 (baseline creatinine: 1.62, GFR: 48), imroved after hydration with IV fluids.  Elevated BNP  Elevated Troponin:  BNP significantly elevated, ? Related to CKD/AKI, Elevated troponin, mild, suspect 2/2 demand in setting of Hypoxia.  TTE Non acute without any WMA, EF 60%.   Leukocytosis: Likely secondary to recent steroid use vs possible bacterial infection.  History of stroke with right-sided residual weakness: Can resume home p.o. meds-statin, plavix  Hypertension: Stable, -Continue home meds amlodipine and metoprolol  Sinus Tachycardia: - improved  BPH: Continue Proscar  Thrombocytopenia: -improved.   Prediabetes  Steroid Induced Hyperglycemia A1c 6.2, basal/bolus, adjust as needed  CBG (last 3)  Recent Labs    11/09/19 1629 11/09/19 2140 11/10/19 0636  GLUCAP 223* 212* 153*       DVT prophylaxis: heparin Code Status: full  Family Communication:   Dayna Ramus [phone] - 11/10/19 at 10:12 AM, message left  son Francee Piccolo on [phone] on 11/10/2019 at 10:11 AM, message left.   Disposition:   Status is: Inpatient  Remains inpatient appropriate because:Inpatient level of care appropriate due to severity of illness   Dispo: The patient is from: Home              Anticipated d/c is to: pending              Anticipated d/c date is: > 3 days              Patient currently is not medically stable to d/c.   Consultants:   neurology  Procedures:   VQ - low probability  CTA -   1. No central or segmental pulmonary embolus. Unable to evaluate at the subsegmental level. 2. Right lung interstitial opacities may be partially due  to the patient's right lateral decubitus positioning; however, overlying infection cannot be excluded. Left hilar lymph node likely reactive. 3. 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  Leg [place] - No DVT   Echo  1. Left ventricular ejection fraction, by estimation, is 60 to 65%. The left ventricle has normal function. The left ventricle has no regional wall motion abnormalities. Left ventricular diastolic parameters are consistent with Grade I diastolic  dysfunction (impaired relaxation).  2. Right ventricular systolic function was not well visualized. The right ventricular size is not well visualized.  3.  The mitral valve was not well visualized. No evidence of mitral valve regurgitation.  4. The aortic valve was not well visualized. Aortic valve regurgitation is not visualized.    Antimicrobials:  Anti-infectives (From admission, onward)   Start     Dose/Rate Route Frequency Ordered Stop   11/04/19 1600  cefTRIAXone (ROCEPHIN) 2 g in sodium chloride 0.9 % 100 mL IVPB        2 g 200 mL/hr over 30 Minutes Intravenous Every 24 hours 11/04/19 1445 11/09/19 0600   11/04/19 1600  azithromycin (ZITHROMAX) tablet 500 mg        500 mg Oral Daily 11/04/19 1445 11/08/19 0942   11/21/2019 1830  cefTRIAXone (ROCEPHIN) 1 g in sodium chloride 0.9 % 100 mL IVPB        1 g 200 mL/hr over 30 Minutes Intravenous  Once Nov 21, 2019 1821 2019/11/21 1937   2019/11/21 1830  azithromycin (ZITHROMAX) 500 mg in sodium chloride 0.9 % 250 mL IVPB        500 mg 250 mL/hr over 60 Minutes Intravenous  Once 11/21/2019 1821 11/21/2019 2221         Objective: Vitals:   11/09/19 2300 11/10/19 0031 11/10/19 0300 11/10/19 0340  BP:  (!) 145/84  139/86  Pulse: (!) 55 66 68 71  Resp: 17 17 16 18   Temp:  97.7 F (36.5 C)  98 F (36.7 C)  TempSrc:  Oral  Oral  SpO2: 99% 100% 93% 91%  Weight:      Height:        Intake/Output Summary (Last 24 hours) at 11/10/2019 1007 Last data filed at 11/10/2019  0416 Gross per 24 hour  Intake 644.83 ml  Output 2400 ml  Net -1755.17 ml   Filed Weights   11/04/19 0305  Weight: 79 kg    Examination:  Awake Alert, chronic right-sided weakness from previous stroke, no new F.N deficits, Normal affect [place].AT,PERRAL Supple Neck,No JVD, No cervical lymphadenopathy appriciated.  Symmetrical Chest wall movement, Good air movement bilaterally, CTAB RRR,No Gallops, Rubs or new Murmurs, No Parasternal Heave +ve B.Sounds, Abd Soft, No tenderness, No organomegaly appriciated, No rebound - guarding or rigidity. No Cyanosis, Clubbing or edema, No new Rash or bruise   Data Reviewed: I have personally reviewed following labs and imaging studies  CBC: Recent Labs  Lab 11/06/19 0353 11/06/19 0353 11/07/19 0723 11/08/19 0302 11/09/19 0653 11/09/19 1115 11/10/19 0616  WBC 14.1*  --  12.8* 10.4 17.1*  --  14.5*  NEUTROABS 13.2*  --  11.7* 9.6* 15.8*  --  13.1*  HGB 14.1   < > 14.8 13.2 13.6 14.0 13.6  HCT 41.8   < > 44.8 38.6* 38.9* 40.8 39.8  MCV 91.1  --  94.1 89.8 90.9  --  89.8  PLT 165  --  154 162 190  --  192   < > = values in this interval not displayed.    Basic Metabolic Panel: Recent Labs  Lab 11/05/19 0301 11/05/19 0301 11/06/19 0353 11/07/19 0723 11/08/19 0302 11/09/19 0653 11/10/19 0616  NA 145   < > 141 141 140 139 139  K 3.8   < > 4.1 4.9 4.5 5.0 5.1  CL 115*   < > 112* 110 109 110 108  CO2 20*   < > 20* 19* 22 23 23   GLUCOSE 253*   < > 295* 163* 186* 201* 153*  BUN 38*   < > 40* 41* 44* 46* 40*  CREATININE  1.88*   < > 1.82* 1.62* 1.90* 1.74* 1.81*  CALCIUM 8.2*   < > 8.1* 8.0* 7.7* 7.7* 7.9*  MG 2.5*   < > 2.4 2.5* 2.3 2.3 2.3  PHOS 3.2  --  3.2 3.8 3.3 3.7  --    < > = values in this interval not displayed.    GFR: Estimated Creatinine Clearance: 34 mL/min (A) (by C-G formula based on SCr of 1.81 mg/dL (H)).  Liver Function Tests: Recent Labs  Lab 11/06/19 0353 11/07/19 0723 11/08/19 0302 11/09/19 0653  11/10/19 0616  AST 76* 47* 40 31 21  ALT 108* 115* 106* 107* 90*  ALKPHOS 76 68 59 58 62  BILITOT 0.5 0.9 0.7 0.5 0.9  PROT 5.8* 5.9* 5.3* 5.0* 5.3*  ALBUMIN 2.2* 2.2* 2.0* 2.1* 2.3*    CBG: Recent Labs  Lab 11/09/19 0620 11/09/19 1132 11/09/19 1629 11/09/19 2140 11/10/19 0636  GLUCAP 187* 188* 223* 212* 153*     Recent Results (from the past 240 hour(s))  Culture, blood (routine x 2)     Status: None   Collection Time: 01-Dec-2019  6:52 PM   Specimen: BLOOD LEFT ARM  Result Value Ref Range Status   Specimen Description BLOOD LEFT ARM  Final   Special Requests   Final    BOTTLES DRAWN AEROBIC AND ANAEROBIC Blood Culture adequate volume   Culture   Final    NO GROWTH 5 DAYS Performed at [facility], [address], [place], [place] [address]    Report Status 11/07/2019 FINAL  Final  Culture, blood (routine x 2)     Status: None   Collection Time: 2019-12-01  6:53 PM   Specimen: BLOOD  Result Value Ref Range Status   Specimen Description BLOOD LEFT ANTECUBITAL  Final   Special Requests   Final    BOTTLES DRAWN AEROBIC AND ANAEROBIC Blood Culture adequate volume   Culture   Final    NO GROWTH 5 DAYS Performed at [facility], [address] [place], [place] [address]    Report Status 11/07/2019 FINAL  Final  MRSA PCR Screening     Status: None   Collection Time: 11/04/19  7:05 PM   Specimen: Nasopharyngeal  Result Value Ref Range Status   MRSA by PCR NEGATIVE NEGATIVE Final    Comment:        The GeneXpert MRSA Assay (FDA approved for NASAL specimens only), is one component of a comprehensive MRSA colonization surveillance program. It is not intended to diagnose MRSA infection nor to guide or monitor treatment for MRSA infections. Performed at [facility], [address], [place], [place] [address]          Radiology Studies: NM Pulmonary Perfusion  Result Date: 11/08/2019 CLINICAL DATA:  Hypoxia.  Positive D-dimer study. EXAM:  NUCLEAR MEDICINE PERFUSION LUNG SCAN TECHNIQUE: Perfusion images were obtained in multiple projections after intravenous injection of radiopharmaceutical. Views: Anterior, posterior, left lateral, right lateral, RPO, LPO, RAO, LAO RADIOPHARMACEUTICALS:  4.4 mCi Tc-49m MAA IV COMPARISON:  Chest radiograph November 08, 2019 and CT angiogram chest November 04, 2019 FINDINGS: There is apparent photopenia throughout significant portions of each upper lobe. Elsewhere distribution of radiotracer uptake is normal. Note that there is airspace consolidation in a portion of the right upper lobe. CT shows evidence of bullous disease in portions of the left upper lobe. IMPRESSION: Areas of photopenia in each upper lobe region. Pneumonia in the right upper lobe. Bullous  disease left upper lobe. It is possible that the areas of photopenia are due to these underlying factors. Radiotracer uptake is otherwise symmetric and unremarkable. The overall constellation of findings suggest a relatively low probability for pulmonary embolus. Pulmonary embolus is not excluded, however. If further assessment for pulmonary embolus is felt to be warranted, repeat chest CT angiogram would be a reasonable consideration. Potentially bilateral lower extremity duplex venous ultrasound could be helpful as well in this circumstance. Electronically Signed   By: Bretta BangWilliam  Woodruff III M.D.   On: 11/08/2019 15:07   DG Chest Port 1 View  Result Date: 11/10/2019 CLINICAL DATA:  Shortness of breath, COVID-19 positive, congestion, history hypertension and stroke EXAM: PORTABLE CHEST 1 VIEW COMPARISON:  Portable exam 0838 hours compared to 11/08/2019 FINDINGS: Rotated to the RIGHT. Normal heart size, mediastinal contours, and pulmonary vascularity. Improved RIGHT upper lobe infiltrate and volume loss. Persistent bibasilar atelectasis. No new infiltrate, pleural effusion or pneumothorax. IMPRESSION: Bibasilar atelectasis. Improved RIGHT upper lobe  infiltrate and volume loss since previous study. Electronically Signed   By: Ulyses SouthwardMark  Boles M.D.   On: 11/10/2019 08:53   VAS US LOWER EXTREMITY VENOUS (DVT)  Result Date: 11/09/2019  Lower Venous DVT Study Indications: Elevated Ddimer.  Risk Factors: COVID 19 positive. Limitations: Body habitus, poor ultrasound/tissue interface and patient positioning. Comparison Study: No prior studies. Performing Technologist: Chanda BusingGregory Collins RVT  Examination Guidelines: A complete evaluation includes B-mode imaging, spectral Doppler, color Doppler, and power Doppler as needed of all accessible portions of each vessel. Bilateral testing is considered an integral part of a complete examination. Limited examinations for reoccurring indications may be performed as noted. The reflux portion of the exam is performed with the patient in reverse Trendelenburg.  +---------+---------------+---------+-----------+----------+--------------+ RIGHT    CompressibilityPhasicitySpontaneityPropertiesThrombus Aging +---------+---------------+---------+-----------+----------+--------------+ CFV      Full           Yes      Yes                                 +---------+---------------+---------+-----------+----------+--------------+ SFJ      Full                                                        +---------+---------------+---------+-----------+----------+--------------+ FV Prox  Full                                                        +---------+---------------+---------+-----------+----------+--------------+ FV Mid   Full                                                        +---------+---------------+---------+-----------+----------+--------------+ FV DistalFull                                                        +---------+---------------+---------+-----------+----------+--------------+  PFV      Full                                                         +---------+---------------+---------+-----------+----------+--------------+ POP      Full           Yes      Yes                                 +---------+---------------+---------+-----------+----------+--------------+ PTV      Full                                                        +---------+---------------+---------+-----------+----------+--------------+ PERO     Full                                                        +---------+---------------+---------+-----------+----------+--------------+   +---------+---------------+---------+-----------+----------+--------------+ LEFT     CompressibilityPhasicitySpontaneityPropertiesThrombus Aging +---------+---------------+---------+-----------+----------+--------------+ CFV      Full           Yes      Yes                                 +---------+---------------+---------+-----------+----------+--------------+ SFJ      Full                                                        +---------+---------------+---------+-----------+----------+--------------+ FV Prox  Full                                                        +---------+---------------+---------+-----------+----------+--------------+ FV Mid   Full                                                        +---------+---------------+---------+-----------+----------+--------------+ FV DistalFull                                                        +---------+---------------+---------+-----------+----------+--------------+ PFV      Full                                                        +---------+---------------+---------+-----------+----------+--------------+  POP      Full           Yes      Yes                                 +---------+---------------+---------+-----------+----------+--------------+ PTV      Full                                                         +---------+---------------+---------+-----------+----------+--------------+ PERO     Full                                                        +---------+---------------+---------+-----------+----------+--------------+     Summary: RIGHT: - There is no evidence of deep vein thrombosis in the lower extremity.  - No cystic structure found in the popliteal fossa.  LEFT: - There is no evidence of deep vein thrombosis in the lower extremity.  - No cystic structure found in the popliteal fossa.  *See table(s) above for measurements and observations. Electronically signed by Waverly Ferrari MD on 11/09/2019 at 6:18:42 PM.    Final     Scheduled Meds: . amLODipine  10 mg Oral Daily  . vitamin C  500 mg Oral Daily  . atorvastatin  20 mg Oral q1800  . baricitinib  2 mg Oral Daily  . clopidogrel  75 mg Oral Daily  . finasteride  5 mg Oral Daily  . insulin aspart  0-15 Units Subcutaneous TID WC  . insulin aspart  0-5 Units Subcutaneous QHS  . insulin aspart  5 Units Subcutaneous TID WC  . insulin detemir  7 Units Subcutaneous BID  . magnesium hydroxide  30 mL Oral Once  . methylPREDNISolone (SOLU-MEDROL) injection  40 mg Intravenous Q12H  . metoprolol tartrate  12.5 mg Oral BID  . pantoprazole (PROTONIX) IV  40 mg Intravenous Q12H  . polyethylene glycol  17 g Oral BID  . sodium polystyrene  15 g Oral Once  . zinc sulfate  220 mg Oral Daily   Continuous Infusions:    LOS: 8 days    Time spent: over 30 min    Susa Raring, MD [facility]   To contact the attending provider between 7A-7P or the covering provider during after hours 7P-7A, please log into the web site [email or web address] and access using universal [place] password for that web site. If you do not have the password, please call the hospital operator.  11/10/2019, 10:07 AM

## 2019-11-10 NOTE — Telephone Encounter (Signed)
11/30  at 130 pm appt with Dr Christella Hartigan. To be advised at discharge

## 2019-11-10 NOTE — Telephone Encounter (Signed)
-----   Message from Rachael Fee, MD sent at 11/10/2019  1:23 PM EDT ----- He needs OV with me in 6-7 weeks to discuss colon cancer screening.  Leaving hosp in next 2-3 days.  Thanks

## 2019-11-10 NOTE — Progress Notes (Signed)
Noted lower extremity vascular testing, no DVTs. Very unlikey that he's had PE and so there is no need for starting a new blood thinner.   OK to resume his usual plavix.  Please call if he has overt GI bleeding.  My office will contact him for follow up visit in 5-6 weeks to discuss colon cancer screening options.  Please call or page with any further questions or concerns.   Rob Bunting, MD [facility] Pager [phone]

## 2019-11-11 LAB — CBC WITH DIFFERENTIAL/PLATELET
Abs Immature Granulocytes: 0.54 10*3/uL — ABNORMAL HIGH (ref 0.00–0.07)
Basophils Absolute: 0 10*3/uL (ref 0.0–0.1)
Basophils Relative: 0 %
Eosinophils Absolute: 0 10*3/uL (ref 0.0–0.5)
Eosinophils Relative: 0 %
HCT: 46.1 % (ref 39.0–52.0)
Hemoglobin: 15.9 g/dL (ref 13.0–17.0)
Immature Granulocytes: 4 %
Lymphocytes Relative: 5 %
Lymphs Abs: 0.7 10*3/uL (ref 0.7–4.0)
MCH: 31.2 pg (ref 26.0–34.0)
MCHC: 34.5 g/dL (ref 30.0–36.0)
MCV: 90.4 fL (ref 80.0–100.0)
Monocytes Absolute: 0.5 10*3/uL (ref 0.1–1.0)
Monocytes Relative: 3 %
Neutro Abs: 13.3 10*3/uL — ABNORMAL HIGH (ref 1.7–7.7)
Neutrophils Relative %: 88 %
Platelets: 234 10*3/uL (ref 150–400)
RBC: 5.1 MIL/uL (ref 4.22–5.81)
RDW: 14 % (ref 11.5–15.5)
WBC: 15.2 10*3/uL — ABNORMAL HIGH (ref 4.0–10.5)
nRBC: 0.1 % (ref 0.0–0.2)

## 2019-11-11 LAB — COMPREHENSIVE METABOLIC PANEL
ALT: 84 U/L — ABNORMAL HIGH (ref 0–44)
AST: 24 U/L (ref 15–41)
Albumin: 2.7 g/dL — ABNORMAL LOW (ref 3.5–5.0)
Alkaline Phosphatase: 66 U/L (ref 38–126)
Anion gap: 12 (ref 5–15)
BUN: 34 mg/dL — ABNORMAL HIGH (ref 8–23)
CO2: 25 mmol/L (ref 22–32)
Calcium: 8.4 mg/dL — ABNORMAL LOW (ref 8.9–10.3)
Chloride: 102 mmol/L (ref 98–111)
Creatinine, Ser: 1.8 mg/dL — ABNORMAL HIGH (ref 0.61–1.24)
GFR calc Af Amer: 42 mL/min — ABNORMAL LOW (ref >60)
GFR calc non Af Amer: 37 mL/min — ABNORMAL LOW (ref >60)
Glucose, Bld: 168 mg/dL — ABNORMAL HIGH (ref 70–99)
Potassium: 5 mmol/L (ref 3.5–5.1)
Sodium: 139 mmol/L (ref 135–145)
Total Bilirubin: 1.4 mg/dL — ABNORMAL HIGH (ref 0.3–1.2)
Total Protein: 6.2 g/dL — ABNORMAL LOW (ref 6.5–8.1)

## 2019-11-11 LAB — GLUCOSE, CAPILLARY
Glucose-Capillary: 154 mg/dL — ABNORMAL HIGH (ref 70–99)
Glucose-Capillary: 416 mg/dL — ABNORMAL HIGH (ref 70–99)
Glucose-Capillary: 460 mg/dL — ABNORMAL HIGH (ref 70–99)
Glucose-Capillary: 367 mg/dL — ABNORMAL HIGH (ref 70–99)
Glucose-Capillary: 273 mg/dL — ABNORMAL HIGH (ref 70–99)
Glucose-Capillary: 275 mg/dL — ABNORMAL HIGH (ref 70–99)
Glucose-Capillary: 381 mg/dL — ABNORMAL HIGH (ref 70–99)

## 2019-11-11 LAB — BRAIN NATRIURETIC PEPTIDE: B Natriuretic Peptide: 41.1 pg/mL (ref 0.0–100.0)

## 2019-11-11 LAB — C-REACTIVE PROTEIN: CRP: <0.5 mg/dL (ref <1.0)

## 2019-11-11 LAB — PROCALCITONIN: Procalcitonin: <0.1 ng/mL

## 2019-11-11 LAB — MAGNESIUM: Magnesium: 2.5 mg/dL — ABNORMAL HIGH (ref 1.7–2.4)

## 2019-11-11 MED ORDER — INSULIN GLARGINE 100 UNIT/ML ~~LOC~~ SOLN
20.0000 [IU] | Freq: Every day | SUBCUTANEOUS | Status: DC
  Administered 2019-11-12: 20 [IU] via SUBCUTANEOUS
  Filled 2019-11-11 (×2): qty 0.2

## 2019-11-11 MED ORDER — METHYLPREDNISOLONE SODIUM SUCC 40 MG IJ SOLR
20.0000 mg | Freq: Once | INTRAMUSCULAR | Status: AC
  Administered 2019-11-11: 20 mg via INTRAVENOUS
  Filled 2019-11-11: qty 1

## 2019-11-11 MED ORDER — METHYLPREDNISOLONE SODIUM SUCC 40 MG IJ SOLR: 20.0000 mg | Freq: Every day | INTRAMUSCULAR | Status: DC

## 2019-11-11 MED ORDER — INSULIN DETEMIR 100 UNIT/ML ~~LOC~~ SOLN
10.0000 [IU] | Freq: Once | SUBCUTANEOUS | Status: AC
  Administered 2019-11-11: 10 [IU] via SUBCUTANEOUS
  Filled 2019-11-11 (×3): qty 0.1

## 2019-11-11 MED ORDER — SODIUM POLYSTYRENE SULFONATE 15 GM/60ML PO SUSP
30.0000 g | Freq: Once | ORAL | Status: AC
  Administered 2019-11-11: 30 g via ORAL
  Filled 2019-11-11: qty 120

## 2019-11-11 NOTE — Progress Notes (Signed)
Inpatient Rehab Admissions Coordinator:    Following up on pt's progress for my colleague.  Note pt should be clear to come off of precautions on 10/3 (Sunday), and continues to demo functional mobility that is below his baseline.  At this time, we are recommending a CIR consult to assess further for possible candidacy and I will place an order per our protocol.    Estill Dooms, PT, DPT Admissions Coordinator [phone] 11/11/19  12:49 PM

## 2019-11-11 NOTE — Progress Notes (Signed)
Physical Therapy Treatment Patient Details Name: Robert Buckley MRN: [id number] DOB: 11-Apr-1945 Today's Date: 11/11/2019    History of Present Illness 73yoM admitted with acute hypoxemic respiratory failure due to COVID-19 pneumonia pt with Acute metabolic encephalopathy PMH: CVA c Rt weakness and RW use. Noted (+) Covid test,    PT Comments    Patient willing to work with PT on getting stronger and moving better, however he reported he does not like the recliner chair as it gives him a headache and back pain. He was not willing to consider adaptations to improved comfort. He did engage in sitting and standing balance activities. Unable to progress to gait due to only +1 assist available. Patient appreciative of all efforts.     Follow Up Recommendations  CIR (noted not eligible until 10/3 due to airborne precautions)     Equipment Recommendations  None recommended by PT    Recommendations for Other Services       Precautions / Restrictions Precautions Precautions: Fall    Mobility  Bed Mobility Overal bed mobility: Needs Assistance Bed Mobility: Rolling;Sidelying to Sit;Sit to Sidelying Rolling: Supervision (with rail) Sidelying to sit: Min assist;HOB elevated (HOB 20)     Sit to sidelying: Mod assist (rail) General bed mobility comments: requires vc for technique; pt requires (A) to elevate trunk from surface; requires assist to raise legs onto bed  Transfers Overall transfer level: Needs assistance Equipment used: 1 person hand held assist Transfers: Sit to/from Stand Sit to Stand: Mod assist;From elevated surface         General transfer comment: Pt did not want to sit in recliner due to his back hurting; initial sit to stand x 3 from regular height bed; raised bed 4" for final 2 sit to stand due to pt fatigue and 1 person assist  Ambulation/Gait                 Stairs             Wheelchair Mobility    Modified Rankin (Stroke Patients  Only)       Balance Overall balance assessment: Needs assistance Sitting-balance support: Feet supported;No upper extremity supported Sitting balance-Leahy Scale: Poor Sitting balance - Comments: when makes sudden movements he can throw himself off-balance, at times needing assist. Able to engage in reaching and eating tasks without UE support with minguard assist   Standing balance support: During functional activity;Single extremity supported Standing balance-Leahy Scale: Poor Standing balance comment: PT on pt's left; vc for push off surface and come to stand, support to achieve upright and max time standing 20 seconds                            Cognition Arousal/Alertness: Awake/alert Behavior During Therapy: WFL for tasks assessed/performed Overall Cognitive Status: History of cognitive impairments - at baseline                                 General Comments: Pt will repeat self and often state "thats about all I can do" and then attempt to re-engage in the task. Minimally impulsive/moves quickly      Exercises      General Comments General comments (skin integrity, edema, etc.): On arrival, pt unaware he had been incontinent of stool and RN in to assist with cleaning patient/changing linens. On 3L throughout session with sats >92%  Pertinent Vitals/Pain Pain Assessment: Faces Faces Pain Scale: Hurts little more Pain Location: back Pain Descriptors / Indicators: Discomfort Pain Intervention(s): Limited activity within patient's tolerance;Monitored during session;Repositioned    Home Living                      Prior Function            PT Goals (current goals can now be found in the care plan section) Acute Rehab PT Goals Patient Stated Goal: to go home PT Goal Formulation: With patient Time For Goal Achievement: 11/19/19 Potential to Achieve Goals: Fair Progress towards PT goals: Progressing toward goals     Frequency    Min 3X/week      PT Plan Current plan remains appropriate    Co-evaluation              AM-PAC PT "6 Clicks" Mobility   Outcome Measure  Help needed turning from your back to your side while in a flat bed without using bedrails?: A Little Help needed moving from lying on your back to sitting on the side of a flat bed without using bedrails?: A Lot Help needed moving to and from a bed to a chair (including a wheelchair)?: A Lot Help needed standing up from a chair using your arms (e.g., wheelchair or bedside chair)?: A Lot Help needed to walk in hospital room?: A Lot Help needed climbing 3-5 steps with a railing? : Total 6 Click Score: 12    End of Session Equipment Utilized During Treatment: Gait belt Activity Tolerance: Patient tolerated treatment well Patient left: with call bell/phone within reach;in bed;with bed alarm set Nurse Communication: Mobility status;Other (comment) (does not like recliner due to back pain and HA) PT Visit Diagnosis: Difficulty in walking, not elsewhere classified (R26.2);Muscle weakness (generalized) (M62.81);Repeated falls (R29.6)     Time: 3785-8850 PT Time Calculation (min) (ACUTE ONLY): 46 min  Charges:  $Therapeutic Activity: 38-52 mins                      Jerolyn Center, PT Pager [phone]    Zena Amos 11/11/2019, 5:14 PM

## 2019-11-11 NOTE — Progress Notes (Signed)
IP rehab admissions - I am following this patient for potential acute inpatient rehab admission.  Noted patient to come off of precautions on 10/03.  I called a son of patient and left a message. I will follow up after I hear back from family.  Call me for questions.  [phone]

## 2019-11-11 NOTE — Evaluation (Signed)
Clinical/Bedside Swallow Evaluation Patient Details  Name: Robert Buckley MRN: [id number] Date of Birth: 23-Apr-1945  Today's Date: 11/11/2019 Time: SLP Start Time (ACUTE ONLY): 0805 SLP Stop Time (ACUTE ONLY): 0826 SLP Time Calculation (min) (ACUTE ONLY): 21 min  Past Medical History:  Past Medical History:  Diagnosis Date  . Arthritis   . Hypertension   . Stroke [facility]) 2008   residual R sided weakness   Past Surgical History: History reviewed. No pertinent surgical history. HPI:  74yo male admitted 11/09/2019 with RUE weakness. 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 MRI = no acute abnormality. ST on CIR 2018 - memory, problem solving, attention, safety. CT on 9/25: Increasing bibasilar opacities consistent with the given clinical history.   Assessment / Plan / Recommendation Clinical Impression  Patient presents with an oropharyngeal swallow that continues to be largely functional with patient's impulsivity as primary aspiration and dysphagia.  He easily passed 3 ounce Yale water screen without issues.  NO indication of aspiration or pt report of dysphagia.  He was noted to desat to mid 80s with intake without clinical signs of oxygen desaturation.  He continues with mild oral residuals on right lateral sulci which he independently clears.  SLP advised pt to monitor his work of breathing and take rest breaks as appropriate.   Encouraged him to eat slowly however from chart review, pt has been impulsive likely from prior cva.  No SLP follow up indicated as pt has been educated to compensation strategies and he is clinically managing po well. SLP Visit Diagnosis: Dysphagia, unspecified (R13.10)    Aspiration Risk  Mild aspiration risk    Diet Recommendation Regular;Thin liquid   Liquid Administration via: Straw;Cup Medication Administration: Whole meds with liquid Supervision: Patient able to self feed Compensations: Minimize environmental  distractions;Slow rate;Small sips/bites Postural Changes: Seated upright at 90 degrees;Remain upright for at least 30 minutes after po intake    Other  Recommendations Oral Care Recommendations: Oral care BID   Follow up Recommendations 24 hour supervision/assistance (no follow up for swallow function)      Frequency and Duration     n/a       Prognosis    n/a    Swallow Study   General HPI: 73yo male admitted 10/14/2019 with RUE weakness. 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 MRI = no acute abnormality. ST on CIR 2018 - memory, problem solving, attention, safety. CT on 9/25: Increasing bibasilar opacities consistent with the given clinical history. Type of Study: Bedside Swallow Evaluation Previous Swallow Assessment: BSE five days ago Diet Prior to this Study: Regular;Thin liquids Temperature Spikes Noted: No Respiratory Status: Nasal cannula History of Recent Intubation: No Behavior/Cognition: Alert;Cooperative;Impulsive Oral Cavity Assessment:  (? minimal atrophy of right tongue) Oral Care Completed by SLP: No Oral Cavity - Dentition: Adequate natural dentition Vision: Functional for self-feeding Self-Feeding Abilities: Able to feed self Patient Positioning: Upright in bed Baseline Vocal Quality: Normal Volitional Cough: Cognitively unable to elicit (pt declined to consume stating"I can't do that") Volitional Swallow: Able to elicit    Oral/Motor/Sensory Function Overall Oral Motor/Sensory Function: Mild impairment (minimal right lingual atrophy)   Ice Chips Ice chips: Not tested   Thin Liquid Thin Liquid: Within functional limits Presentation: Self Fed;Straw Other Comments: Yale 3 ounce water challenge easily passed    Nectar Thick Nectar Thick Liquid: Not tested   Honey Thick Honey Thick Liquid: Not tested   Puree Puree: Within  functional limits Presentation: Self Fed;Spoon   Solid     Solid: Impaired Oral Phase Impairments:  Impaired mastication Oral Phase Functional Implications: Right anterior spillage;Left anterior spillage Other Comments: Patient impulsive with eating and self feeding stating he has not eaten in "two hours" - minimal oral pocketing on right cleared independently      Robert Buckley 11/11/2019,8:38 AM   Robert Infante, MS [facility] SLP Acute Rehab Services Office [phone]

## 2019-11-11 NOTE — Progress Notes (Signed)
PROGRESS NOTE    Robert Buckley  QIO:[id number] DOB: 05/26/1945 DOA: 12-01-19 PCP: No primary care provider on file.   Chief Complaint  Patient presents with   Code Stroke    Brief Narrative:  Robert Buckley is an 74 y.o. male with past medical history of hypertension, CVA with residual right-sided weakness, CKD stage IIIa, obese, hyperlipidemia, recent COVID-19 infection presents to emergency department with right arm weakness.   Patient presented to ER with confusion and worsening right-sided weakness at 3 PM.  EMS noted patient's oxygen saturation 90% was placed on 2 L of oxygen via nasal cannula.  Code stroke was activated and patient was evaluated by neurology.  Initial CT concerning for thalamus hemorrhage however MRI brain came back negative shows findings most consistent with small focus of mineralization/calcification.  No intracranial hemorrhage.  MRA head and neck came back negative for acute findings.  Initially his confusion was thought to be related to ICH.   He denies alcohol, smoking, illicit drug use.  Of note: Patient admitted on 9/13 with acute hypoxemic respiratory failure secondary to COVID-19 pneumonia and discharged home in stable condition on 9/15.  He received 3 out of 5 doses of remdesivir while he was hospitalized and received 2 remaining doses outpatient.  He discharged home on 10 days of steroids.  He was admitted with encephalopathy and right sided weakness and concern for acute stroke.  CT initially with concern for parenchymal hemorrhage and patient admitted to neurology service.  MRI showed no acute intracranial hemorrhage and no acute intracranial abnormality.  Sx thought from recrudescence of prior stroke.  He's now being treated for COVID 19 infection with steroids and baricitinib.  Also on abx.  He's gradually improved.  Hospitalization c/b CXR with possible pulm infarct, placed on anticoagulation.  VQ scan low prob, LE [place] pending.  Concern for blood  in stool 9/28, heparin stopped, GI consult.     Subjective:  Patient in bed, appears comfortable, denies any headache, no fever, no chest pain or pressure, no shortness of breath , no abdominal pain. No new focal weakness.   Assessment & Plan:   Acute metabolic encephalopathy   Recrudescence of Chronic L Thalamic Infarct: Previous history of CVA with chronic right-sided weakness, MRI brain along with MRA brain and neck are nonacute, mental status back to normal, continue supportive care along with Plavix and statin for secondary prevention.  PT, OT and speech eval. Needs SNF.   COVID 19 Pneumonia   Possible Bacterial Pneumonia   Fever:  Recently treated for covid with steroids and remdesivir (2 days completed outpatient) -has been placed on combination of steroids and Baricitinib, has completed remdesivir treatment, also completed empiric antibiotic treatment.  Clinically improving.  Continue to advance activity and titrate down oxygen.    Recent Labs  Lab 11/04/19 1335 11/04/19 1550 11/05/19 0301 11/05/19 1807 11/06/19 0353 11/06/19 0353 11/07/19 0723 11/08/19 0302 11/09/19 0653 11/10/19 0616 11/11/19 0843  WBC 19.7*   < >  --    < > 14.1*   < > 12.8* 10.4 17.1* 14.5* 15.2*  CRP 15.9*   < > 10.7*  --  5.4*   < > 2.2* 1.1* 0.7 <0.5 <0.5  DDIMER 1.50*   < > 1.40*  --  1.03*  --  1.02* 1.13* 0.86* 0.75*  --   BNP  --   --   --   --  277.3*  --   --   --   --   --  41.1  PROCALCITON 0.78  --  0.72  --  0.42  --   --   --   --  <0.10  --   AST  --    < > 42*  --  76*   < > 47* 40 31 21 24   ALT  --    < > 51*  --  108*   < > 115* 106* 107* 90* 84*  ALKPHOS  --    < > 71  --  76   < > 68 59 58 62 66  BILITOT  --    < > 0.7  --  0.5   < > 0.9 0.7 0.5 0.9 1.4*  ALBUMIN  --    < > 2.1*  --  2.2*   < > 2.2* 2.0* 2.1* 2.3* 2.7*   < > = values in this interval not displayed.       SpO2: 91 % O2 Flow Rate (L/min): 2 L/min   Elevated D dimer   Possible Pulm Infarct on initial  imaging: Leg ultrasound negative, VQ scan low probability for PE.  D-dimer trending down, likely elevated due to inflammation from COVID-19 infection, continue prophylactic dose heparin and monitor.  Outpatient follow-up with pulmonary in 1 to 2 weeks post discharge.  Will defer this to PCP. Pulmonary infarct was ruled out and subsequent CT scan and chest x-rays.    Concern for blood in stool: No drop in H&H, no repeated episodes, this likely was due to constipation, monitor with bowel regimen and PPI twice daily.    AKI on CKD stage IIIb   NAGMA: -Creatinine: 2.07, GFR: 36 (baseline creatinine: 1.62, GFR: 48), improved after hydration with IV fluids.  Elevated BNP   Elevated Troponin:  BNP significantly elevated, ? Related to CKD/AKI, Elevated troponin, mild, suspect 2/2 demand in setting of Hypoxia.  TTE Non acute without any WMA, EF 60%.  Leukocytosis: Likely secondary to recent steroid use vs possible bacterial infection.  History of stroke with right-sided residual weakness: Can resume home p.o. meds-statin, plavix.  Hypertension: Stable, Continue home meds amlodipine and metoprolol.  Sinus Tachycardia: - improved.  No results found for: TSH   BPH: Continue Proscar.  Thrombocytopenia: -improved.   Prediabetes   Steroid Induced Hyperglycemia -  A1c 6.2, basal/bolus, adjust as needed.  CBG (last 3)  Recent Labs    11/10/19 1647 11/10/19 2157 11/11/19 0619  GLUCAP 163* 108* 154*       DVT prophylaxis: heparin Code Status: full  Family Communication:   11/13/19 [phone] - 11/10/19 at 10:12 AM, message left, updated 11/11/19  son 11/13/19 on [phone] on 11/10/2019 at 10:11 AM, message left.   Disposition:   Status is: Inpatient  Remains inpatient appropriate because:Inpatient level of care appropriate due to severity of illness   Dispo: The patient is from: Home              Anticipated d/c is to: pending              Anticipated d/c date is: > 3 days               Patient currently is not medically stable to d/c.   Consultants:   neurology  Procedures:   VQ - low probability  CTA -   1. No central or segmental pulmonary embolus. Unable to evaluate at the subsegmental level. 2. Right lung interstitial opacities may be partially due to the patient's right lateral decubitus positioning;  however, overlying infection cannot be excluded. Left hilar lymph node likely reactive. 3. 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  Leg US - No DVT   Echo  1. Left ventricular ejection fraction, by estimation, is 60 to 65%. The left ventricle has normal function. The left ventricle has no regional wall motion abnormalities. Left ventricular diastolic parameters are consistent with Grade I diastolic  dysfunction (impaired relaxation).  2. Right ventricular systolic function was not well visualized. The right ventricular size is not well visualized.  3. The mitral valve was not well visualized. No evidence of mitral valve regurgitation.  4. The aortic valve was not well visualized. Aortic valve regurgitation is not visualized.    Antimicrobials:  Anti-infectives (From admission, onward)   Start     Dose/Rate Route Frequency Ordered Stop   11/04/19 1600  cefTRIAXone (ROCEPHIN) 2 g in sodium chloride 0.9 % 100 mL IVPB        2 g 200 mL/hr over 30 Minutes Intravenous Every 24 hours 11/04/19 1445 11/09/19 0600   11/04/19 1600  azithromycin (ZITHROMAX) tablet 500 mg        500 mg Oral Daily 11/04/19 1445 11/08/19 0942   2019-12-13 1830  cefTRIAXone (ROCEPHIN) 1 g in sodium chloride 0.9 % 100 mL IVPB        1 g 200 mL/hr over 30 Minutes Intravenous  Once 2019-12-13 1821 2019-12-13 1937   2019-12-13 1830  azithromycin (ZITHROMAX) 500 mg in sodium chloride 0.9 % 250 mL IVPB        500 mg 250 mL/hr over 60 Minutes Intravenous  Once 2019-12-13 1821 2019-12-13 2221         Objective: Vitals:   11/10/19 2143 11/10/19 2343 11/11/19 0343  11/11/19 0900  BP: 128/79 132/83 132/89 109/73  Pulse: 68 (!) 51 (!) 57   Resp: (!) 24 12 (!) 22   Temp: (!) 97.4 F (36.3 C) 98.3 F (36.8 C) (!) 97.4 F (36.3 C) 98.7 F (37.1 C)  TempSrc: Axillary Axillary Axillary Oral  SpO2: 91% 92% 91%   Weight:      Height:        Intake/Output Summary (Last 24 hours) at 11/11/2019 1021 Last data filed at 11/10/2019 2100 Gross per 24 hour  Intake 320 ml  Output 600 ml  Net -280 ml   Filed Weights   11/04/19 0305  Weight: 79 kg    Examination:  Awake Alert, chronic right-sided weakness from previous stroke, no new F.N deficits, Normal affect [place].AT,PERRAL Supple Neck,No JVD, No cervical lymphadenopathy appriciated.  Symmetrical Chest wall movement, Good air movement bilaterally, CTAB RRR,No Gallops, Rubs or new Murmurs, No Parasternal Heave +ve B.Sounds, Abd Soft, No tenderness, No organomegaly appriciated, No rebound - guarding or rigidity. No Cyanosis, Clubbing or edema, No new Rash or bruise   Data Reviewed: I have personally reviewed following labs and imaging studies  CBC: Recent Labs  Lab 11/07/19 0723 11/07/19 0723 11/08/19 0302 11/09/19 0653 11/09/19 1115 11/10/19 0616 11/11/19 0843  WBC 12.8*  --  10.4 17.1*  --  14.5* 15.2*  NEUTROABS 11.7*  --  9.6* 15.8*  --  13.1* 13.3*  HGB 14.8   < > 13.2 13.6 14.0 13.6 15.9  HCT 44.8   < > 38.6* 38.9* 40.8 39.8 46.1  MCV 94.1  --  89.8 90.9  --  89.8 90.4  PLT 154  --  162 190  --  192 234   < > = values in this  interval not displayed.    Basic Metabolic Panel: Recent Labs  Lab 11/05/19 0301 11/05/19 0301 11/06/19 0353 11/06/19 0353 11/07/19 0723 11/08/19 0302 11/09/19 0653 11/10/19 0616 11/11/19 0843  NA 145   < > 141   < > 141 140 139 139 139  K 3.8   < > 4.1   < > 4.9 4.5 5.0 5.1 5.0  CL 115*   < > 112*   < > 110 109 110 108 102  CO2 20*   < > 20*   < > 19* GLUCOSE 253*   < > 295*   < > 163* 186* 201* 153* 168*  BUN 38*   < > 40*   < > 41*  44* 46* 40* 34*  CREATININE 1.88*   < > 1.82*   < > 1.62* 1.90* 1.74* 1.81* 1.80*  CALCIUM 8.2*   < > 8.1*   < > 8.0* 7.7* 7.7* 7.9* 8.4*  MG 2.5*   < > 2.4   < > 2.5* 2.3 2.3 2.3 2.5*  PHOS 3.2  --  3.2  --  3.8 3.3 3.7  --   --    < > = values in this interval not displayed.    GFR: Estimated Creatinine Clearance: 34.2 mL/min (A) (by C-G formula based on SCr of 1.8 mg/dL (H)).  Liver Function Tests: Recent Labs  Lab 11/07/19 0723 11/08/19 0302 11/09/19 0653 11/10/19 0616 11/11/19 0843  AST 47* 40 ALT 115* 106* 107* 90* 84*  ALKPHOS 68 59 58 62 66  BILITOT 0.9 0.7 0.5 0.9 1.4*  PROT 5.9* 5.3* 5.0* 5.3* 6.2*  ALBUMIN 2.2* 2.0* 2.1* 2.3* 2.7*    CBG: Recent Labs  Lab 11/10/19 0636 11/10/19 1123 11/10/19 1647 11/10/19 2157 11/11/19 0619  GLUCAP 153* 233* 163* 108* 154*     Recent Results (from the past 240 hour(s))  Culture, blood (routine x 2)     Status: None   Collection Time: 2019/11/08  6:52 PM   Specimen: BLOOD LEFT ARM  Result Value Ref Range Status   Specimen Description BLOOD LEFT ARM  Final   Special Requests   Final    BOTTLES DRAWN AEROBIC AND ANAEROBIC Blood Culture adequate volume   Culture   Final    NO GROWTH 5 DAYS Performed at [facility], [address], [place], [place] [address]    Report Status 11/07/2019 FINAL  Final  Culture, blood (routine x 2)     Status: None   Collection Time: Nov 08, 2019  6:53 PM   Specimen: BLOOD  Result Value Ref Range Status   Specimen Description BLOOD LEFT ANTECUBITAL  Final   Special Requests   Final    BOTTLES DRAWN AEROBIC AND ANAEROBIC Blood Culture adequate volume   Culture   Final    NO GROWTH 5 DAYS Performed at [facility], [address], [place], [place] [address]    Report Status 11/07/2019 FINAL  Final  MRSA PCR Screening     Status: None   Collection Time: 11/04/19  7:05 PM   Specimen: Nasopharyngeal  Result Value Ref Range Status   MRSA by PCR NEGATIVE NEGATIVE Final      Comment:        The GeneXpert MRSA Assay (FDA approved for NASAL specimens only), is one component of a comprehensive MRSA colonization surveillance program. It is not intended to diagnose MRSA infection nor to guide or  monitor treatment for MRSA infections. Performed at [facility], [address], [place], [place] [address]          Radiology Studies: DG Chest Port 1 View  Result Date: 11/10/2019 CLINICAL DATA:  Shortness of breath, COVID-19 positive, congestion, history hypertension and stroke EXAM: PORTABLE CHEST 1 VIEW COMPARISON:  Portable exam 0838 hours compared to 11/08/2019 FINDINGS: Rotated to the RIGHT. Normal heart size, mediastinal contours, and pulmonary vascularity. Improved RIGHT upper lobe infiltrate and volume loss. Persistent bibasilar atelectasis. No new infiltrate, pleural effusion or pneumothorax. IMPRESSION: Bibasilar atelectasis. Improved RIGHT upper lobe infiltrate and volume loss since previous study. Electronically Signed   By: Ulyses Southward M.D.   On: 11/10/2019 08:53   VAS [place] LOWER EXTREMITY VENOUS (DVT)  Result Date: 11/09/2019  Lower Venous DVT Study Indications: Elevated Ddimer.  Risk Factors: COVID 19 positive. Limitations: Body habitus, poor ultrasound/tissue interface and patient positioning. Comparison Study: No prior studies. Performing Technologist: Chanda Busing RVT  Examination Guidelines: A complete evaluation includes B-mode imaging, spectral Doppler, color Doppler, and power Doppler as needed of all accessible portions of each vessel. Bilateral testing is considered an integral part of a complete examination. Limited examinations for reoccurring indications may be performed as noted. The reflux portion of the exam is performed with the patient in reverse Trendelenburg.  +---------+---------------+---------+-----------+----------+--------------+  RIGHT     Compressibility Phasicity Spontaneity Properties Thrombus Aging   +---------+---------------+---------+-----------+----------+--------------+  CFV       Full            Yes       Yes                                    +---------+---------------+---------+-----------+----------+--------------+  SFJ       Full                                                             +---------+---------------+---------+-----------+----------+--------------+  FV Prox   Full                                                             +---------+---------------+---------+-----------+----------+--------------+  FV Mid    Full                                                             +---------+---------------+---------+-----------+----------+--------------+  FV Distal Full                                                             +---------+---------------+---------+-----------+----------+--------------+  PFV       Full                                                             +---------+---------------+---------+-----------+----------+--------------+  POP       Full            Yes       Yes                                    +---------+---------------+---------+-----------+----------+--------------+  PTV       Full                                                             +---------+---------------+---------+-----------+----------+--------------+  PERO      Full                                                             +---------+---------------+---------+-----------+----------+--------------+   +---------+---------------+---------+-----------+----------+--------------+  LEFT      Compressibility Phasicity Spontaneity Properties Thrombus Aging  +---------+---------------+---------+-----------+----------+--------------+  CFV       Full            Yes       Yes                                    +---------+---------------+---------+-----------+----------+--------------+  SFJ       Full                                                              +---------+---------------+---------+-----------+----------+--------------+  FV Prox   Full                                                             +---------+---------------+---------+-----------+----------+--------------+  FV Mid    Full                                                             +---------+---------------+---------+-----------+----------+--------------+  FV Distal Full                                                             +---------+---------------+---------+-----------+----------+--------------+  PFV       Full                                                             +---------+---------------+---------+-----------+----------+--------------+  POP       Full            Yes       Yes                                    +---------+---------------+---------+-----------+----------+--------------+  PTV       Full                                                             +---------+---------------+---------+-----------+----------+--------------+  PERO      Full                                                             +---------+---------------+---------+-----------+----------+--------------+     Summary: RIGHT: - There is no evidence of deep vein thrombosis in the lower extremity.  - No cystic structure found in the popliteal fossa.  LEFT: - There is no evidence of deep vein thrombosis in the lower extremity.  - No cystic structure found in the popliteal fossa.  *See table(s) above for measurements and observations. Electronically signed by Waverly Ferrari MD on 11/09/2019 at 6:18:42 PM.    Final     Scheduled Meds:  amLODipine  10 mg Oral Daily   vitamin C  500 mg Oral Daily   atorvastatin  20 mg Oral q1800   baricitinib  2 mg Oral Daily   clopidogrel  75 mg Oral Daily   finasteride  5 mg Oral Daily   heparin injection (subcutaneous)  5,000 Units Subcutaneous Q8H   insulin aspart  0-15 Units Subcutaneous TID WC   insulin aspart  0-5 Units Subcutaneous QHS    insulin aspart  5 Units Subcutaneous TID WC   insulin detemir  7 Units Subcutaneous BID   methylPREDNISolone (SOLU-MEDROL) injection  40 mg Intravenous Q12H   metoprolol tartrate  12.5 mg Oral BID   pantoprazole (PROTONIX) IV  40 mg Intravenous Q12H   polyethylene glycol  17 g Oral BID   sodium polystyrene  30 g Oral Once   zinc sulfate  220 mg Oral Daily   Continuous Infusions:    LOS: 9 days    Time spent: over 30 min    Susa Raring, MD [facility]   To contact the attending provider between 7A-7P or the covering provider during after hours 7P-7A, please log into the web site [email or web address] and access using universal  password for that web site. If you do not have the password, please call the hospital operator.  11/11/2019, 10:21 AM

## 2019-11-12 LAB — CBC WITH DIFFERENTIAL/PLATELET
Abs Immature Granulocytes: 0.28 10*3/uL — ABNORMAL HIGH (ref 0.00–0.07)
Basophils Absolute: 0 10*3/uL (ref 0.0–0.1)
Basophils Relative: 0 %
Eosinophils Absolute: 0 10*3/uL (ref 0.0–0.5)
Eosinophils Relative: 0 %
HCT: 43.2 % (ref 39.0–52.0)
Hemoglobin: 15.1 g/dL (ref 13.0–17.0)
Immature Granulocytes: 2 %
Lymphocytes Relative: 8 %
Lymphs Abs: 0.9 10*3/uL (ref 0.7–4.0)
MCH: 31.6 pg (ref 26.0–34.0)
MCHC: 35 g/dL (ref 30.0–36.0)
MCV: 90.4 fL (ref 80.0–100.0)
Monocytes Absolute: 0.5 10*3/uL (ref 0.1–1.0)
Monocytes Relative: 5 %
Neutro Abs: 9.7 10*3/uL — ABNORMAL HIGH (ref 1.7–7.7)
Neutrophils Relative %: 85 %
Platelets: 160 10*3/uL (ref 150–400)
RBC: 4.78 MIL/uL (ref 4.22–5.81)
RDW: 14.4 % (ref 11.5–15.5)
WBC: 11.5 10*3/uL — ABNORMAL HIGH (ref 4.0–10.5)
nRBC: 0 % (ref 0.0–0.2)

## 2019-11-12 LAB — BRAIN NATRIURETIC PEPTIDE: B Natriuretic Peptide: 26.2 pg/mL (ref 0.0–100.0)

## 2019-11-12 LAB — C-REACTIVE PROTEIN: CRP: 0.6 mg/dL (ref <1.0)

## 2019-11-12 LAB — COMPREHENSIVE METABOLIC PANEL
ALT: 93 U/L — ABNORMAL HIGH (ref 0–44)
AST: 21 U/L (ref 15–41)
Albumin: 2.5 g/dL — ABNORMAL LOW (ref 3.5–5.0)
Alkaline Phosphatase: 58 U/L (ref 38–126)
Anion gap: 10 (ref 5–15)
BUN: 35 mg/dL — ABNORMAL HIGH (ref 8–23)
CO2: 26 mmol/L (ref 22–32)
Calcium: 7.9 mg/dL — ABNORMAL LOW (ref 8.9–10.3)
Chloride: 107 mmol/L (ref 98–111)
Creatinine, Ser: 1.74 mg/dL — ABNORMAL HIGH (ref 0.61–1.24)
GFR calc Af Amer: 44 mL/min — ABNORMAL LOW (ref >60)
GFR calc non Af Amer: 38 mL/min — ABNORMAL LOW (ref >60)
Glucose, Bld: 105 mg/dL — ABNORMAL HIGH (ref 70–99)
Potassium: 3.7 mmol/L (ref 3.5–5.1)
Sodium: 143 mmol/L (ref 135–145)
Total Bilirubin: 1 mg/dL (ref 0.3–1.2)
Total Protein: 5.3 g/dL — ABNORMAL LOW (ref 6.5–8.1)

## 2019-11-12 LAB — TSH: TSH: 1.914 u[IU]/mL (ref 0.350–4.500)

## 2019-11-12 LAB — GLUCOSE, CAPILLARY
Glucose-Capillary: 168 mg/dL — ABNORMAL HIGH (ref 70–99)
Glucose-Capillary: 162 mg/dL — ABNORMAL HIGH (ref 70–99)
Glucose-Capillary: 111 mg/dL — ABNORMAL HIGH (ref 70–99)
Glucose-Capillary: 236 mg/dL — ABNORMAL HIGH (ref 70–99)

## 2019-11-12 LAB — PROCALCITONIN: Procalcitonin: <0.1 ng/mL

## 2019-11-12 LAB — MAGNESIUM: Magnesium: 2.4 mg/dL (ref 1.7–2.4)

## 2019-11-12 NOTE — Progress Notes (Signed)
[facility] IP rehab admissions - I was able to speak with one of the patient's sons today, Derrick.  I discussed inpatient rehab admission with him and he is agreeable.  I answered many questions for Derrick.  I have opened the case and have requested acute inpatient rehab admission.  I will have my partner, Vernona Rieger, follow up.  We will not hear back from insurance carrier until at least Monday.  Call me for questions.  [phone]

## 2019-11-12 NOTE — Progress Notes (Signed)
PT Cancellation Note  Patient Details Name: Robert Buckley MRN: [id number] DOB: 07-16-1945   Cancelled Treatment:    Reason Eval/Treat Not Completed: (P) Patient declined, no reason specified (Pt admantly refused despite education and encouragement.  Reports," I do not want to get better.  I'm tired.  Leave me alone."  Education and encouragement provided and he continues to decline.)   [name] [name] 11/12/2019, 2:36 PM  Bonney Leitz , PTA Acute Rehabilitation Services Pager [phone] Office [phone]

## 2019-11-12 NOTE — Progress Notes (Signed)
PROGRESS NOTE    Robert Buckley  AOZ:[id number] DOB: 02/24/1945 DOA: 2019-11-29 PCP: No primary care provider on file.   Chief Complaint  Patient presents with  . Code Stroke    Brief Narrative:  Robert Buckley is an 74 y.o. male with past medical history of hypertension, CVA with residual right-sided weakness, CKD stage IIIa, obese, hyperlipidemia, recent COVID-19 infection presents to emergency department with right arm weakness.   Patient presented to ER with confusion and worsening right-sided weakness at 3 PM.  EMS noted patient's oxygen saturation 90% was placed on 2 L of oxygen via nasal cannula.  Code stroke was activated and patient was evaluated by neurology.  Initial CT concerning for thalamus hemorrhage however MRI brain came back negative shows findings most consistent with small focus of mineralization/calcification.  No intracranial hemorrhage.  MRA head and neck came back negative for acute findings.  Initially his confusion was thought to be related to ICH.   He denies alcohol, smoking, illicit drug use.  Of note: Patient admitted on 9/13 with acute hypoxemic respiratory failure secondary to COVID-19 pneumonia and discharged home in stable condition on 9/15.  He received 3 out of 5 doses of remdesivir while he was hospitalized and received 2 remaining doses outpatient.  He discharged home on 10 days of steroids.  He was admitted with encephalopathy and right sided weakness and concern for acute stroke.  CT initially with concern for parenchymal hemorrhage and patient admitted to neurology service.  MRI showed no acute intracranial hemorrhage and no acute intracranial abnormality.  Sx thought from recrudescence of prior stroke.  He's now being treated for COVID 19 infection with steroids and baricitinib.  Also on abx.  He's gradually improved.  Hospitalization c/b CXR with possible pulm infarct, placed on anticoagulation.  VQ scan low prob, LE US pending.  Concern for blood  in stool 9/28, heparin stopped, GI consult.     Subjective:  Patient in bed, appears comfortable, denies any headache, no fever, no chest pain or pressure, no shortness of breath , no abdominal pain. No new focal weakness, chronic right-sided weakness from previous stroke.    Assessment & Plan:   Acute metabolic encephalopathy  Recrudescence of Chronic L Thalamic Infarct: Previous history of CVA with chronic right-sided weakness, MRI brain along with MRA brain and neck are nonacute, mental status back to normal, continue supportive care along with Plavix and statin for secondary prevention.  PT, OT and speech eval. Needs SNF.   COVID 19 Pneumonia  Possible Bacterial Pneumonia  Fever:  Recently treated for covid with steroids and remdesivir (2 days completed outpatient) - was placed on combination of steroids and Baricitinib, has completed steroid, remdesivir treatment, also completed empiric antibiotic treatment.  Clinically improving.  Continue to advance activity and titrate down oxygen, on room air on 11/12/2019.   His original positive test was 10/24/2019 and he will be in quarantine for total of 21 days.  Recent Labs  Lab 11/06/19 0353 11/06/19 0353 11/07/19 0723 11/08/19 0302 11/09/19 0653 11/10/19 0616 11/11/19 0843  WBC 14.1*   < > 12.8* 10.4 17.1* 14.5* 15.2*  CRP 5.4*   < > 2.2* 1.1* 0.7 <0.5 <0.5  DDIMER 1.03*  --  1.02* 1.13* 0.86* 0.75*  --   BNP 277.3*  --   --   --   --   --  41.1  PROCALCITON 0.42  --   --   --   --  <0.10 <0.10  AST 76*   < >  47* 40 31 21 24   ALT 108*   < > 115* 106* 107* 90* 84*  ALKPHOS 76   < > 68 59 58 62 66  BILITOT 0.5   < > 0.9 0.7 0.5 0.9 1.4*  ALBUMIN 2.2*   < > 2.2* 2.0* 2.1* 2.3* 2.7*   < > = values in this interval not displayed.         Elevated D dimer  Possible Pulm Infarct on initial imaging: Leg ultrasound negative, VQ scan low probability for PE.  D-dimer trending down, likely elevated due to inflammation from COVID-19  infection, continue prophylactic dose heparin and monitor.  Outpatient follow-up with pulmonary in 1 to 2 weeks post discharge.  Will defer this to PCP. Pulmonary infarct was ruled out and subsequent CT scan and chest x-rays.    Concern for blood in stool: No drop in H&H, no repeated episodes, this likely was due to constipation, monitor with bowel regimen and PPI twice daily.    AKI on CKD stage IIIb  NAGMA: -Creatinine: 2.07, GFR: 36 (baseline creatinine: 1.62, GFR: 48), improved after hydration with IV fluids.  Elevated BNP  Elevated Troponin:  BNP significantly elevated, ? Related to CKD/AKI, Elevated troponin, mild, suspect 2/2 demand in setting of Hypoxia.  TTE Non acute without any WMA, EF 60%.  Leukocytosis: Likely secondary to recent steroid use vs possible bacterial infection.  History of stroke with right-sided residual weakness: Can resume home p.o. meds-statin, plavix.  Hypertension: Stable, Continue home meds amlodipine and metoprolol.  Sinus Tachycardia: - improved.  No results found for: TSH   BPH: Continue Proscar.  Thrombocytopenia: -improved.   Prediabetes  Steroid Induced Hyperglycemia -  A1c 6.2, basal/bolus, adjust as needed.  CBG (last 3)  Recent Labs    11/11/19 1702 11/11/19 2114 11/12/19 0617  GLUCAP 275* 381* 168*       DVT prophylaxis: heparin Code Status: full  Family Communication:   01/12/20 [phone] - 11/10/19 at 10:12 AM, message left, updated 11/11/19  son 11/13/19 on [phone] on 11/10/2019 at 10:11 AM, message left.   Disposition:   Status is: Inpatient  Remains inpatient appropriate because:Inpatient level of care appropriate due to severity of illness   Dispo: The patient is from: Home              Anticipated d/c is to: pending              Anticipated d/c date is: > 3 days              Patient currently is not medically stable to d/c.   Consultants:   neurology  Procedures:   VQ - low probability  CTA  -   1. No central or segmental pulmonary embolus. Unable to evaluate at the subsegmental level. 2. Right lung interstitial opacities may be partially due to the patient's right lateral decubitus positioning; however, overlying infection cannot be excluded. Left hilar lymph node likely reactive. 3. 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  Leg 11/12/2019 - No DVT   Echo  1. Left ventricular ejection fraction, by estimation, is 60 to 65%. The left ventricle has normal function. The left ventricle has no regional wall motion abnormalities. Left ventricular diastolic parameters are consistent with Grade I diastolic  dysfunction (impaired relaxation).  2. Right ventricular systolic function was not well visualized. The right ventricular size is not well visualized.  3. The mitral valve was not well visualized. No evidence of mitral valve  regurgitation.  4. The aortic valve was not well visualized. Aortic valve regurgitation is not visualized.    Antimicrobials:  Anti-infectives (From admission, onward)   Start     Dose/Rate Route Frequency Ordered Stop   11/04/19 1600  cefTRIAXone (ROCEPHIN) 2 g in sodium chloride 0.9 % 100 mL IVPB        2 g 200 mL/hr over 30 Minutes Intravenous Every 24 hours 11/04/19 1445 11/09/19 0600   11/04/19 1600  azithromycin (ZITHROMAX) tablet 500 mg        500 mg Oral Daily 11/04/19 1445 11/08/19 0942   2019-11-11 1830  cefTRIAXone (ROCEPHIN) 1 g in sodium chloride 0.9 % 100 mL IVPB        1 g 200 mL/hr over 30 Minutes Intravenous  Once 11-11-19 1821 11/11/2019 1937   2019-11-11 1830  azithromycin (ZITHROMAX) 500 mg in sodium chloride 0.9 % 250 mL IVPB        500 mg 250 mL/hr over 60 Minutes Intravenous  Once 11/11/2019 1821 Nov 11, 2019 2221       Objective: Vitals:   11/11/19 2300 11/12/19 0300 11/12/19 0802 11/12/19 0910  BP: 124/77 124/71 (!) 127/91 138/90  Pulse: 74 69 (!) 57 68  Resp: Temp: 98.4 F (36.9 C)  98.2 F (36.8 C)     TempSrc: Oral Oral Oral   SpO2: 94% 94% 90%   Weight:      Height:        Intake/Output Summary (Last 24 hours) at 11/12/2019 0937 Last data filed at 11/11/2019 1704 Gross per 24 hour  Intake 320 ml  Output 1400 ml  Net -1080 ml   Filed Weights   11/04/19 0305  Weight: 79 kg    Examination:  Awake Alert, chronic right-sided weakness from previous stroke, no new F.N deficits, Normal affect [place].AT,PERRAL Supple Neck,No JVD, No cervical lymphadenopathy appriciated.  Symmetrical Chest wall movement, Good air movement bilaterally, CTAB RRR,No Gallops, Rubs or new Murmurs, No Parasternal Heave +ve B.Sounds, Abd Soft, No tenderness, No organomegaly appriciated, No rebound - guarding or rigidity. No Cyanosis, Clubbing or edema, No new Rash or bruise     Data Reviewed: I have personally reviewed following labs and imaging studies  CBC: Recent Labs  Lab 11/07/19 0723 11/07/19 0723 11/08/19 0302 11/09/19 0653 11/09/19 1115 11/10/19 0616 11/11/19 0843  WBC 12.8*  --  10.4 17.1*  --  14.5* 15.2*  NEUTROABS 11.7*  --  9.6* 15.8*  --  13.1* 13.3*  HGB 14.8   < > 13.2 13.6 14.0 13.6 15.9  HCT 44.8   < > 38.6* 38.9* 40.8 39.8 46.1  MCV 94.1  --  89.8 90.9  --  89.8 90.4  PLT 154  --  162 190  --  192 234   < > = values in this interval not displayed.    Basic Metabolic Panel: Recent Labs  Lab 11/06/19 0353 11/06/19 0353 11/07/19 0723 11/08/19 0302 11/09/19 0653 11/10/19 0616 11/11/19 0843  NA 141   < > 141 140 139 139 139  K 4.1   < > 4.9 4.5 5.0 5.1 5.0  CL 112*   < > 110 109 110 108 102  CO2 20*   < > 19* GLUCOSE 295*   < > 163* 186* 201* 153* 168*  BUN 40*   < > 41* 44* 46* 40* 34*  CREATININE 1.82*   < > 1.62* 1.90* 1.74* 1.81* 1.80*  CALCIUM 8.1*   < > 8.0* 7.7* 7.7* 7.9* 8.4*  MG 2.4   < > 2.5* 2.3 2.3 2.3 2.5*  PHOS 3.2  --  3.8 3.3 3.7  --   --    < > = values in this interval not displayed.    GFR: Estimated Creatinine Clearance: 34.2  mL/min (A) (by C-G formula based on SCr of 1.8 mg/dL (H)).  Liver Function Tests: Recent Labs  Lab 11/07/19 0723 11/08/19 0302 11/09/19 0653 11/10/19 0616 11/11/19 0843  AST 47* 40 31 21 24   ALT 115* 106* 107* 90* 84*  ALKPHOS 68 59 58 62 66  BILITOT 0.9 0.7 0.5 0.9 1.4*  PROT 5.9* 5.3* 5.0* 5.3* 6.2*  ALBUMIN 2.2* 2.0* 2.1* 2.3* 2.7*    CBG: Recent Labs  Lab 11/11/19 1335 11/11/19 1527 11/11/19 1702 11/11/19 2114 11/12/19 0617  GLUCAP 367* 273* 275* 381* 168*     Recent Results (from the past 240 hour(s))  Culture, blood (routine x 2)     Status: None   Collection Time: 10/25/2019  6:52 PM   Specimen: BLOOD LEFT ARM  Result Value Ref Range Status   Specimen Description BLOOD LEFT ARM  Final   Special Requests   Final    BOTTLES DRAWN AEROBIC AND ANAEROBIC Blood Culture adequate volume   Culture   Final    NO GROWTH 5 DAYS Performed at [facility], [address], [place], [place]    Report Status 11/07/2019 FINAL  Final  Culture, blood (routine x 2)     Status: None   Collection Time: 10/16/2019  6:53 PM   Specimen: BLOOD  Result Value Ref Range Status   Specimen Description BLOOD LEFT ANTECUBITAL  Final   Special Requests   Final    BOTTLES DRAWN AEROBIC AND ANAEROBIC Blood Culture adequate volume   Culture   Final    NO GROWTH 5 DAYS Performed at [facility], [address], [place], [place]    Report Status 11/07/2019 FINAL  Final  MRSA PCR Screening     Status: None   Collection Time: 11/04/19  7:05 PM   Specimen: Nasopharyngeal  Result Value Ref Range Status   MRSA by PCR NEGATIVE NEGATIVE Final    Comment:        The GeneXpert MRSA Assay (FDA approved for NASAL specimens only), is one component of a comprehensive MRSA colonization surveillance program. It is not intended to diagnose MRSA infection nor to guide or monitor treatment for MRSA infections. Performed at [facility], [address],  [place], [place]       Radiology Studies: No results found.  Scheduled Meds: . amLODipine  10 mg Oral Daily  . vitamin C  500 mg Oral Daily  . atorvastatin  20 mg Oral q1800  . baricitinib  2 mg Oral Daily  . clopidogrel  75 mg Oral Daily  . finasteride  5 mg Oral Daily  . heparin injection (subcutaneous)  5,000 Units Subcutaneous Q8H  . insulin aspart  0-15 Units Subcutaneous TID WC  . insulin aspart  0-5 Units Subcutaneous QHS  . insulin glargine  20 Units Subcutaneous Daily  . metoprolol tartrate  12.5 mg Oral BID  . pantoprazole (PROTONIX) IV  40 mg Intravenous Q12H  . polyethylene glycol  17 g Oral BID  . zinc sulfate  220 mg Oral Daily   Continuous Infusions:    LOS: 10 days  Time spent: over 30 min  Signature  Susa Raring M.D on 11/12/2019 at 9:37 AM   -  To page go to [email or web address]

## 2019-11-12 DEATH — deceased

## 2019-11-13 LAB — GLUCOSE, CAPILLARY
Glucose-Capillary: 74 mg/dL (ref 70–99)
Glucose-Capillary: 125 mg/dL — ABNORMAL HIGH (ref 70–99)
Glucose-Capillary: 122 mg/dL — ABNORMAL HIGH (ref 70–99)
Glucose-Capillary: 163 mg/dL — ABNORMAL HIGH (ref 70–99)
Glucose-Capillary: 207 mg/dL — ABNORMAL HIGH (ref 70–99)

## 2019-11-13 MED ORDER — LORAZEPAM 2 MG/ML IJ SOLN
INTRAMUSCULAR | Status: AC
  Filled 2019-11-13: qty 1

## 2019-11-13 MED ORDER — PANTOPRAZOLE SODIUM 40 MG PO TBEC
40.0000 mg | DELAYED_RELEASE_TABLET | Freq: Two times a day (BID) | ORAL | Status: DC
  Administered 2019-11-13: 40 mg via ORAL
  Filled 2019-11-13: qty 1

## 2019-11-13 MED ORDER — INSULIN GLARGINE 100 UNIT/ML ~~LOC~~ SOLN
15.0000 [IU] | Freq: Every day | SUBCUTANEOUS | Status: DC
  Administered 2019-11-13 – 2019-11-16 (×5): 15 [IU] via SUBCUTANEOUS
  Filled 2019-11-13 (×5): qty 0.15

## 2019-11-13 MED ORDER — PANTOPRAZOLE SODIUM 40 MG PO TBEC: 40.0000 mg | DELAYED_RELEASE_TABLET | Freq: Every day | ORAL | Status: DC

## 2019-11-13 NOTE — Progress Notes (Signed)
PROGRESS NOTE    Robert Buckley  DZH:[id number] DOB: Nov 07, 1945 DOA: 11-23-2019 PCP: Loraine Grip, MD   Chief Complaint  Patient presents with  . Code Stroke    Brief Narrative:  Robert Buckley is an 74 y.o. male with past medical history of hypertension, CVA with residual right-sided weakness, CKD stage IIIa, obese, hyperlipidemia, recent COVID-19 infection prior to this admission which was treated prior to this admission presented to emergency department with right arm weakness, confusion and some shortness of breath.   He was admitted with encephalopathy and right sided weakness and concern for acute stroke.  CT initially with concern for parenchymal hemorrhage and patient admitted to neurology service.  MRI showed no acute intracranial hemorrhage and no acute intracranial abnormality.  Sx thought from recrudescence of prior stroke.  He's now being treated for COVID 19 infection with steroids and baricitinib.  His COVID-19 related treatment has been finished.  He is off of isolation on 11/14/2019.  Very close to his baseline and awaits placement.    Subjective:  Patient in bed, appears comfortable, denies any headache, no fever, no chest pain or pressure, no shortness of breath , no abdominal pain. No focal weakness, chronic right-sided weakness from previous stroke.    Assessment & Plan:   Acute metabolic encephalopathy  Recrudescence of Chronic L Thalamic Infarct: Previous history of CVA with chronic right-sided weakness, MRI brain along with MRA brain and neck are nonacute, mental status back to normal, continue supportive care along with Plavix and statin for secondary prevention.  PT, OT and speech eval. Needs SNF/CIR.   Acute hypoxic respiratory failure due to COVID 19 Pneumonia  Possible Bacterial Pneumonia  Fever:  Recently treated for covid with steroids and remdesivir (2 days completed outpatient) - he has finished his treatment with combination of steroids and  Baricitinib long with empiric antibiotics as initially there was question that he had a bacterial component to his infection as well.  Clinically improving and currently symptom-free on room air, pulmonary issues seem to have completely resolved.    His original positive test was 10/24/2019 and he will be in quarantine for total of 21 days, remove Covid isolation on 11/14/2019.  Recent Labs  Lab 11/07/19 0723 11/07/19 0723 11/08/19 0302 11/09/19 0653 11/10/19 0616 11/11/19 0843 11/12/19 0959  WBC 12.8*   < > 10.4 17.1* 14.5* 15.2* 11.5*  CRP 2.2*   < > 1.1* 0.7 <0.5 <0.5 0.6  DDIMER 1.02*  --  1.13* 0.86* 0.75*  --   --   BNP  --   --   --   --   --  41.1 26.2  PROCALCITON  --   --   --   --  <0.10 <0.10 <0.10  AST 47*   < > 40 31 21 24 21   ALT 115*   < > 106* 107* 90* 84* 93*  ALKPHOS 68   < > 59 58 62 66 58  BILITOT 0.9   < > 0.7 0.5 0.9 1.4* 1.0  ALBUMIN 2.2*   < > 2.0* 2.1* 2.3* 2.7* 2.5*   < > = values in this interval not displayed.         Elevated D dimer : Leg ultrasound negative, VQ scan Buckley probability for PE.  CTA is stable with no evidence of infarct or PE.  MR improved on prophylactic anticoagulation.  Likely was high due to inflammation from Covid.   Concern for blood in stool: No drop in H&H, no  repeated episodes, this likely was due to constipation, monitor with bowel regimen and PPI twice daily.  Was seen by GI this admission.   AKI on CKD stage IIIb  NAGMA: -Creatinine: 2.07, GFR: 36 (baseline creatinine: 1.62, GFR: 48), improved after hydration with IV fluids now close to baseline.  Elevated BNP  Elevated Troponin:  BNP significantly elevated, ? Related to CKD/AKI, Elevated troponin, mild, suspect 2/2 demand in setting of Hypoxia.  TTE Non acute without any WMA, EF 60%.  History of stroke with right-sided residual weakness: Can resume home p.o. meds-statin, plavix.  Hypertension: Stable, Continue home meds amlodipine and metoprolol.  BPH: Continue  Proscar.  Thrombocytopenia: -improved.   Prediabetes  Steroid Induced Hyperglycemia -  A1c 6.2, basal/bolus, adjust as needed.  With weaning of steroids Lantus dose reduced.  CBG (last 3)  Recent Labs    11/12/19 1740 11/12/19 2108 11/13/19 0611  GLUCAP 111* 236* 74       DVT prophylaxis: heparin Code Status: full  Family Communication:   Dayna Ramus [phone] - 11/10/19 at 10:12 AM, message left, updated 11/11/19  son Francee Piccolo on [phone] on 11/10/2019 at 10:11 AM, message left.   Disposition:   Status is: Inpatient  Remains inpatient appropriate because:Inpatient level of care appropriate due to severity of illness   Dispo: The patient is from: Home              Anticipated d/c is to: pending              Anticipated d/c date is: > 3 days              Patient currently is not medically stable to d/c.   Consultants:   neurology  Procedures:   VQ - Buckley probability  CTA -   1. No central or segmental pulmonary embolus. Unable to evaluate at the subsegmental level. 2. Right lung interstitial opacities may be partially due to the patient's right lateral decubitus positioning; however, overlying infection cannot be excluded. Left hilar lymph node likely reactive. 3. 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  Leg [place] - No DVT   Echo  1. Left ventricular ejection fraction, by estimation, is 60 to 65%. The left ventricle has normal function. The left ventricle has no regional wall motion abnormalities. Left ventricular diastolic parameters are consistent with Grade I diastolic  dysfunction (impaired relaxation).  2. Right ventricular systolic function was not well visualized. The right ventricular size is not well visualized.  3. The mitral valve was not well visualized. No evidence of mitral valve regurgitation.  4. The aortic valve was not well visualized. Aortic valve regurgitation is not visualized.    Antimicrobials:    Anti-infectives (From admission, onward)   Start     Dose/Rate Route Frequency Ordered Stop   11/04/19 1600  cefTRIAXone (ROCEPHIN) 2 g in sodium chloride 0.9 % 100 mL IVPB        2 g 200 mL/hr over 30 Minutes Intravenous Every 24 hours 11/04/19 1445 11/09/19 0600   11/04/19 1600  azithromycin (ZITHROMAX) tablet 500 mg        500 mg Oral Daily 11/04/19 1445 11/08/19 0942   2019-11-04 1830  cefTRIAXone (ROCEPHIN) 1 g in sodium chloride 0.9 % 100 mL IVPB        1 g 200 mL/hr over 30 Minutes Intravenous  Once 2019-11-04 1821 November 04, 2019 1937   11/04/19 1830  azithromycin (ZITHROMAX) 500 mg in sodium chloride 0.9 % 250 mL IVPB  500 mg 250 mL/hr over 60 Minutes Intravenous  Once 10/24/2019 1821 11/08/2019 2221       Objective: Vitals:   11/12/19 2113 11/12/19 2327 11/13/19 0412 11/13/19 0801  BP: 118/68 117/61 120/73 128/77  Pulse: 81 64 73 63  Resp:  18 20 20   Temp:  98.1 F (36.7 C) 97.7 F (36.5 C) 98.9 F (37.2 C)  TempSrc:  Oral Oral Oral  SpO2:  93% 94% 96%  Weight:      Height:        Intake/Output Summary (Last 24 hours) at 11/13/2019 0957 Last data filed at 11/13/2019 0413 Gross per 24 hour  Intake 120 ml  Output 1600 ml  Net -1480 ml   Filed Weights   11/04/19 0305  Weight: 79 kg    Examination:  Awake Alert, chronic right-sided weakness from previous stroke, no new F.N deficits, Normal affect [place].AT,PERRAL Supple Neck,No JVD, No cervical lymphadenopathy appriciated.  Symmetrical Chest wall movement, Good air movement bilaterally, CTAB RRR,No Gallops, Rubs or new Murmurs, No Parasternal Heave +ve B.Sounds, Abd Soft, No tenderness, No organomegaly appriciated, No rebound - guarding or rigidity. No Cyanosis, Clubbing or edema, No new Rash or bruise      Data Reviewed: I have personally reviewed following labs and imaging studies  CBC: Recent Labs  Lab 11/08/19 0302 11/08/19 0302 11/09/19 0653 11/09/19 1115 11/10/19 0616 11/11/19 0843 11/12/19 0959   WBC 10.4  --  17.1*  --  14.5* 15.2* 11.5*  NEUTROABS 9.6*  --  15.8*  --  13.1* 13.3* 9.7*  HGB 13.2   < > 13.6 14.0 13.6 15.9 15.1  HCT 38.6*   < > 38.9* 40.8 39.8 46.1 43.2  MCV 89.8  --  90.9  --  89.8 90.4 90.4  PLT 162  --  190  --  192 234 160   < > = values in this interval not displayed.    Basic Metabolic Panel: Recent Labs  Lab 11/07/19 0723 11/07/19 0723 11/08/19 0302 11/09/19 0653 11/10/19 0616 11/11/19 0843 11/12/19 0959  NA 141   < > 140 139 139 139 143  K 4.9   < > 4.5 5.0 5.1 5.0 3.7  CL 110   < > 109 110 108 102 107  CO2 19*   < > 22 23 23 25 26   GLUCOSE 163*   < > 186* 201* 153* 168* 105*  BUN 41*   < > 44* 46* 40* 34* 35*  CREATININE 1.62*   < > 1.90* 1.74* 1.81* 1.80* 1.74*  CALCIUM 8.0*   < > 7.7* 7.7* 7.9* 8.4* 7.9*  MG 2.5*   < > 2.3 2.3 2.3 2.5* 2.4  PHOS 3.8  --  3.3 3.7  --   --   --    < > = values in this interval not displayed.    GFR: Estimated Creatinine Clearance: 35.4 mL/min (A) (by C-G formula based on SCr of 1.74 mg/dL (H)).  Liver Function Tests: Recent Labs  Lab 11/08/19 0302 11/09/19 0653 11/10/19 0616 11/11/19 0843 11/12/19 0959  AST 40 31 21 24 21   ALT 106* 107* 90* 84* 93*  ALKPHOS 59 58 62 66 58  BILITOT 0.7 0.5 0.9 1.4* 1.0  PROT 5.3* 5.0* 5.3* 6.2* 5.3*  ALBUMIN 2.0* 2.1* 2.3* 2.7* 2.5*    CBG: Recent Labs  Lab 11/12/19 0617 11/12/19 1223 11/12/19 1740 11/12/19 2108 11/13/19 0611  GLUCAP 168* 162* 111* 236* 74     Recent Results (  from the past 240 hour(s))  MRSA PCR Screening     Status: None   Collection Time: 11/04/19  7:05 PM   Specimen: Nasopharyngeal  Result Value Ref Range Status   MRSA by PCR NEGATIVE NEGATIVE Final    Comment:        The GeneXpert MRSA Assay (FDA approved for NASAL specimens only), is one component of a comprehensive MRSA colonization surveillance program. It is not intended to diagnose MRSA infection nor to guide or monitor treatment for MRSA infections. Performed at  [facility], [address] [place], [place] [address]       Radiology Studies: No results found.  Scheduled Meds: . amLODipine  10 mg Oral Daily  . vitamin C  500 mg Oral Daily  . atorvastatin  20 mg Oral q1800  . clopidogrel  75 mg Oral Daily  . finasteride  5 mg Oral Daily  . heparin injection (subcutaneous)  5,000 Units Subcutaneous Q8H  . insulin aspart  0-15 Units Subcutaneous TID WC  . insulin aspart  0-5 Units Subcutaneous QHS  . insulin glargine  15 Units Subcutaneous Daily  . metoprolol tartrate  12.5 mg Oral BID  . pantoprazole  40 mg Oral BID AC  . polyethylene glycol  17 g Oral BID  . zinc sulfate  220 mg Oral Daily   Continuous Infusions:    LOS: 11 days    Time spent: over 30 min  Signature  Susa RaringPrashant Elleanor Guyett M.D on 11/13/2019 at 9:57 AM   -  To page go to [email or web address]

## 2019-11-14 ENCOUNTER — Inpatient Hospital Stay (HOSPITAL_COMMUNITY): Payer: Medicare HMO

## 2019-11-14 DIAGNOSIS — G9341 Metabolic encephalopathy: Secondary | ICD-10-CM

## 2019-11-14 DIAGNOSIS — J9601 Acute respiratory failure with hypoxia: Secondary | ICD-10-CM

## 2019-11-14 DIAGNOSIS — I619 Nontraumatic intracerebral hemorrhage, unspecified: Secondary | ICD-10-CM

## 2019-11-14 LAB — GLUCOSE, CAPILLARY
Glucose-Capillary: 207 mg/dL — ABNORMAL HIGH (ref 70–99)
Glucose-Capillary: 220 mg/dL — ABNORMAL HIGH (ref 70–99)
Glucose-Capillary: 211 mg/dL — ABNORMAL HIGH (ref 70–99)
Glucose-Capillary: 271 mg/dL — ABNORMAL HIGH (ref 70–99)
Glucose-Capillary: 156 mg/dL — ABNORMAL HIGH (ref 70–99)
Glucose-Capillary: 147 mg/dL — ABNORMAL HIGH (ref 70–99)

## 2019-11-14 LAB — POCT I-STAT 7, (LYTES, BLD GAS, ICA,H+H)
Acid-Base Excess: 3 mmol/L — ABNORMAL HIGH (ref 0.0–2.0)
Bicarbonate: 26.7 mmol/L (ref 20.0–28.0)
Calcium, Ion: 1.12 mmol/L — ABNORMAL LOW (ref 1.15–1.40)
HCT: 44 % (ref 39.0–52.0)
Hemoglobin: 15 g/dL (ref 13.0–17.0)
O2 Saturation: 100 %
Patient temperature: 98.2
Potassium: 4.1 mmol/L (ref 3.5–5.1)
Sodium: 140 mmol/L (ref 135–145)
TCO2: 28 mmol/L (ref 22–32)
pCO2 arterial: 37.9 mmHg (ref 32.0–48.0)
pH, Arterial: 7.455 — ABNORMAL HIGH (ref 7.350–7.450)
pO2, Arterial: 271 mmHg — ABNORMAL HIGH (ref 83.0–108.0)

## 2019-11-14 LAB — CBC WITH DIFFERENTIAL/PLATELET
Abs Immature Granulocytes: 0.46 10*3/uL — ABNORMAL HIGH (ref 0.00–0.07)
Basophils Absolute: 0.1 10*3/uL (ref 0.0–0.1)
Basophils Relative: 0 %
Eosinophils Absolute: 0 10*3/uL (ref 0.0–0.5)
Eosinophils Relative: 0 %
HCT: 44.6 % (ref 39.0–52.0)
Hemoglobin: 15.7 g/dL (ref 13.0–17.0)
Immature Granulocytes: 3 %
Lymphocytes Relative: 4 %
Lymphs Abs: 0.7 10*3/uL (ref 0.7–4.0)
MCH: 32.3 pg (ref 26.0–34.0)
MCHC: 35.2 g/dL (ref 30.0–36.0)
MCV: 91.8 fL (ref 80.0–100.0)
Monocytes Absolute: 0.6 10*3/uL (ref 0.1–1.0)
Monocytes Relative: 4 %
Neutro Abs: 14.2 10*3/uL — ABNORMAL HIGH (ref 1.7–7.7)
Neutrophils Relative %: 89 %
Platelets: 236 10*3/uL (ref 150–400)
RBC: 4.86 MIL/uL (ref 4.22–5.81)
RDW: 15.2 % (ref 11.5–15.5)
WBC: 16.1 10*3/uL — ABNORMAL HIGH (ref 4.0–10.5)
nRBC: 0 % (ref 0.0–0.2)

## 2019-11-14 LAB — TRIGLYCERIDES: Triglycerides: 129 mg/dL (ref <150)

## 2019-11-14 LAB — BRAIN NATRIURETIC PEPTIDE: B Natriuretic Peptide: 24.7 pg/mL (ref 0.0–100.0)

## 2019-11-14 LAB — COMPREHENSIVE METABOLIC PANEL
ALT: 70 U/L — ABNORMAL HIGH (ref 0–44)
AST: 24 U/L (ref 15–41)
Albumin: 2.7 g/dL — ABNORMAL LOW (ref 3.5–5.0)
Alkaline Phosphatase: 60 U/L (ref 38–126)
Anion gap: 11 (ref 5–15)
BUN: 27 mg/dL — ABNORMAL HIGH (ref 8–23)
CO2: 21 mmol/L — ABNORMAL LOW (ref 22–32)
Calcium: 7.9 mg/dL — ABNORMAL LOW (ref 8.9–10.3)
Chloride: 106 mmol/L (ref 98–111)
Creatinine, Ser: 1.8 mg/dL — ABNORMAL HIGH (ref 0.61–1.24)
GFR calc Af Amer: 42 mL/min — ABNORMAL LOW (ref >60)
GFR calc non Af Amer: 37 mL/min — ABNORMAL LOW (ref >60)
Glucose, Bld: 202 mg/dL — ABNORMAL HIGH (ref 70–99)
Potassium: 4.2 mmol/L (ref 3.5–5.1)
Sodium: 138 mmol/L (ref 135–145)
Total Bilirubin: 1.3 mg/dL — ABNORMAL HIGH (ref 0.3–1.2)
Total Protein: 5.5 g/dL — ABNORMAL LOW (ref 6.5–8.1)

## 2019-11-14 LAB — PROCALCITONIN: Procalcitonin: <0.1 ng/mL

## 2019-11-14 LAB — MAGNESIUM: Magnesium: 2 mg/dL (ref 1.7–2.4)

## 2019-11-14 LAB — C-REACTIVE PROTEIN: CRP: 0.5 mg/dL (ref <1.0)

## 2019-11-14 MED ORDER — FINASTERIDE 5 MG PO TABS
5.0000 mg | ORAL_TABLET | Freq: Every day | ORAL | Status: DC
  Filled 2019-11-14 (×3): qty 1

## 2019-11-14 MED ORDER — ACETAMINOPHEN 160 MG/5ML PO SOLN: 650.0000 mg | ORAL | Status: DC | PRN

## 2019-11-14 MED ORDER — INSULIN ASPART 100 UNIT/ML ~~LOC~~ SOLN
0.0000 [IU] | SUBCUTANEOUS | Status: DC
  Administered 2019-11-14: 2 [IU] via SUBCUTANEOUS
  Administered 2019-11-14 (×2): 5 [IU] via SUBCUTANEOUS
  Administered 2019-11-14: 3 [IU] via SUBCUTANEOUS
  Administered 2019-11-14: 5 [IU] via SUBCUTANEOUS
  Administered 2019-11-14: 3 [IU] via SUBCUTANEOUS
  Administered 2019-11-15: 2 [IU] via SUBCUTANEOUS
  Administered 2019-11-15: 3 [IU] via SUBCUTANEOUS
  Administered 2019-11-15 – 2019-11-16 (×7): 2 [IU] via SUBCUTANEOUS

## 2019-11-14 MED ORDER — HYDRALAZINE HCL 20 MG/ML IJ SOLN: 10.0000 mg | INTRAMUSCULAR | Status: DC | PRN

## 2019-11-14 MED ORDER — DOCUSATE SODIUM 50 MG/5ML PO LIQD: 100.0000 mg | Freq: Two times a day (BID) | ORAL | Status: DC

## 2019-11-14 MED ORDER — FENTANYL CITRATE (PF) 100 MCG/2ML IJ SOLN: 25.0000 ug | INTRAMUSCULAR | Status: DC | PRN

## 2019-11-14 MED ORDER — GUAIFENESIN-DM 100-10 MG/5ML PO SYRP
10.0000 mL | ORAL_SOLUTION | ORAL | Status: DC | PRN
  Filled 2019-11-14: qty 10

## 2019-11-14 MED ORDER — POLYETHYLENE GLYCOL 3350 17 G PO PACK: 17.0000 g | PACK | Freq: Two times a day (BID) | ORAL | Status: DC

## 2019-11-14 MED ORDER — CHLORHEXIDINE GLUCONATE 0.12% ORAL RINSE (MEDLINE KIT)
15.0000 mL | Freq: Two times a day (BID) | OROMUCOSAL | Status: DC
  Administered 2019-11-14 – 2019-11-16 (×5): 15 mL via OROMUCOSAL

## 2019-11-14 MED ORDER — ORAL CARE MOUTH RINSE
15.0000 mL | OROMUCOSAL | Status: DC
  Administered 2019-11-14 – 2019-11-16 (×27): 15 mL via OROMUCOSAL

## 2019-11-14 MED ORDER — METOPROLOL TARTRATE 12.5 MG HALF TABLET: 12.5000 mg | ORAL_TABLET | Freq: Two times a day (BID) | ORAL | Status: DC

## 2019-11-14 MED ORDER — SODIUM CHLORIDE 0.9 % IV BOLUS
500.0000 mL | Freq: Once | INTRAVENOUS | Status: AC
  Administered 2019-11-14: 500 mL via INTRAVENOUS

## 2019-11-14 MED ORDER — HYDRALAZINE HCL 20 MG/ML IJ SOLN
INTRAMUSCULAR | Status: AC
  Administered 2019-11-14: 10 mg via INTRAVENOUS
  Filled 2019-11-14: qty 1

## 2019-11-14 MED ORDER — CLOPIDOGREL BISULFATE 75 MG PO TABS: 75.0000 mg | ORAL_TABLET | Freq: Every day | ORAL | Status: DC

## 2019-11-14 MED ORDER — ATORVASTATIN CALCIUM 10 MG PO TABS: 20.0000 mg | ORAL_TABLET | Freq: Every day | ORAL | Status: DC

## 2019-11-14 MED ORDER — ZINC SULFATE 220 (50 ZN) MG PO CAPS: 220.0000 mg | ORAL_CAPSULE | Freq: Every day | ORAL | Status: DC

## 2019-11-14 MED ORDER — AMLODIPINE BESYLATE 10 MG PO TABS: 10.0000 mg | ORAL_TABLET | Freq: Every day | ORAL | Status: DC

## 2019-11-14 MED ORDER — ASCORBIC ACID 500 MG PO TABS: 500.0000 mg | ORAL_TABLET | Freq: Every day | ORAL | Status: DC

## 2019-11-14 MED ORDER — PANTOPRAZOLE SODIUM 40 MG PO PACK
40.0000 mg | PACK | Freq: Two times a day (BID) | ORAL | Status: DC
  Filled 2019-11-14 (×3): qty 20

## 2019-11-14 MED ORDER — CLEVIDIPINE BUTYRATE 0.5 MG/ML IV EMUL
0.0000 mg/h | INTRAVENOUS | Status: DC
  Administered 2019-11-14: 1 mg/h via INTRAVENOUS
  Administered 2019-11-14: 5 mg/h via INTRAVENOUS
  Filled 2019-11-14 (×3): qty 50

## 2019-11-14 MED ORDER — DOPAMINE-DEXTROSE 3.2-5 MG/ML-% IV SOLN
0.0000 ug/kg/min | INTRAVENOUS | Status: DC
  Administered 2019-11-14: 5 ug/kg/min via INTRAVENOUS
  Filled 2019-11-14: qty 250

## 2019-11-14 MED ORDER — CHLORHEXIDINE GLUCONATE CLOTH 2 % EX PADS
6.0000 | MEDICATED_PAD | Freq: Every day | CUTANEOUS | Status: DC
  Administered 2019-11-14 – 2019-11-16 (×3): 6 via TOPICAL

## 2019-11-14 MED ORDER — NOREPINEPHRINE 4 MG/250ML-% IV SOLN
2.0000 ug/min | INTRAVENOUS | Status: DC
  Administered 2019-11-14: 16 ug/min via INTRAVENOUS
  Administered 2019-11-14: 2 ug/min via INTRAVENOUS
  Administered 2019-11-15: 20 ug/min via INTRAVENOUS
  Filled 2019-11-14 (×3): qty 250

## 2019-11-14 MED ORDER — PROPOFOL 1000 MG/100ML IV EMUL
5.0000 ug/kg/min | INTRAVENOUS | Status: DC
  Administered 2019-11-14: 25 ug/kg/min via INTRAVENOUS
  Administered 2019-11-14: 20 ug/kg/min via INTRAVENOUS
  Filled 2019-11-14: qty 100

## 2019-11-14 NOTE — Progress Notes (Signed)
EEG complete - results pending 

## 2019-11-14 NOTE — Progress Notes (Signed)
Due to episodes of emesis this morning, pt is strict NPO including medications per Dr. Merrily Pew

## 2019-11-14 NOTE — Progress Notes (Signed)
PCCM progress note  While present on unit for emergent consultation regarding another patient.  This patient's chart was reviewed and revealed head CT scan had been completed.  I personally reviewed imaging prior to interventional radiology read and a large intracranial hemorrhage i was dentified.  At that time the  [facility] provider was notified and contacted neurosurgery for ground team. In the meantime I was able to contact patient's son Robert Buckley regarding update on CT scan findings.  Additional family members were present on the phone call as well including patient's brother and aunt.  All were updated regarding very large nonsurvivable intracranial hemorrhage.  Family is shocked and understandably upset regarding drastic critical change and poor prognosis.  Family is not ready to pursue any comfort/palliative measures at this time.  We will continue to support patient with aggressive measures but progression to brain death and/or possible cardiac arrest are inevitable.    [name], NP-C [facility] Contact / Pager information can be found on Amion  11/14/2019, 3:57 AM

## 2019-11-14 NOTE — Progress Notes (Signed)
   11/14/19 0455  Clinical Encounter Type  Visited With Patient and family together  Visit Type Follow-up  Referral From Nurse  Consult/Referral To Chaplain   Chaplain assisted Pt's son, Tasia Catchings, upon arrival. Chaplain provided support through presence until no longer needed. Chaplain remains available as needed.  This note was prepared by Chaplain Resident, Tacy Learn, MDiv. Chaplain remains available as needed through the on-call pager: [phone].

## 2019-11-14 NOTE — Progress Notes (Signed)
Pt transported to ct and back with no complications. 

## 2019-11-14 NOTE — Significant Event (Addendum)
Rapid Response Event Note   Reason for Call :  Unresponsiveness. Per RN, pt was at baseline mental status at 2200.  Initial Focused Assessment:  Pt laying in bed with eyes closed, with snoring respirations. Pt posturing R arm only to painful stimuli. Pupils 6, round, and unreactive. Pt has no gag reflex. Skin warm and dry. T-98.2, BP-196/91, HR-fluctuating between 60s and 110s, RR-24, SpO2-93% on HFNC 13L, CBG-207. After repeated physical stimulation, pt able to move all extremities to painful stimulation.   Dr. Carles Collet) notified-came to bedside. Dr. Nicole Cella (PCCM) notified-came to bedside.  Once MDs at beside, twitching noted to R foot.    Interventions:  CBG-207 PCCM consulted Pt moved to 3M08 and intubated  Plan of Care:  Pt moved to 3M08   Event Summary:   MD Notified: Dr. Rachael Darby Call [id number] Arrival [phone] End Time:0040  Terrilyn Saver, RN

## 2019-11-14 NOTE — Progress Notes (Signed)
eLink Physician-Brief Progress Note Patient Name: Caidon Foti DOB: 1945-05-11 MRN: [id number]   Date of Service  11/14/2019  HPI/Events of Note  70M who was transferred to the ICU after a rapid response was called for acute onset of obtundation. He was intubated during the rapid response for inability to protect his airway.  Originally admitted 9/21 for AMS and weakness, found to be COVID positive. 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 Was evaluated for stroke on admission but had an MRI/MRA Brain that was negative for acute findings.  After intubation in the ICU the patient was sent for a STAT NCHCT which showed a devastating ICH with extension into intraventricular space. Neurosurgery was consulted and confirmed that this was a non-survivable hemorrhage.   eICU Interventions  Family is being updated presently about the patient's catastrophic ICH/IVH. This is a non-survivable intracranial hemorrhage. Transition to comfort focused care to be recommended.      Intervention Category Evaluation Type: New Patient Evaluation  Janae Bridgeman 11/14/2019, 12:47 AM

## 2019-11-14 NOTE — Progress Notes (Addendum)
eLink Physician-Brief Progress Note Patient Name: Codylee Patil DOB: 1945/05/27 MRN: [id number]   Date of Service  11/14/2019  HPI/Events of Note  Hypothermia  eICU Interventions  Warming blanket requested, ordered      Intervention Category Major Interventions: Other:  Oretha Milch 11/14/2019, 9:09 PM   Addendum Hypotension HR in 60s On levophed at 10 mic/min through peripheral IV Bolus 500 cc nS Increase levophed for now to 14 mic/min Very poor overall prognosis Also notified bedside PCCM team, may need to consider a central line if pressor needs increase   11:15 pm Repeat hypotension Increasing levophed - can increase to 20 mic/min if needed Has another peripheral IV line, start dopamine Patient likely with herniation and likely will have a cardiac arrest, family is aware of his condition per notes Bedside PCCM also aware of his condition  Do not think medical management will change his outcome   11:30 pm Called and spoke with patient;s sons , Tasia Catchings and Francee Piccolo Discussed overall condition Very concerned patient will have a cardiac arrest tonight and could be anytime soon with dropping BP / HR They understand and I discussed DNR, comfort measures with them as well They would like to discuss amongst themselves and call back I told them that patient is already on maximal therapy and doing chest compressions / defibrillation would not change outcome

## 2019-11-14 NOTE — Consult Note (Signed)
NAME:  Robert Buckley, MRN:  [id number], DOB:  08/15/1945, LOS: 12 ADMISSION DATE:  10/14/2019, CONSULTATION DATE: 11/14/2019 REFERRING MD: Dr. Thedore MinsSingh, CHIEF COMPLAINT: Unresponsive  Brief History   74 year old male presented with acute onset generalized weakness and altered mental status with confusion and difficulty to ambulate.  Code stroke was called and patient was found also to be Covid positive, patient was admitted to hospitalist services for further management and work-up.  PCCM consulted 10/3 for acute unresponsive episode with concern for seizures  History of present illness   Robert JewsCharles Hlavac is a 74 year old male with past medical history significant for prior stroke in 2008 with residual right-sided weakness, hypertension, stage III CKD, and osteoarthritis who presented to the emergency department with acute onset generalized weakness, altered mental status with confusion, and difficulty ambulating.  Patient was also seen with mild slurred speech and dry cough.  Initially denied any fever, chills, dyspnea, wheezing, loss of taste or smell, nausea vomiting or diarrhea.  Patient had not been vaccinated against COVID-19 but recently exposed to son who was recently diagnosed Covid positive day before patient's admission.  Patient was treated for code stroke and initial head CT had a concern for periorbital parenchymal hemorrhage and patient was admitted to neurology services.  MRI brain showed no acute intracranial hemorrhage or acute abnormalities symptoms thought secondary to recrudescence of prior stroke.  Since arrival patient has remained hospitalized for treatment of COVID-19.  At approximately 22 10/2 patient became unresponsive and rapid response was called.  Per rapid response report patient was found unresponsive with posturing of right upper extremity to painful stimuli.  Negative for gag or cough with suctioning.  Given concern for protection of airway in the setting of possible  stroke versus more likely seizure-like activities critical care was called.  Patient was transferred to 3M08 where he was intubated for airway protection.  Further work-up pending.  Past Medical History  Stroke 2008 with right-sided weakness Hypertension Stage III CKD Osteoarthritis  Significant Hospital Events   Admitted 10/21  Consults:  PCCM Neurology  Procedures:    Significant Diagnostic Tests:  MRI brain/MRI angio head 9/21 > 1. No acute intracranial abnormality. 2. Moderately advanced age-related cerebral atrophy with chronic microvascular ischemic disease, with a few scattered remote lacunar infarcts about the bilateral basal ganglia, thalami, and hemispheric cerebral white matter. 3. Small focus of susceptibility artifact at the left thalamus, corresponding with hyperdensity seen on prior CT. This is most consistent with a small focus of mineralization/calcification. No acute intracranial hemorrhage. 4. Acute ethmoidal and maxillary sinusitis, right worse than left.  MRA head 1921 Negative intracranial MRA  MRA NECK 9/21: Negative MRA of the neck.   CT angio chest 9/23 >  1. No central or segmental pulmonary embolus. Unable to evaluate at the subsegmental level. 2. Right lung interstitial opacities may be partially due to the patient's right lateral decubitus positioning; however, overlying infection cannot be excluded. Left hilar lymph node likely reactive. 3. 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  VQ scan 9/27 > The overall constellation of findings suggest a relatively low probability for pulmonary embolus  Micro Data:  MRSA PCR 9/23 > negative Blood cultures 9/21 > negative  Antimicrobials:     Interim history/subjective:  Unresponsive  Objective   Blood pressure (!) 152/109, pulse 100, temperature 98.2 F (36.8 C), temperature source Oral, resp. rate 19, height 5\' 7"  (1.702 m), weight 79 kg, SpO2 98 %.    Vent Mode:  PRVC FiO2 (%):  [  100 %] 100 % Set Rate:  [18 bmp] 18 bmp Vt Set:  [520 mL] 520 mL PEEP:  [5 cmH20] 5 cmH20   Intake/Output Summary (Last 24 hours) at 11/14/2019 0038 Last data filed at 11/13/2019 1712 Gross per 24 hour  Intake 464 ml  Output 1300 ml  Net -836 ml   Filed Weights   11/04/19 0305  Weight: 79 kg    Examination: General: Chronically ill appearing elderly male lying in bed unresponsive  HEENT: ETT, MM pink/moist, PERRL,  Neuro: Unresponsive CV: s1s2 regular rate and rhythm, no murmur, rubs, or gallops,  PULM: Clear to auscultation bilaterally, no increased work of breathing, no added breath sounds GI: soft, bowel sounds active in all 4 quadrants, non-tender, non-distended Extremities: warm/dry, no edema  Skin: no rashes or lesions  Resolved Hospital Problem list   Elevated D-dimer -Negative VQ scan and lower extremity Dopplers Concern for blood in stool -Hemoglobin remained stable with no repeat episodes likely due to constipation, evaluated by GI this admission Thrombocytopenia  Assessment & Plan:  Seizure-like activity -Patient was seen with acute change in mentation at approximately 2200 on 10/2.  Also reported episode of bradycardia and tremor to right lower extremity.  All concerning for acute seizure-like activity. History of stroke with right-sided residual weakness P: Transfer to ICU Intubated for airway protection, see below Stat EEG Neurology consultation pending EEG Obtain noncontrasted head CT Frequent neurochecks Minimize sedation as able Seizure precautions Continue secondary prevention for stroke including Plavix and Lipitor  Acute respiratory insufficiency -Given acute concern for seizure-like activity versus less likely possible stroke patient developed unresponsiveness and acute inability to protect airway.  On original assessment patient had no gag reflex and snoring respirations heard P: Transfer to ICU and intubated for airway  protection Continue ventilator support with lung protective strategies  Wean PEEP and FiO2 for sats greater than 90%. Head of bed elevated 30 degrees. Plateau pressures less than 30 cm H20.  Follow intermittent chest x-ray and ABG.   SAT/SBT as tolerated, mentation preclude extubation  Ensure adequate pulmonary hygiene  Follow cultures  VAP bundle in place  PAD protocol  AKI superimposed on CKD stage III -Appears baseline creatinine ranging 1.5-1.7, creatinine 2.07 on admission P: Follow renal function / urine output Trend Bmet Avoid nephrotoxins Ensure adequate renal perfusion   Hypertension -Home medication includes Norvasc, Proscar, and Metroprolol P: As needed IV antihypertensives Continue oral Norvasc and metoprolol per NG tube  Prediabetes Steroid-induced hyperglycemia P: Continue SSI CBG every 4  Best practice:  Diet: N.p.o. consider initiation of tube feeds in a.m. Pain/Anxiety/Delirium protocol (if indicated): Propofol drip VAP protocol (if indicated): In place DVT prophylaxis: Heparin GI prophylaxis: PPI Glucose control: SSI Mobility: Bedrest Code Status: Full Family Communication: We update Disposition: ICU  Labs   CBC: Recent Labs  Lab 11/08/19 0302 11/08/19 0302 11/09/19 0653 11/09/19 1115 11/10/19 0616 11/11/19 0843 11/12/19 0959  WBC 10.4  --  17.1*  --  14.5* 15.2* 11.5*  NEUTROABS 9.6*  --  15.8*  --  13.1* 13.3* 9.7*  HGB 13.2   < > 13.6 14.0 13.6 15.9 15.1  HCT 38.6*   < > 38.9* 40.8 39.8 46.1 43.2  MCV 89.8  --  90.9  --  89.8 90.4 90.4  PLT 162  --  190  --  192 234 160   < > = values in this interval not displayed.    Basic Metabolic Panel: Recent Labs  Lab 11/07/19 0723 11/07/19 [phone]  11/08/19 0302 11/09/19 6761 11/10/19 0616 11/11/19 0843 11/12/19 0959  NA 141   < > 140 139 139 139 143  K 4.9   < > 4.5 5.0 5.1 5.0 3.7  CL 110   < > 109 110 108 102 107  CO2 19*   < > 22 23 23 25 26   GLUCOSE 163*   < > 186* 201* 153*  168* 105*  BUN 41*   < > 44* 46* 40* 34* 35*  CREATININE 1.62*   < > 1.90* 1.74* 1.81* 1.80* 1.74*  CALCIUM 8.0*   < > 7.7* 7.7* 7.9* 8.4* 7.9*  MG 2.5*   < > 2.3 2.3 2.3 2.5* 2.4  PHOS 3.8  --  3.3 3.7  --   --   --    < > = values in this interval not displayed.   GFR: Estimated Creatinine Clearance: 35.4 mL/min (A) (by C-G formula based on SCr of 1.74 mg/dL (H)). Recent Labs  Lab 11/09/19 0653 11/10/19 0616 11/11/19 0843 11/12/19 0959  PROCALCITON  --  <0.10 <0.10 <0.10  WBC 17.1* 14.5* 15.2* 11.5*    Liver Function Tests: Recent Labs  Lab 11/08/19 0302 11/09/19 0653 11/10/19 0616 11/11/19 0843 11/12/19 0959  AST 40 31 21 24 21   ALT 106* 107* 90* 84* 93*  ALKPHOS 59 58 62 66 58  BILITOT 0.7 0.5 0.9 1.4* 1.0  PROT 5.3* 5.0* 5.3* 6.2* 5.3*  ALBUMIN 2.0* 2.1* 2.3* 2.7* 2.5*   No results for input(s): LIPASE, AMYLASE in the last 168 hours. No results for input(s): AMMONIA in the last 168 hours.  ABG    Component Value Date/Time   TCO2 23 10/20/2019 1724     Coagulation Profile: No results for input(s): INR, PROTIME in the last 168 hours.  Cardiac Enzymes: No results for input(s): CKTOTAL, CKMB, CKMBINDEX, TROPONINI in the last 168 hours.  HbA1C: Hgb A1c MFr Bld  Date/Time Value Ref Range Status  11/04/2019 03:50 PM 6.2 (H) 4.8 - 5.6 % Final    Comment:    REPEATED TO VERIFY (NOTE) Pre diabetes:          5.7%-6.4%  Diabetes:              >6.4%  Glycemic control for   <7.0% adults with diabetes   09/30/2016 04:59 AM 5.6 4.8 - 5.6 % Final    Comment:    (NOTE)         Prediabetes: 5.7 - 6.4         Diabetes: >6.4         Glycemic control for adults with diabetes: <7.0     CBG: Recent Labs  Lab 11/13/19 0611 11/13/19 1233 11/13/19 1709 11/13/19 2202 11/13/19 2329  GLUCAP 74 125* 122* 163* 207*    Review of Systems:   Unable to gather due to unresponsiveness  Past Medical History  He,  has a past medical history of Arthritis,  Hypertension, and Stroke (HCC) (2008).   Surgical History   History reviewed. No pertinent surgical history.   Social History   reports that he has never smoked. He has never used smokeless tobacco. He reports that he does not drink alcohol and does not use drugs.   Family History   His family history includes Diabetes in his mother.   Allergies Allergies  Allergen Reactions  . Sulfa Antibiotics Rash     Home Medications  Prior to Admission medications   Medication Sig Start Date End Date  Taking? Authorizing Provider  amLODipine (NORVASC) 10 MG tablet Take 10 mg by mouth daily. 10/24/19  Yes [provider]  ascorbic acid (VITAMIN C) 500 MG tablet Take 1 tablet (500 mg total) by mouth daily. 10/27/19 11/26/19 Yes Sreenath, Sudheer B, MD  atorvastatin (LIPITOR) 20 MG tablet Take 20 mg by mouth daily. 07/29/19  Yes [provider]  clopidogrel (PLAVIX) 75 MG tablet Take 1 tablet (75 mg total) by mouth daily. 10/16/16  Yes Angiulli, Mcarthur Rossetti, PA-C  finasteride (PROSCAR) 5 MG tablet Take 1 tablet (5 mg total) by mouth daily. 10/16/16  Yes Angiulli, Mcarthur Rossetti, PA-C  metoprolol succinate (TOPROL-XL) 50 MG 24 hr tablet Take 1 tablet (50 mg total) by mouth daily. Take with or immediately following a meal. 10/16/16  Yes Angiulli, Mcarthur Rossetti, PA-C  Vitamin D3 (VITAMIN D) 25 MCG tablet Take 1 tablet (1,000 Units total) by mouth daily. 10/27/19 11/26/19 Yes Sreenath, Sudheer B, MD  zinc sulfate 220 (50 Zn) MG capsule Take 1 capsule (220 mg total) by mouth daily. 10/27/19 11/26/19 Yes Sreenath, Sudheer B, MD  albuterol (VENTOLIN HFA) 108 (90 Base) MCG/ACT inhaler Inhale 2 puffs into the lungs every 6 (six) hours as needed for wheezing or shortness of breath. Patient not taking: Reported on 11/03/2019 10/27/19   Tresa Moore, MD  benzonatate (TESSALON PERLES) 100 MG capsule Take 1 capsule (100 mg total) by mouth every 6 (six) hours as needed for cough. Patient not taking: Reported on  11/03/2019 10/27/19 10/26/20  Tresa Moore, MD     Critical care time:  CRITICAL CARE Performed by: [name]  Total critical care time: 50 minutes  Critical care time was exclusive of separately billable procedures and treating other patients.  Critical care was necessary to treat or prevent imminent or life-threatening deterioration.  Critical care was time spent personally by me on the following activities: development of treatment plan with patient and/or surrogate as well as nursing, discussions with consultants, evaluation of patient's response to treatment, examination of patient, obtaining history from patient or surrogate, ordering and performing treatments and interventions, ordering and review of laboratory studies, ordering and review of radiographic studies, pulse oximetry and re-evaluation of patient's condition.  [name], NP-C [facility] Contact / Pager information can be found on Amion  11/14/2019, 1:22 AM

## 2019-11-14 NOTE — Procedures (Signed)
Patient Name: Robert Buckley  MRN: [id number]  Epilepsy Attending: Charlsie Quest  Referring Physician/Provider: Dr Raymon Mutton, NP Date: 11/14/2019 Duration: 23.23 mins  Patient history: 74 year old male that presented on 11/17/2019 for acute onset generalized weakness with altered mental status.  He was found to be Covid positive and was being treated for Covid pneumonia.  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  Emergent consultation was obtained because the patient acutely became unresponsive tonight.  He no longer had gag or cough.  He did have tremulous movement with decortication of the right upper extremity and is tremulous movement of the right lower extremity. EEG to evaluate for seizure.  Level of alertness:  comatose  AEDs during EEG study: Propofol  Technical aspects: This EEG study was done with scalp electrodes positioned according to the 10-20 International system of electrode placement. Electrical activity was acquired at a sampling rate of 500Hz  and reviewed with a high frequency filter of 70Hz  and a low frequency filter of 1Hz . EEG data were recorded continuously and digitally stored.   Description: EEG showed continuous generalized background suppression. EEG was not reactive to tactile stimulation.   Hyperventilation and photic stimulation were not performed.     ABNORMALITY -Background suppression, generalized  IMPRESSION: This study is suggestive of profound diffuse encephalopathy, nonspecific etiology.  No seizures or epileptiform discharges were seen throughout the recording.   Lesette Frary 

## 2019-11-14 NOTE — Plan of Care (Signed)
  Problem: Activity: Goal: Risk for activity intolerance will decrease Outcome: Not Progressing Note: Pt is unresponsive. Unable to mobilize at this time.   Problem: Nutrition: Goal: Adequate nutrition will be maintained Outcome: Not Progressing Note: Pt is currently NPO and had a large emesis this morning. Will leave NPO per MD.

## 2019-11-14 NOTE — Progress Notes (Addendum)
Two adults sons were bedside and requested prayer.  Chaplain offered prayer, spiritual support and presence while MD gave very grim report.  Family is now asked to make difficult decisions about continued care.   Chaplain remains available for continued support as needed or requested.  Belia Heman, [place] [phone]  [phone] update: remaining son called for a final prayer with pt before leaving, accepted chaplain's offer to include anointing oil with prayer.   11/14/19 1620  Clinical Encounter Type  Visited With Patient and family together  Visit Type Initial;Critical Care  Referral From Family  Consult/Referral To Chaplain  Spiritual Encounters  Spiritual Needs Prayer

## 2019-11-14 NOTE — Progress Notes (Signed)
74 year old male here with left-sided basal ganglia and thalamic intraparenchymal hemorrhage with intraventricular extension.  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  Currently patient does not have pupillary or corneal reflexes but he has cough, gag and he is breathing over the vent.  EEG was done which ruled out active seizures  Currently patient's prognosis is very poor, considering his high ICH score and signs and symptoms of active herniation.  Family meeting was called, I spoke with patient's sons at bedside, explained patient condition that unfortunately his death is imminent.  They would like to keep patient full code and continue aggressive management.   Cheri Fowler MD Critical care physician [facility]  Pager: [phone] Mobile: [phone]

## 2019-11-14 NOTE — Progress Notes (Signed)
Pt was on her baseline until 2200 when he declined and became unresponsive after sometimes, Rapid RN called and on Call MD paged, patient was transferred to 3M08 for further treatment, Son, Pauline Trainer was notified of transfer to another floor for a different level of care.

## 2019-11-14 NOTE — Procedures (Signed)
Intubation Procedure Note  Steel Kerney  [id number]  [birth year]  Date:11/14/19  Time:4:18 AM   Provider Performing:Boston Catarino F Earlene Plater    Procedure: Intubation (31500)  Indication(s) Respiratory Failure  Consent Unable to obtain consent due to emergent nature of procedure.   Anesthesia None patient was obtunded    Time Out Verified patient identification, verified procedure, site/side was marked, verified correct patient position, special equipment/implants available, medications/allergies/relevant history reviewed, required imaging and test results available.   Sterile Technique Usual hand hygeine, masks, and gloves were used   Procedure Description Upon transfer from medical floor to intensive care unit patient positioned in bed supine.  No sedation was utilized as patient was currently obtunded.  Patient was intubated with endotracheal tube using 4 Glidescope.  View was Grade 1 full glottis Number of attempts was 1.  Colorimetric CO2 detector was consistent with tracheal placement.   Complications/Tolerance None; patient tolerated the procedure well. Chest X-ray is ordered to verify placement.     EBL None   Specimen(s) None  [name], NP-C [facility] Contact / Pager information can be found on Amion  11/14/2019, 4:19 AM

## 2019-11-14 NOTE — Consult Note (Signed)
  NEUROSURGERY CONSULT NOTE   Received call from Dr Benjamin Stain, PCCM, regarding patient. 74 year old male with history of CVA with residual right sided weakness, HTN, CKDIII who initially presented to ED 9/21 with generalized weakness, AMS and gait instability. He underwent stroke work up which was negative for acute findings but was noted to COVID 19 and has been hospitalized since for treatment. At approximately 2200 yesterday, patient was noted to be unresponsive with fixed and dilated pupils and extensor posturing RUE. Patient was immediately intubated and taken for a head CT. CT head revealed a massive left IPH  with intraventricular extension and severe hydrocephalus. NSY was called for further recommendations.  CT head reviewed. There is a massive left thalamic/ganglionic hemorrhage with intraventricular extension and resultant hydrocephalus. This is a catastrophic injury with no chance for meaningful recovery. I do not believe placing an EVD is of any benefit given magnitude of the hemorrhage as it will not alter the outcome. Rec transition to comfort care.  Cindra Presume, PA-C [facility]

## 2019-11-14 NOTE — Progress Notes (Addendum)
   11/14/19 0410  Clinical Encounter Type  Visited With Patient and family together  Visit Type Initial  Referral From Nurse  Consult/Referral To Chaplain  Spiritual Encounters  Spiritual Needs Prayer   Chaplain responded to page from Pt's nurse, Colin Mulders. Chaplain prayed with Pt's son Ladene Artist, at bedside. Chaplain let Pt's son and nurse know that chaplain remains available as needed.  This note was prepared by Chaplain Resident, Tacy Learn, MDiv. Chaplain remains available as needed through the on-call pager: [phone].

## 2019-11-14 NOTE — Progress Notes (Signed)
Et retracted 1.5 cm per md

## 2019-11-15 ENCOUNTER — Inpatient Hospital Stay (HOSPITAL_COMMUNITY): Payer: Medicare HMO

## 2019-11-15 DIAGNOSIS — J9601 Acute respiratory failure with hypoxia: Secondary | ICD-10-CM

## 2019-11-15 LAB — GLUCOSE, CAPILLARY
Glucose-Capillary: 127 mg/dL — ABNORMAL HIGH (ref 70–99)
Glucose-Capillary: 135 mg/dL — ABNORMAL HIGH (ref 70–99)
Glucose-Capillary: 135 mg/dL — ABNORMAL HIGH (ref 70–99)
Glucose-Capillary: 138 mg/dL — ABNORMAL HIGH (ref 70–99)
Glucose-Capillary: 141 mg/dL — ABNORMAL HIGH (ref 70–99)
Glucose-Capillary: 146 mg/dL — ABNORMAL HIGH (ref 70–99)
Glucose-Capillary: 172 mg/dL — ABNORMAL HIGH (ref 70–99)

## 2019-11-15 LAB — POCT I-STAT 7, (LYTES, BLD GAS, ICA,H+H)
Acid-base deficit: 2 mmol/L (ref 0.0–2.0)
Acid-base deficit: 2 mmol/L (ref 0.0–2.0)
Bicarbonate: 26.2 mmol/L (ref 20.0–28.0)
Bicarbonate: 24.3 mmol/L (ref 20.0–28.0)
Calcium, Ion: 1.11 mmol/L — ABNORMAL LOW (ref 1.15–1.40)
Calcium, Ion: 1.09 mmol/L — ABNORMAL LOW (ref 1.15–1.40)
HCT: 37 % — ABNORMAL LOW (ref 39.0–52.0)
HCT: 36 % — ABNORMAL LOW (ref 39.0–52.0)
Hemoglobin: 12.6 g/dL — ABNORMAL LOW (ref 13.0–17.0)
Hemoglobin: 12.2 g/dL — ABNORMAL LOW (ref 13.0–17.0)
O2 Saturation: 88 %
O2 Saturation: 91 %
Patient temperature: 98.2
Potassium: 6.5 mmol/L (ref 3.5–5.1)
Potassium: 6.7 mmol/L (ref 3.5–5.1)
Sodium: 143 mmol/L (ref 135–145)
Sodium: 141 mmol/L (ref 135–145)
TCO2: 28 mmol/L (ref 22–32)
TCO2: 26 mmol/L (ref 22–32)
pCO2 arterial: 61.9 mmHg — ABNORMAL HIGH (ref 32.0–48.0)
pCO2 arterial: 47.3 mmHg (ref 32.0–48.0)
pH, Arterial: 7.234 — ABNORMAL LOW (ref 7.350–7.450)
pH, Arterial: 7.318 — ABNORMAL LOW (ref 7.350–7.450)
pO2, Arterial: 65 mmHg — ABNORMAL LOW (ref 83.0–108.0)
pO2, Arterial: 68 mmHg — ABNORMAL LOW (ref 83.0–108.0)

## 2019-11-15 LAB — PROCALCITONIN: Procalcitonin: 26.37 ng/mL

## 2019-11-15 NOTE — Progress Notes (Signed)
2047 RN noted pt was hypotensive, hypothermic and reaching maximum limit on Levophed. MD notified verbal order to increase levophed to 67mcg/min. Orders followed per [facility].   2300- MD notified of pt still hypotensive verbal to increase Levophed limit to 15mcg/min and to add dopamine starting at 31mcg/min. Orders followed per MAR.

## 2019-11-15 NOTE — Progress Notes (Signed)
[facility] PCCM   I have reviewed the record for this patient.  This is a 74 y/o male who was admitted in the setting of acute onset weakness, confusion, difficulty walking.  Noted to be COVID positive on admission.  Initially admitted and cared for by stroke service.  He became unresponsive on 10/2 and had a CT scan performed which showed a large intracerebral hemorrhage.  He was intubated for airway protection.  He now has a GCS score of 3 and this morning's CT scan shows significant hydrocephalus associated with massive intraparenchymal hemorrhage.  Given the clinical context, CPR would provide no medical benefit in the event of a cardiac arrest and would cause unnecessary harm with broken ribs, pain etc.  In my opinion his code status should be DNR.  Heber Willard, MD [place] [facility] Pager: [phone] Cell: [phone] If no response, call [phone]

## 2019-11-15 NOTE — Progress Notes (Signed)
PT Cancellation Note  Patient Details Name: Robert Buckley MRN: [id number] DOB: Jun 26, 1945   Cancelled Treatment:    Reason Eval/Treat Not Completed: Other (comment) (pt moving to comfort care.  Will sign off at this time.)  11/15/2019  Jacinto Halim., PT Acute Rehabilitation Services [phone]  (pager) [phone]  (office)11/15/2019    [name] [name] 11/15/2019, 11:21 AM

## 2019-11-15 NOTE — Progress Notes (Signed)
OT Cancellation Note  Patient Details Name: Crandall Harvel MRN: [id number] DOB: 1946/01/30   Cancelled Treatment:    Reason Eval/Treat Not Completed: Patient not medically ready. Decreased medically status. Will sign off.   Jaislyn Blinn M Chatara Lucente Alexandrina Fiorini MSOT, OTR/L Acute Rehab Pager: [phone] Office: [phone] 11/15/2019, 10:15 AM

## 2019-11-15 NOTE — Progress Notes (Signed)
NAME:  Robert Buckley, MRN:  [id number], DOB:  [birth year], LOS: 13 ADMISSION DATE:  10/24/2019, CONSULTATION DATE: 11/14/2019 REFERRING MD: Dr. Thedore Mins, CHIEF COMPLAINT: Unresponsive  Brief History   74 year old male presented with acute onset generalized weakness and altered mental status with confusion and difficulty to ambulate.  Code stroke was called and patient was found also to be Covid positive, patient was admitted to hospitalist services for further management and work-up.  PCCM consulted 10/3 for acute unresponsive episode with concern for seizures  History of present illness   Robert Buckley is a 75 year old male with past medical history significant for prior stroke in 2008 with residual right-sided weakness, hypertension, stage III CKD, and osteoarthritis who presented to the emergency department with acute onset generalized weakness, altered mental status with confusion, and difficulty ambulating.  Patient was also seen with mild slurred speech and dry cough.  Initially denied any fever, chills, dyspnea, wheezing, loss of taste or smell, nausea vomiting or diarrhea.  Patient had not been vaccinated against COVID-19 but recently exposed to son who was recently diagnosed Covid positive day before patient's admission.  Patient was treated for code stroke and initial head CT had a concern for periorbital parenchymal hemorrhage and patient was admitted to neurology services.  MRI brain showed no acute intracranial hemorrhage or acute abnormalities symptoms thought secondary to recrudescence of prior stroke.  Since arrival patient has remained hospitalized for treatment of COVID-19.  At approximately 22 10/2 patient became unresponsive and rapid response was called.  Per rapid response report patient was found unresponsive with posturing of right upper extremity to painful stimuli.  Negative for gag or cough with suctioning.  Given concern for protection of airway in the setting of possible  stroke versus more likely seizure-like activities critical care was called.  Patient was transferred to 3M08 where he was intubated for airway protection.  Further work-up pending.  Past Medical History  Stroke 2008 with right-sided weakness Hypertension Stage III CKD Osteoarthritis  Significant Hospital Events   Admitted 10/21  Consults:  PCCM Neurology  Procedures:    Significant Diagnostic Tests:  MRI brain/MRI angio head 9/21 > 1. No acute intracranial abnormality. 2. Moderately advanced age-related cerebral atrophy with chronic microvascular ischemic disease, with a few scattered remote lacunar infarcts about the bilateral basal ganglia, thalami, and hemispheric cerebral white matter. 3. Small focus of susceptibility artifact at the left thalamus, corresponding with hyperdensity seen on prior CT. This is most consistent with a small focus of mineralization/calcification. No acute intracranial hemorrhage. 4. Acute ethmoidal and maxillary sinusitis, right worse than left.  MRA head 1921 Negative intracranial MRA  MRA NECK 9/21: Negative MRA of the neck.   CT angio chest 9/23 >  1. No central or segmental pulmonary embolus. Unable to evaluate at the subsegmental level. 2. Right lung interstitial opacities may be partially due to the patient's right lateral decubitus positioning; however, overlying infection cannot be excluded. Left hilar lymph node likely reactive. 3. 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  VQ scan 9/27 > The overall constellation of findings suggest a relatively low probability for pulmonary embolus  Micro Data:  MRSA PCR 9/23 > negative Blood cultures 9/21 > negative  Antimicrobials:     Interim history/subjective:  Now has no brainstem reflexes. More hypotensive.  Objective   Blood pressure (!) 86/68, pulse (!) 121, temperature 98.3 F (36.8 C), temperature source Axillary, resp. rate 18, height 5\' 7"  (1.702  m), weight 76.3 kg, SpO2 92 %.  Vent Mode: PRVC FiO2 (%):  [40 %-100 %] 100 % Set Rate:  [18 bmp] 18 bmp Vt Set:  [520 mL] 520 mL PEEP:  [5 cmH20] 5 cmH20 Plateau Pressure:  [16 cmH20] 16 cmH20   Intake/Output Summary (Last 24 hours) at 11/15/2019 0847 Last data filed at 11/15/2019 0800 Gross per 24 hour  Intake 1302.07 ml  Output 691 ml  Net 611.07 ml   Filed Weights   11/04/19 0305 11/14/19 0040  Weight: 79 kg 76.3 kg    Examination: Ill appearing man on vent Pupils fixed No corneal reflex No cough, no gag, not triggering vent   Resolved Hospital Problem list   Elevated D-dimer -Negative VQ scan and lower extremity Dopplers Concern for blood in stool -Hemoglobin remained stable with no repeat episodes likely due to constipation, evaluated by GI this admission Thrombocytopenia  Assessment & Plan:  Massive intraparenchymal bleed- nonsurvivable after evaluation by neurosurgery.  Signs of active herniation yesterday seems to have progressed to brain death.  Too unstable for apnea testing due to high O2 needs from COVID infection.  Acute hypoxemic respiratory failure, COVID pneumonia- due to above on vent with high settings.  Question aspiration after event above in addition to COVID.  Family has been called in to say their goodbyes.  The patient is critically ill with multiple organ systems failure and requires high complexity decision making for assessment and support, frequent evaluation and titration of therapies, application of advanced monitoring technologies and extensive interpretation of multiple databases. Critical Care Time devoted to patient care services described in this note independent of APP/resident time (if applicable)  is 45 minutes.   Myrla Halsted MD [facility] 11/15/2019 8:51 AM Personal pager: [phone] If unanswered, please page CCM On-call: #[phone]

## 2019-11-15 NOTE — Progress Notes (Signed)
Patient's Bps have been 50s/30s all day. Unable to perform apnea testing due to hypoxemia but prolonged PS trials show no spontaneous respirations. Will get EEG to confirm flat if have time. I have told sons they can come in to say goodbye. Death expected sometime in next day. Continue vent support in interim per family wishes.  Myrla Halsted MD PCCM

## 2019-11-15 NOTE — Plan of Care (Signed)
  Problem: Clinical Measurements: Goal: Respiratory complications will improve Outcome: Not Progressing Goal: Cardiovascular complication will be avoided Outcome: Not Progressing   Problem: Activity: Goal: Risk for activity intolerance will decrease Outcome: Not Progressing   Problem: Nutrition: Goal: Adequate nutrition will be maintained Outcome: Not Progressing   Problem: Elimination: Goal: Will not experience complications related to bowel motility Outcome: Not Progressing

## 2019-11-15 NOTE — Progress Notes (Signed)
Patient now is hypotensive, has no brainstem reflexes, GCS3.  CT head shows terminal brain bleed. Chest compressions, vasopressors, and shocks have no benefit in this clinical situation. I have called in sons to say goodbye. Expect patient to pass within an hour or two.  Myrla Halsted MD PCCM

## 2019-11-15 NOTE — Progress Notes (Signed)
EEG complete - results pending 

## 2019-11-15 NOTE — Progress Notes (Signed)
Inpatient Rehab Admissions Coordinator:   Pt. Become unresponsive 10/2 requiring intubation and remains on vent. Notes indicate pt. To transition to comfort care. CIR to s/o.   Megan Salon, MS, CCC-SLP Rehab Admissions Coordinator  [phone] (celll) [phone] (office)

## 2019-11-16 DIAGNOSIS — G9341 Metabolic encephalopathy: Secondary | ICD-10-CM | POA: Diagnosis not present

## 2019-11-16 DIAGNOSIS — N179 Acute kidney failure, unspecified: Secondary | ICD-10-CM

## 2019-11-16 DIAGNOSIS — J9601 Acute respiratory failure with hypoxia: Secondary | ICD-10-CM | POA: Diagnosis not present

## 2019-11-16 DIAGNOSIS — N1831 Chronic kidney disease, stage 3a: Secondary | ICD-10-CM | POA: Diagnosis not present

## 2019-11-16 LAB — GLUCOSE, CAPILLARY
Glucose-Capillary: 137 mg/dL — ABNORMAL HIGH (ref 70–99)
Glucose-Capillary: 143 mg/dL — ABNORMAL HIGH (ref 70–99)
Glucose-Capillary: 123 mg/dL — ABNORMAL HIGH (ref 70–99)
Glucose-Capillary: 143 mg/dL — ABNORMAL HIGH (ref 70–99)

## 2019-11-16 MED ORDER — NOREPINEPHRINE 4 MG/250ML-% IV SOLN
INTRAVENOUS | Status: AC
  Filled 2019-11-16: qty 250

## 2019-11-16 MED ORDER — DIPHENHYDRAMINE HCL 50 MG/ML IJ SOLN: 25.0000 mg | INTRAMUSCULAR | Status: DC | PRN

## 2019-11-16 MED ORDER — GLYCOPYRROLATE 0.2 MG/ML IJ SOLN: 0.2000 mg | INTRAMUSCULAR | Status: DC | PRN

## 2019-11-16 MED ORDER — POLYVINYL ALCOHOL 1.4 % OP SOLN: 1.0000 [drp] | Freq: Four times a day (QID) | OPHTHALMIC | Status: DC | PRN

## 2019-11-16 MED ORDER — NOREPINEPHRINE 4 MG/250ML-% IV SOLN
0.0000 ug/min | INTRAVENOUS | Status: DC
  Administered 2019-11-16: 2 ug/min via INTRAVENOUS
  Administered 2019-11-16: 24 ug/min via INTRAVENOUS
  Filled 2019-11-16 (×2): qty 250

## 2019-11-16 MED ORDER — ACETAMINOPHEN 325 MG PO TABS: 650.0000 mg | ORAL_TABLET | Freq: Four times a day (QID) | ORAL | Status: DC | PRN

## 2019-11-16 MED ORDER — ACETAMINOPHEN 650 MG RE SUPP: 650.0000 mg | Freq: Four times a day (QID) | RECTAL | Status: DC | PRN

## 2019-11-16 MED ORDER — GLYCOPYRROLATE 1 MG PO TABS: 1.0000 mg | ORAL_TABLET | ORAL | Status: DC | PRN

## 2019-11-16 MED ORDER — DEXTROSE 5 % IV SOLN: INTRAVENOUS | Status: DC

## 2019-11-16 MED ORDER — SODIUM CHLORIDE 0.9 % IV SOLN: INTRAVENOUS | Status: DC | PRN

## 2019-12-13 NOTE — Death Summary Note (Signed)
DEATH SUMMARY   Patient Details  Name: Robert Buckley MRN: [id number] DOB: Dec 14, 1945  Admission/Discharge Information   Admit Date:  11-13-19  Date of Death: Date of Death: 11-27-19  Time of Death: Time of Death: 2119-06-30  Length of Stay: 2022/07/06  Referring Physician: Loraine Grip, MD   Reason(s) for Hospitalization  Weakness, confusion  Diagnoses  Preliminary cause of death:   Intracerebral hemorrhage Secondary Diagnoses (including complications and co-morbidities):  Principal Problem:   Acute metabolic encephalopathy Active Problems:   Essential hypertension   Dyslipidemia   Acute on chronic kidney failure (HCC)   Right hemiparesis (HCC)   Pneumonia due to COVID-19 virus   Acute hypoxemic respiratory failure [facility])   Brief Hospital Course (including significant findings, care, treatment, and services provided and events leading to death)  74/ y/o male admitted on 11-13-22 in setting of generalized weakness and confusion, code stroke called.  Worked up for periorbital parenchymal hemorrhage by neurology, noted to be COVID positive.  On 10/2 became unresponsive, found to have a large intracerebral hemorrhage, intubated. His neurologic exam on 10/3 showed no pupillary or corneal reflexes, though he had a slight cough, gag.  EEG on 10/3 did not show seizures.  His exam worsened and on 10/4 he had no response to external stimuli, no cough, no gag, no motor response.  On 10/5 his exam was the same.  Vasopressors were used to keep his blood pressure in normal range, due to hypoxemia from COVID pneumonia he could not undergo an apnea test per our health system's guidelines.  He had an EEG performed again on 10/4 which was described as "completely flat" by the interpreting neurologist.  On 10/5 he was declared brain dead based on repeated examinations showing no neurologic activity and the EEG finding.  The patients family was informed of his poor prognosis on 10/2, 10/3, 10/4, and 10/5.  They  came to the bedside per our request on 10/5 to see the patient.  The patient died due to asystole and cardiac arrest with family present.     Pertinent Labs and Studies  Significant Diagnostic Studies EEG  Result Date: 11-27-2019 Charlsie Quest, MD     2019-11-27 10:19 AM Patient Name: Robert Buckley MRN: [id number] Epilepsy Attending: Charlsie Quest Referring Physician/Provider: Dr Levon Hedger Date: 11/15/2019 Duration: 22.06 mins Patient history: 74 y/o male who was admitted in the setting of acute onset weakness, confusion, difficulty walking.  Noted to be COVID positive on admission.  Initially admitted and cared for by stroke service.  He became unresponsive on 10/2 and had a CT scan performed which showed a large intracerebral hemorrhage. EEG to evaluate for seizure. Level of alertness:  comatose AEDs during EEG study: None Technical aspects: This EEG study was done with scalp electrodes positioned according to the 10-20 International system of electrode placement. Electrical activity was acquired at a sampling rate of 500Hz  and reviewed with a high frequency filter of 70Hz  and a low frequency filter of 1Hz . EEG data were recorded continuously and digitally stored. Description:  EEG showed continuous generalized background suppression. EEG was not reactive to noxious stimulation. Hyperventilation and photic stimulation were not performed.   ABNORMALITY - Background suppression, generalized IMPRESSION: This study is suggestive of profound diffuse encephalopathy, nonspecific etiology. No seizures or epileptiform discharges were seen throughout the recording. Charlsie Quest   DG Chest 1 View  Result Date: 10/24/2019 CLINICAL DATA:  Fever and weakness EXAM: CHEST  1 VIEW COMPARISON:  None. FINDINGS:  Left lung is grossly clear. Streaky right upper lobe and right infrahilar opacities. Normal heart size. No pneumothorax. IMPRESSION: Streaky opacities in the right upper lobe and right infrahilar  region, age indeterminate, but raise possibility of pneumonia. Electronically Signed   By: Jasmine Pang M.D.   On: 10/24/2019 23:07   CT HEAD WO CONTRAST  Addendum Date: 11/14/2019   ADDENDUM REPORT: 11/14/2019 03:16 ADDENDUM: Critical Value/emergent results were called by telephone at the time of interpretation on 11/14/2019 at 3:14 am to provider [facility] DAVIS , who verbally acknowledged these results. Electronically Signed   By: Deatra Robinson M.D.   On: 11/14/2019 03:16   Result Date: 11/14/2019 CLINICAL DATA:  Stroke follow-up EXAM: CT HEAD WITHOUT CONTRAST TECHNIQUE: Contiguous axial images were obtained from the base of the skull through the vertex without intravenous contrast. COMPARISON:  Head CT 11/08/2019 FINDINGS: Brain: There is massive intraparenchymal hemorrhage centered in the left hemisphere, measuring 6.5 x 5.6 cm. Large amount of intraventricular extension, primarily into the left occipital horn. There is severe hydrocephalus. There is periventricular hypoattenuation compatible with chronic microvascular disease. Vascular: No hyperdense vessel or unexpected calcification. Skull: Normal. Negative for fracture or focal lesion. Sinuses/Orbits: No acute finding. Other: None. IMPRESSION: 1. Massive intraparenchymal hemorrhage centered in the left hemisphere, measuring 6.5 x 5.6 cm. 2. Large amount of intraventricular extension, primarily into the left occipital horn. 3. Severe hydrocephalus. Electronically Signed: By: Deatra Robinson M.D. On: 11/14/2019 02:42   CT ANGIO CHEST PE W OR WO CONTRAST  Result Date: 11/04/2019 CLINICAL DATA:  Elevated D-dimer, COVID-19 positive, short of breath. EXAM: CT ANGIOGRAPHY CHEST WITH CONTRAST TECHNIQUE: Multidetector CT imaging of the chest was performed using the standard protocol during bolus administration of intravenous contrast. Multiplanar CT image reconstructions and MIPs were obtained to evaluate the vascular anatomy. CONTRAST:  OMNIPAQUE  IOHEXOL 350 MG/ML SOLN COMPARISON:  Chest x-ray 10/24/2019. FINDINGS: Cardiovascular: Satisfactory opacification of the pulmonary arteries to the segmental level. No evidence of pulmonary embolism. Normal heart size. No pericardial effusion. The thoracic aorta is normal in caliber. There is mild calcified atherosclerotic plaque. Mediastinum/Nodes: Several prominent bilateral and enlarged 1.2 cm left hilar lymph nodes noted. No enlarged mediastinal or axillary lymph nodes. Thyroid gland, trachea, and esophagus demonstrate no significant findings. Lungs/Pleura: Severe septal and paraseptal emphysematous changes with bullous disease at the left base. Increased interstitial opacities within the right lung may be due to compressive atelectasis in a patient in the right lateral decubitus position. Findings similar but to a much lesser extent on the left. Left upper lobe linear atelectasis. No pleural effusion. No definite pneumothorax. Upper Abdomen: No acute abnormality. Musculoskeletal: No chest wall abnormality Densely sclerotic lesion within the T3 vertebral body likely represents a bone. No suspicious lytic or blastic osseous lesions. No acute displaced fracture. Multilevel degenerative changes of the spine with large superior endplate Schmorl node involving the majority of the vertebral body the T12 level. Review of the MIP images confirms the above findings. IMPRESSION: 1. No central or segmental pulmonary embolus. Unable to evaluate at the subsegmental level. 2. Right lung interstitial opacities may be partially due to the patient's right lateral decubitus positioning; however, overlying infection cannot be excluded. Left hilar lymph node likely reactive. 3. 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 Aortic Atherosclerosis (ICD10-I70.0) and Emphysema (ICD10-J43.9). Electronically Signed   By: Tish Frederickson M.D.   On: 11/04/2019 23:00   MR ANGIO HEAD WO CONTRAST  Result Date:  11/07/2019 CLINICAL DATA:  Initial evaluation for acute stroke, right-sided weakness. EXAM: MRI HEAD WITHOUT CONTRAST MRA HEAD WITHOUT CONTRAST MRA NECK WITHOUT AND WITH CONTRAST TECHNIQUE: Multiplanar, multiecho pulse sequences of the brain and surrounding structures were obtained without intravenous contrast. Angiographic images of the Circle of Willis were obtained using MRA technique without intravenous contrast. Angiographic images of the neck were obtained using MRA technique without and with intravenous contrast. Carotid stenosis measurements (when applicable) are obtained utilizing NASCET criteria, using the distal internal carotid diameter as the denominator. CONTRAST:  8mL GADAVIST GADOBUTROL 1 MMOL/ML IV SOLN COMPARISON:  Comparison made with prior head CT from earlier the same day. FINDINGS: MRI HEAD FINDINGS Brain: Examination moderately degraded by motion artifact. Diffuse prominence of the CSF containing spaces compatible with generalized age-related cerebral atrophy, moderately advanced for age. Patchy and confluent T2/FLAIR hyperintensity within the periventricular and deep white matter both cerebral hemispheres most consistent with chronic small vessel ischemic disease, moderate in nature. Patchy involvement of the pons noted. Multiple scattered remote lacunar infarcts present about the bilateral basal ganglia, thalami, and hemispheric cerebral white matter. No abnormal foci of restricted diffusion to suggest acute or subacute ischemia. Gray-white matter differentiation maintained. No encephalomalacia to suggest chronic cortical infarction. No acute intracranial hemorrhage. Few scattered punctate foci of susceptibility artifact about the right thalamus and bilateral cerebral hemispheres, most consistent with chronic microhemorrhages, likely small vessel related. Note made of a 5 mm focus of susceptibility artifact at the left thalamus, corresponding with previously noted hyperdensity on prior CT.  This appears to correspond with calcification/mineralization on phase sequence. No mass lesion, midline shift, or mass effect. Diffuse ventricular prominence related to global parenchymal volume loss without hydrocephalus. No extra-axial fluid collection. Pituitary gland suprasellar region grossly within normal limits. Midline structures intact. Vascular: Major intracranial vascular flow voids are maintained. Skull and upper cervical spine: Craniocervical junction within normal limits. Bone marrow signal intensity grossly normal. No visible scalp soft tissue abnormality. Sinuses/Orbits: Globes and orbital soft tissues demonstrate no acute finding. Moderate mucosal thickening noted throughout the ethmoidal air cells and maxillary sinuses. Superimposed air-fluid levels within the maxillary sinuses suggest acute sinusitis, right greater than left. No mastoid effusion. Other: None. MRA HEAD FINDINGS ANTERIOR CIRCULATION: Visualized distal cervical segments of the internal carotid arteries are patent with antegrade flow. Petrous segments patent bilaterally. Short-segment mild stenosis noted at the proximal cavernous right ICA (series 2, image 102). Cavernous and supraclinoid ICAs otherwise widely patent without hemodynamically significant stenosis. A1 segments patent bilaterally. Normal anterior communicating artery complex. Anterior cerebral arteries widely patent to their distal aspects without stenosis. No M1 stenosis or occlusion. Normal MCA bifurcations. Distal MCA branches well perfused and symmetric. POSTERIOR CIRCULATION: Vertebral arteries widely patent to the vertebrobasilar junction without stenosis. Left vertebral artery dominant. Partially visualized left PICA appears patent. Right PICA not seen. Basilar widely patent to its distal aspect without stenosis. Superior cerebral arteries patent bilaterally. Both PCAs primarily supplied via the basilar well perfused to their distal aspects. No intracranial  aneurysm. MRA NECK FINDINGS AORTIC ARCH: Visualized aortic arch of normal caliber with normal 3 vessel morphology. No hemodynamically significant stenosis seen about the origin of the great vessels. RIGHT CAROTID SYSTEM: Right CCA widely patent from its origin to the bifurcation without stenosis. Mild atheromatous irregularity about the right bifurcation/proximal right ICA without hemodynamically significant stenosis. Right ICA widely patent distally to the skull base without stenosis or occlusion. LEFT CAROTID SYSTEM: Left common carotid artery widely patent from its origin to the bifurcation without stenosis.  No significant atheromatous narrowing or irregularity about the left bifurcation. Left ICA widely patent distally to the skull base without stenosis or occlusion. VERTEBRAL ARTERIES: Both vertebral arteries arise from the subclavian arteries. No proximal subclavian artery stenosis. Left vertebral artery dominant. Both vertebral arteries widely patent within the neck without stenosis or occlusion. IMPRESSION: MRI HEAD IMPRESSION: 1. No acute intracranial abnormality. 2. Moderately advanced age-related cerebral atrophy with chronic microvascular ischemic disease, with a few scattered remote lacunar infarcts about the bilateral basal ganglia, thalami, and hemispheric cerebral white matter. 3. Small focus of susceptibility artifact at the left thalamus, corresponding with hyperdensity seen on prior CT. This is most consistent with a small focus of mineralization/calcification. No acute intracranial hemorrhage. 4. Acute ethmoidal and maxillary sinusitis, right worse than left. MRA HEAD IMPRESSION: Negative intracranial MRA. No large vessel occlusion or hemodynamically significant stenosis. No aneurysm. MRA NECK IMPRESSION: Negative MRA of the neck. No hemodynamically significant or critical flow limiting stenosis about either carotid artery system. Both vertebral arteries widely patent within the neck.  Electronically Signed   By: Rise Mu M.D.   On: 11/09/2019 22:10   MR ANGIO NECK W WO CONTRAST  Result Date: 11/09/2019 CLINICAL DATA:  Initial evaluation for acute stroke, right-sided weakness. EXAM: MRI HEAD WITHOUT CONTRAST MRA HEAD WITHOUT CONTRAST MRA NECK WITHOUT AND WITH CONTRAST TECHNIQUE: Multiplanar, multiecho pulse sequences of the brain and surrounding structures were obtained without intravenous contrast. Angiographic images of the Circle of Willis were obtained using MRA technique without intravenous contrast. Angiographic images of the neck were obtained using MRA technique without and with intravenous contrast. Carotid stenosis measurements (when applicable) are obtained utilizing NASCET criteria, using the distal internal carotid diameter as the denominator. CONTRAST:  8mL GADAVIST GADOBUTROL 1 MMOL/ML IV SOLN COMPARISON:  Comparison made with prior head CT from earlier the same day. FINDINGS: MRI HEAD FINDINGS Brain: Examination moderately degraded by motion artifact. Diffuse prominence of the CSF containing spaces compatible with generalized age-related cerebral atrophy, moderately advanced for age. Patchy and confluent T2/FLAIR hyperintensity within the periventricular and deep white matter both cerebral hemispheres most consistent with chronic small vessel ischemic disease, moderate in nature. Patchy involvement of the pons noted. Multiple scattered remote lacunar infarcts present about the bilateral basal ganglia, thalami, and hemispheric cerebral white matter. No abnormal foci of restricted diffusion to suggest acute or subacute ischemia. Gray-white matter differentiation maintained. No encephalomalacia to suggest chronic cortical infarction. No acute intracranial hemorrhage. Few scattered punctate foci of susceptibility artifact about the right thalamus and bilateral cerebral hemispheres, most consistent with chronic microhemorrhages, likely small vessel related. Note made of  a 5 mm focus of susceptibility artifact at the left thalamus, corresponding with previously noted hyperdensity on prior CT. This appears to correspond with calcification/mineralization on phase sequence. No mass lesion, midline shift, or mass effect. Diffuse ventricular prominence related to global parenchymal volume loss without hydrocephalus. No extra-axial fluid collection. Pituitary gland suprasellar region grossly within normal limits. Midline structures intact. Vascular: Major intracranial vascular flow voids are maintained. Skull and upper cervical spine: Craniocervical junction within normal limits. Bone marrow signal intensity grossly normal. No visible scalp soft tissue abnormality. Sinuses/Orbits: Globes and orbital soft tissues demonstrate no acute finding. Moderate mucosal thickening noted throughout the ethmoidal air cells and maxillary sinuses. Superimposed air-fluid levels within the maxillary sinuses suggest acute sinusitis, right greater than left. No mastoid effusion. Other: None. MRA HEAD FINDINGS ANTERIOR CIRCULATION: Visualized distal cervical segments of the internal carotid arteries are patent with antegrade  flow. Petrous segments patent bilaterally. Short-segment mild stenosis noted at the proximal cavernous right ICA (series 2, image 102). Cavernous and supraclinoid ICAs otherwise widely patent without hemodynamically significant stenosis. A1 segments patent bilaterally. Normal anterior communicating artery complex. Anterior cerebral arteries widely patent to their distal aspects without stenosis. No M1 stenosis or occlusion. Normal MCA bifurcations. Distal MCA branches well perfused and symmetric. POSTERIOR CIRCULATION: Vertebral arteries widely patent to the vertebrobasilar junction without stenosis. Left vertebral artery dominant. Partially visualized left PICA appears patent. Right PICA not seen. Basilar widely patent to its distal aspect without stenosis. Superior cerebral arteries  patent bilaterally. Both PCAs primarily supplied via the basilar well perfused to their distal aspects. No intracranial aneurysm. MRA NECK FINDINGS AORTIC ARCH: Visualized aortic arch of normal caliber with normal 3 vessel morphology. No hemodynamically significant stenosis seen about the origin of the great vessels. RIGHT CAROTID SYSTEM: Right CCA widely patent from its origin to the bifurcation without stenosis. Mild atheromatous irregularity about the right bifurcation/proximal right ICA without hemodynamically significant stenosis. Right ICA widely patent distally to the skull base without stenosis or occlusion. LEFT CAROTID SYSTEM: Left common carotid artery widely patent from its origin to the bifurcation without stenosis. No significant atheromatous narrowing or irregularity about the left bifurcation. Left ICA widely patent distally to the skull base without stenosis or occlusion. VERTEBRAL ARTERIES: Both vertebral arteries arise from the subclavian arteries. No proximal subclavian artery stenosis. Left vertebral artery dominant. Both vertebral arteries widely patent within the neck without stenosis or occlusion. IMPRESSION: MRI HEAD IMPRESSION: 1. No acute intracranial abnormality. 2. Moderately advanced age-related cerebral atrophy with chronic microvascular ischemic disease, with a few scattered remote lacunar infarcts about the bilateral basal ganglia, thalami, and hemispheric cerebral white matter. 3. Small focus of susceptibility artifact at the left thalamus, corresponding with hyperdensity seen on prior CT. This is most consistent with a small focus of mineralization/calcification. No acute intracranial hemorrhage. 4. Acute ethmoidal and maxillary sinusitis, right worse than left. MRA HEAD IMPRESSION: Negative intracranial MRA. No large vessel occlusion or hemodynamically significant stenosis. No aneurysm. MRA NECK IMPRESSION: Negative MRA of the neck. No hemodynamically significant or critical flow  limiting stenosis about either carotid artery system. Both vertebral arteries widely patent within the neck. Electronically Signed   By: Rise Mu M.D.   On: 10/24/2019 22:10   MR BRAIN WO CONTRAST  Result Date: 11/07/2019 CLINICAL DATA:  Initial evaluation for acute stroke, right-sided weakness. EXAM: MRI HEAD WITHOUT CONTRAST MRA HEAD WITHOUT CONTRAST MRA NECK WITHOUT AND WITH CONTRAST TECHNIQUE: Multiplanar, multiecho pulse sequences of the brain and surrounding structures were obtained without intravenous contrast. Angiographic images of the Circle of Willis were obtained using MRA technique without intravenous contrast. Angiographic images of the neck were obtained using MRA technique without and with intravenous contrast. Carotid stenosis measurements (when applicable) are obtained utilizing NASCET criteria, using the distal internal carotid diameter as the denominator. CONTRAST:  8mL GADAVIST GADOBUTROL 1 MMOL/ML IV SOLN COMPARISON:  Comparison made with prior head CT from earlier the same day. FINDINGS: MRI HEAD FINDINGS Brain: Examination moderately degraded by motion artifact. Diffuse prominence of the CSF containing spaces compatible with generalized age-related cerebral atrophy, moderately advanced for age. Patchy and confluent T2/FLAIR hyperintensity within the periventricular and deep white matter both cerebral hemispheres most consistent with chronic small vessel ischemic disease, moderate in nature. Patchy involvement of the pons noted. Multiple scattered remote lacunar infarcts present about the bilateral basal ganglia, thalami, and hemispheric cerebral white matter.  No abnormal foci of restricted diffusion to suggest acute or subacute ischemia. Gray-white matter differentiation maintained. No encephalomalacia to suggest chronic cortical infarction. No acute intracranial hemorrhage. Few scattered punctate foci of susceptibility artifact about the right thalamus and bilateral cerebral  hemispheres, most consistent with chronic microhemorrhages, likely small vessel related. Note made of a 5 mm focus of susceptibility artifact at the left thalamus, corresponding with previously noted hyperdensity on prior CT. This appears to correspond with calcification/mineralization on phase sequence. No mass lesion, midline shift, or mass effect. Diffuse ventricular prominence related to global parenchymal volume loss without hydrocephalus. No extra-axial fluid collection. Pituitary gland suprasellar region grossly within normal limits. Midline structures intact. Vascular: Major intracranial vascular flow voids are maintained. Skull and upper cervical spine: Craniocervical junction within normal limits. Bone marrow signal intensity grossly normal. No visible scalp soft tissue abnormality. Sinuses/Orbits: Globes and orbital soft tissues demonstrate no acute finding. Moderate mucosal thickening noted throughout the ethmoidal air cells and maxillary sinuses. Superimposed air-fluid levels within the maxillary sinuses suggest acute sinusitis, right greater than left. No mastoid effusion. Other: None. MRA HEAD FINDINGS ANTERIOR CIRCULATION: Visualized distal cervical segments of the internal carotid arteries are patent with antegrade flow. Petrous segments patent bilaterally. Short-segment mild stenosis noted at the proximal cavernous right ICA (series 2, image 102). Cavernous and supraclinoid ICAs otherwise widely patent without hemodynamically significant stenosis. A1 segments patent bilaterally. Normal anterior communicating artery complex. Anterior cerebral arteries widely patent to their distal aspects without stenosis. No M1 stenosis or occlusion. Normal MCA bifurcations. Distal MCA branches well perfused and symmetric. POSTERIOR CIRCULATION: Vertebral arteries widely patent to the vertebrobasilar junction without stenosis. Left vertebral artery dominant. Partially visualized left PICA appears patent. Right PICA  not seen. Basilar widely patent to its distal aspect without stenosis. Superior cerebral arteries patent bilaterally. Both PCAs primarily supplied via the basilar well perfused to their distal aspects. No intracranial aneurysm. MRA NECK FINDINGS AORTIC ARCH: Visualized aortic arch of normal caliber with normal 3 vessel morphology. No hemodynamically significant stenosis seen about the origin of the great vessels. RIGHT CAROTID SYSTEM: Right CCA widely patent from its origin to the bifurcation without stenosis. Mild atheromatous irregularity about the right bifurcation/proximal right ICA without hemodynamically significant stenosis. Right ICA widely patent distally to the skull base without stenosis or occlusion. LEFT CAROTID SYSTEM: Left common carotid artery widely patent from its origin to the bifurcation without stenosis. No significant atheromatous narrowing or irregularity about the left bifurcation. Left ICA widely patent distally to the skull base without stenosis or occlusion. VERTEBRAL ARTERIES: Both vertebral arteries arise from the subclavian arteries. No proximal subclavian artery stenosis. Left vertebral artery dominant. Both vertebral arteries widely patent within the neck without stenosis or occlusion. IMPRESSION: MRI HEAD IMPRESSION: 1. No acute intracranial abnormality. 2. Moderately advanced age-related cerebral atrophy with chronic microvascular ischemic disease, with a few scattered remote lacunar infarcts about the bilateral basal ganglia, thalami, and hemispheric cerebral white matter. 3. Small focus of susceptibility artifact at the left thalamus, corresponding with hyperdensity seen on prior CT. This is most consistent with a small focus of mineralization/calcification. No acute intracranial hemorrhage. 4. Acute ethmoidal and maxillary sinusitis, right worse than left. MRA HEAD IMPRESSION: Negative intracranial MRA. No large vessel occlusion or hemodynamically significant stenosis. No  aneurysm. MRA NECK IMPRESSION: Negative MRA of the neck. No hemodynamically significant or critical flow limiting stenosis about either carotid artery system. Both vertebral arteries widely patent within the neck. Electronically Signed   By: Sharlet Salina  Phill Myron M.D.   On: 11/01/2019 22:10   NM Pulmonary Perfusion  Result Date: 11/08/2019 CLINICAL DATA:  Hypoxia.  Positive D-dimer study. EXAM: NUCLEAR MEDICINE PERFUSION LUNG SCAN TECHNIQUE: Perfusion images were obtained in multiple projections after intravenous injection of radiopharmaceutical. Views: Anterior, posterior, left lateral, right lateral, RPO, LPO, RAO, LAO RADIOPHARMACEUTICALS:  4.4 mCi Tc-83m MAA IV COMPARISON:  Chest radiograph November 08, 2019 and CT angiogram chest November 04, 2019 FINDINGS: There is apparent photopenia throughout significant portions of each upper lobe. Elsewhere distribution of radiotracer uptake is normal. Note that there is airspace consolidation in a portion of the right upper lobe. CT shows evidence of bullous disease in portions of the left upper lobe. IMPRESSION: Areas of photopenia in each upper lobe region. Pneumonia in the right upper lobe. Bullous disease left upper lobe. It is possible that the areas of photopenia are due to these underlying factors. Radiotracer uptake is otherwise symmetric and unremarkable. The overall constellation of findings suggest a relatively low probability for pulmonary embolus. Pulmonary embolus is not excluded, however. If further assessment for pulmonary embolus is felt to be warranted, repeat chest CT angiogram would be a reasonable consideration. Potentially bilateral lower extremity duplex venous ultrasound could be helpful as well in this circumstance. Electronically Signed   By: Bretta Bang III M.D.   On: 11/08/2019 15:07   DG CHEST PORT 1 VIEW  Result Date: 11/14/2019 CLINICAL DATA:  Rapid response.  COVID positive. EXAM: PORTABLE CHEST 1 VIEW COMPARISON:   11/10/2019 FINDINGS: The endotracheal tube terminates at the carina. Repositioning is recommended. The OG tube tip terminates in the expected region of the gastric antrum/pylorus. There are persistent hazy bilateral airspace opacities with interval worsening in the right lower and right mid lung zones. No pneumothorax. The lung volumes are low. The heart size is unchanged. Atherosclerotic changes are noted of the thoracic aorta. IMPRESSION: 1. Endotracheal tube terminates at the carina. Repositioning is recommended. 2. Persistent bilateral hazy airspace opacities with interval worsening in the right lower and right mid lung zones. These results will be called to the ordering clinician or representative by the Radiologist Assistant, and communication documented in the PACS or [employer]. Electronically Signed   By: Katherine Mantle M.D.   On: 11/14/2019 00:45   DG Chest Port 1 View  Result Date: 11/10/2019 CLINICAL DATA:  Shortness of breath, COVID-19 positive, congestion, history hypertension and stroke EXAM: PORTABLE CHEST 1 VIEW COMPARISON:  Portable exam 0838 hours compared to 11/08/2019 FINDINGS: Rotated to the RIGHT. Normal heart size, mediastinal contours, and pulmonary vascularity. Improved RIGHT upper lobe infiltrate and volume loss. Persistent bibasilar atelectasis. No new infiltrate, pleural effusion or pneumothorax. IMPRESSION: Bibasilar atelectasis. Improved RIGHT upper lobe infiltrate and volume loss since previous study. Electronically Signed   By: Ulyses Southward M.D.   On: 11/10/2019 08:53   DG CHEST PORT 1 VIEW  Result Date: 11/08/2019 CLINICAL DATA:  COVID.  Hypoxia. EXAM: PORTABLE CHEST 1 VIEW COMPARISON:  11/06/2019. FINDINGS: Mediastinum stable. Heart size stable. Progressive right upper lobe atelectasis. Wedge-shaped density noted in the right upper lobe. Pulmonary infarct cannot be excluded. Persistent bilateral interstitial infiltrates are again noted. No pleural effusion or  pneumothorax. Left costophrenic angle incompletely imaged. No acute bony abnormality. IMPRESSION: 1. Progressive right upper lobe atelectasis. Wedge-shaped density noted in the right upper lobe. Pulmonary infarct cannot be excluded. 2.  Persistent bilateral interstitial infiltrates are again noted. Electronically Signed   By: Maisie Fus  Register   On: 11/08/2019 05:27  DG CHEST PORT 1 VIEW  Result Date: 11/06/2019 CLINICAL DATA:  COVID-19 positivity with shortness of breath EXAM: PORTABLE CHEST 1 VIEW COMPARISON:  11/02/2018 FINDINGS: Cardiac shadow is accentuated by the portable technique. Bibasilar increasing airspace opacity is noted consistent with the given clinical history of COVID-19 positivity. No sizable effusion is seen. No bony abnormality is noted. IMPRESSION: Increasing bibasilar opacities consistent with the given clinical history. Electronically Signed   By: Alcide Clever M.D.   On: 11/06/2019 14:11   DG Chest Portable 1 View  Result Date: Nov 03, 2019 CLINICAL DATA:  COVID positive. EXAM: PORTABLE CHEST 1 VIEW COMPARISON:  October 24, 2019 FINDINGS: Mild infiltrates are seen along the periphery of the right lung. This is increased in severity when compared to the prior study. There is no evidence of a pleural effusion or pneumothorax. The heart size and mediastinal contours are within normal limits. The visualized skeletal structures are unremarkable. IMPRESSION: Mild right lung infiltrates, increased in severity when compared to the prior study. Electronically Signed   By: Aram Candela M.D.   On: 11/03/2019 18:02   EEG adult  Result Date: 11/14/2019 Charlsie Quest, MD     11/14/2019 10:45 AM Patient Name: Azaria Bartell MRN: [id number] Epilepsy Attending: Charlsie Quest Referring Physician/Provider: Dr Raymon Mutton, NP Date: 11/14/2019 Duration: 23.23 mins Patient history: 74 year old male that presented on 2019-11-03 for acute onset generalized weakness with altered mental status.   He was found to be Covid positive and was being treated for Covid pneumonia.  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  Emergent consultation was obtained because the patient acutely became unresponsive tonight.  He no longer had gag or cough.  He did have tremulous movement with decortication of the right upper extremity and is tremulous movement of the right lower extremity. EEG to evaluate for seizure. Level of alertness:  comatose AEDs during EEG study: Propofol Technical aspects: This EEG study was done with scalp electrodes positioned according to the 10-20 International system of electrode placement. Electrical activity was acquired at a sampling rate of 500Hz  and reviewed with a high frequency filter of 70Hz  and a low frequency filter of 1Hz . EEG data were recorded continuously and digitally stored. Description: EEG showed continuous generalized background suppression. EEG was not reactive to tactile stimulation.   Hyperventilation and photic stimulation were not performed.   ABNORMALITY -Background suppression, generalized IMPRESSION: This study is suggestive of profound diffuse encephalopathy, nonspecific etiology. No seizures or epileptiform discharges were seen throughout the recording. Charlsie Quest   ECHOCARDIOGRAM COMPLETE  Result Date: 11/04/2019    ECHOCARDIOGRAM REPORT   Patient Name:   LESSLIE MCKEEHAN Date of Exam: 11/04/2019 Medical Rec #:  [id number]      Height:       67.0 in Accession #:    [id number]     Weight:       174.2 lb Date of Birth:  1945/09/24     BSA:          1.907 m Patient Age:    73 years       BP:           160/102 mmHg Patient Gender: M              HR:           97 bpm. Exam Location:  Inpatient Procedure: 2D Echo Indications:    Elevated brain natriuretic peptide (BNP) level  History:  Patient has prior history of Echocardiogram examinations, most                 recent 09/30/2016. Stroke; Risk Factors:Hypertension.  Sonographer:     Celene Skeen RDCS (AE) Referring Phys: [phone] A CALDWELL POWELL JR  Sonographer Comments: No parasternal window. Image acquisition challenging due to respiratory motion. restricted mobility IMPRESSIONS  1. Left ventricular ejection fraction, by estimation, is 60 to 65%. The left ventricle has normal function. The left ventricle has no regional wall motion abnormalities. Left ventricular diastolic parameters are consistent with Grade I diastolic dysfunction (impaired relaxation).  2. Right ventricular systolic function was not well visualized. The right ventricular size is not well visualized.  3. The mitral valve was not well visualized. No evidence of mitral valve regurgitation.  4. The aortic valve was not well visualized. Aortic valve regurgitation is not visualized. FINDINGS  Left Ventricle: Left ventricular ejection fraction, by estimation, is 60 to 65%. The left ventricle has normal function. The left ventricle has no regional wall motion abnormalities. The left ventricular internal cavity size was normal in size. There is  no left ventricular hypertrophy. Left ventricular diastolic parameters are consistent with Grade I diastolic dysfunction (impaired relaxation). Right Ventricle: The right ventricular size is not well visualized. Right vetricular wall thickness was not assessed. Right ventricular systolic function was not well visualized. Left Atrium: Left atrial size was normal in size. Right Atrium: Right atrial size was not assessed. Pericardium: There is no evidence of pericardial effusion. Mitral Valve: The mitral valve was not well visualized. No evidence of mitral valve regurgitation. Tricuspid Valve: The tricuspid valve is not well visualized. Tricuspid valve regurgitation is not demonstrated. Aortic Valve: The aortic valve was not well visualized. Aortic valve regurgitation is not visualized. Pulmonic Valve: The pulmonic valve was not well visualized. Pulmonic valve regurgitation is not  visualized. Aorta: The aortic root was not well visualized. IAS/Shunts: The interatrial septum was not assessed.   Diastology LV e' medial:    6.53 cm/s LV E/e' medial:  9.7 LV e' lateral:   6.64 cm/s LV E/e' lateral: 9.5  LEFT ATRIUM           Index LA Vol (A2C): 23.0 ml 12.06 ml/m LA Vol (A4C): 18.4 ml 9.65 ml/m  AORTIC VALVE LVOT Vmax:   90.10 cm/s LVOT Vmean:  72.700 cm/s LVOT VTI:    0.180 m MITRAL VALVE MV Area (PHT): 2.87 cm    SHUNTS MV Decel Time: 264 msec    Systemic VTI: 0.18 m MV E velocity: 63.40 cm/s MV A velocity: 71.60 cm/s MV E/A ratio:  0.89 Kristeen Miss MD Electronically signed by Kristeen Miss MD Signature Date/Time: 11/04/2019/3:20:05 PM    Final    CT HEAD CODE STROKE WO CONTRAST  Addendum Date: 11/18/2019   ADDENDUM REPORT: Nov 18, 2019 17:44 ADDENDUM: These results were called by telephone at the time of interpretation on 11/18/2019 at 5:38 pm to provider Dr. Iver Nestle, who verbally acknowledged these results. Electronically Signed   By: Jackey Loge DO   On: November 18, 2019 17:44   Result Date: Nov 18, 2019 CLINICAL DATA:  Code stroke. Neuro deficit, acute, stroke suspected. EXAM: CT HEAD WITHOUT CONTRAST TECHNIQUE: Contiguous axial images were obtained from the base of the skull through the vertex without intravenous contrast. COMPARISON:  Noncontrast head CT 10/24/2019 FINDINGS: Brain: Stable, moderate generalized parenchymal atrophy. As before, there is advanced ill-defined hypoattenuation within the cerebral white matter which is nonspecific, but consistent with chronic small vessel ischemic disease. Redemonstrated  multiple chronic lacunar infarcts within the bilateral basal ganglia and left thalamus. New from the prior head CT of 10/24/2019, there is a 5 mm focus of hyperdensity within the left thalamocapsular junction at site of a chronic lacunar infarct which may reflect a small parenchymal hemorrhage or mineralization (series 3, image 16) (series 5, image 43). No demarcated cortical  infarct is identified. No extra-axial fluid collection. No evidence of intracranial mass. No midline shift. Vascular: No definite hyperdense vessel. Atherosclerotic calcifications Skull: Normal. Negative for fracture or focal lesion. Sinuses/Orbits: Visualized orbits show no acute finding. Extensive partial opacification of bilateral ethmoid air cells. Frothy secretions and small air-fluid level within the left maxillary sinus. Near complete opacification of the right maxillary sinus. Mild-to-moderate mucosal thickening within the sphenoid sinuses. No significant mastoid effusion. ASPECTS ([place] Stroke Program Early CT Score) - Ganglionic level infarction (caudate, lentiform nuclei, internal capsule, insula, M1-M3 cortex): 7 - Supraganglionic infarction (M4-M6 cortex): 3 Total score (0-10 with 10 being normal): 10 IMPRESSION: New from the prior head CT of 10/24/2019, there is a 5 mm focus of hyperdensity within the left thalamocapsular junction (at site of a chronic lacunar infarct) which may reflect parenchymal hemorrhage or mineralization. No acute demarcated cortical infarct is identified. Unchanged moderate generalized parenchymal atrophy and advanced cerebral white matter chronic small vessel ischemic disease. Redemonstrated chronic lacunar infarcts within the bilateral basal ganglia and left thalamus. Paranasal sinus disease, most notably ethmoid and maxillary sinusitis. Electronically Signed: By: Jackey Loge DO On: 2019-11-23 17:37   CT HEAD CODE STROKE WO CONTRAST  Result Date: 10/24/2019 CLINICAL DATA:  Code stroke. Initial evaluation for acute left-sided weakness. EXAM: CT HEAD WITHOUT CONTRAST TECHNIQUE: Contiguous axial images were obtained from the base of the skull through the vertex without intravenous contrast. COMPARISON:  Prior MRI from 09/29/2016. FINDINGS: Brain: Age-related cerebral atrophy with advanced chronic microvascular ischemic disease. Scattered remote lacunar infarcts noted about  the bilateral basal ganglia. No acute intracranial hemorrhage. No acute large vessel territory infarct. No mass lesion, midline shift or mass effect. Diffuse ventricular prominence related global parenchymal volume loss without hydrocephalus. No extra-axial fluid collection. Vascular: No hyperdense vessel. Scattered vascular calcifications about the carotid siphons. Skull: Scalp soft tissues within normal limits.  Calvarium intact. Sinuses/Orbits: Globes and orbital soft tissues within normal limits. Visualized paranasal sinuses are largely clear. No mastoid effusion. Other: None. ASPECTS [facility] Stroke Program Early CT Score) - Ganglionic level infarction (caudate, lentiform nuclei, internal capsule, insula, M1-M3 cortex): 7 - Supraganglionic infarction (M4-M6 cortex): 3 Total score (0-10 with 10 being normal): 10 IMPRESSION: 1. No acute intracranial infarct or other abnormality. 2. ASPECTS is 10. 3. Moderately advanced age-related cerebral atrophy with chronic small vessel ischemic disease. Critical Value/emergent results were called by telephone at the time of interpretation on 10/24/2019 at 10:32 pm to provider Jene Every , who verbally acknowledged these results. Electronically Signed   By: Rise Mu M.D.   On: 10/24/2019 22:32   VAS [place] LOWER EXTREMITY VENOUS (DVT)  Result Date: 11/09/2019  Lower Venous DVT Study Indications: Elevated Ddimer.  Risk Factors: COVID 19 positive. Limitations: Body habitus, poor ultrasound/tissue interface and patient positioning. Comparison Study: No prior studies. Performing Technologist: Chanda Busing RVT  Examination Guidelines: A complete evaluation includes B-mode imaging, spectral Doppler, color Doppler, and power Doppler as needed of all accessible portions of each vessel. Bilateral testing is considered an integral part of a complete examination. Limited examinations for reoccurring indications may be performed as noted. The reflux portion  of the exam is  performed with the patient in reverse Trendelenburg.  +---------+---------------+---------+-----------+----------+--------------+ RIGHT    CompressibilityPhasicitySpontaneityPropertiesThrombus Aging +---------+---------------+---------+-----------+----------+--------------+ CFV      Full           Yes      Yes                                 +---------+---------------+---------+-----------+----------+--------------+ SFJ      Full                                                        +---------+---------------+---------+-----------+----------+--------------+ FV Prox  Full                                                        +---------+---------------+---------+-----------+----------+--------------+ FV Mid   Full                                                        +---------+---------------+---------+-----------+----------+--------------+ FV DistalFull                                                        +---------+---------------+---------+-----------+----------+--------------+ PFV      Full                                                        +---------+---------------+---------+-----------+----------+--------------+ POP      Full           Yes      Yes                                 +---------+---------------+---------+-----------+----------+--------------+ PTV      Full                                                        +---------+---------------+---------+-----------+----------+--------------+ PERO     Full                                                        +---------+---------------+---------+-----------+----------+--------------+   +---------+---------------+---------+-----------+----------+--------------+ LEFT     CompressibilityPhasicitySpontaneityPropertiesThrombus Aging +---------+---------------+---------+-----------+----------+--------------+ CFV      Full           Yes      Yes                                  +---------+---------------+---------+-----------+----------+--------------+  SFJ      Full                                                        +---------+---------------+---------+-----------+----------+--------------+ FV Prox  Full                                                        +---------+---------------+---------+-----------+----------+--------------+ FV Mid   Full                                                        +---------+---------------+---------+-----------+----------+--------------+ FV DistalFull                                                        +---------+---------------+---------+-----------+----------+--------------+ PFV      Full                                                        +---------+---------------+---------+-----------+----------+--------------+ POP      Full           Yes      Yes                                 +---------+---------------+---------+-----------+----------+--------------+ PTV      Full                                                        +---------+---------------+---------+-----------+----------+--------------+ PERO     Full                                                        +---------+---------------+---------+-----------+----------+--------------+     Summary: RIGHT: - There is no evidence of deep vein thrombosis in the lower extremity.  - No cystic structure found in the popliteal fossa.  LEFT: - There is no evidence of deep vein thrombosis in the lower extremity.  - No cystic structure found in the popliteal fossa.  *See table(s) above for measurements and observations. Electronically signed by Waverly Ferrari MD on 11/09/2019 at 6:18:42 PM.    Final     Microbiology No results found for this or any previous visit (from the past 240 hour(s)).  Lab Basic Metabolic Panel: Recent Labs  Lab 11/12/19 0959 11/14/19 0128 11/14/19 0155 11/15/19 1716  11/15/19 1740  NA 143 138  140 143 141  K 3.7 4.2 4.1 6.5* 6.7*  CL 107 106  --   --   --   CO2 26 21*  --   --   --   GLUCOSE 105* 202*  --   --   --   BUN 35* 27*  --   --   --   CREATININE 1.74* 1.80*  --   --   --   CALCIUM 7.9* 7.9*  --   --   --   MG 2.4 2.0  --   --   --    Liver Function Tests: Recent Labs  Lab 11/12/19 0959 11/14/19 0128  AST 21 24  ALT 93* 70*  ALKPHOS 58 60  BILITOT 1.0 1.3*  PROT 5.3* 5.5*  ALBUMIN 2.5* 2.7*   No results for input(s): LIPASE, AMYLASE in the last 168 hours. No results for input(s): AMMONIA in the last 168 hours. CBC: Recent Labs  Lab 11/12/19 0959 11/14/19 0128 11/14/19 0155 11/15/19 1716 11/15/19 1740  WBC 11.5* 16.1*  --   --   --   NEUTROABS 9.7* 14.2*  --   --   --   HGB 15.1 15.7 15.0 12.6* 12.2*  HCT 43.2 44.6 44.0 37.0* 36.0*  MCV 90.4 91.8  --   --   --   PLT 160 236  --   --   --    Cardiac Enzymes: No results for input(s): CKTOTAL, CKMB, CKMBINDEX, TROPONINI in the last 168 hours. Sepsis Labs: Recent Labs  Lab 11/12/19 0959 11/14/19 0128 11/15/19 0539  PROCALCITON <0.10 <0.10 26.37  WBC 11.5* 16.1*  --     Procedures/Operations  10/2 Endotracheal tube   Heber Huxley 11/18/2019, 6:29 PM

## 2019-12-13 NOTE — Progress Notes (Addendum)
Family at bedside. Requesting to talk to a physician. E-link notified.   Barbaraann Cao, RN  November 22, 2019 2000

## 2019-12-13 NOTE — Progress Notes (Signed)
Called to bedside this evening to speak with family.  Plan was for extubation this evening with family present.    Patient was inconsistenly flexing at R elbow, extending r leg both spontaneously and after painful stimulation to R nailbed.  Discussed with sons at bedside at the time that it appeared consistent with possible flexural posturing, consistent with severe brain injury.    Shortly after this exam and discussion, patient expired due to asystole and cardiac death.  No changes had been made yet to his care when he expired (he had remained intubated and on pressors at the time).

## 2019-12-13 NOTE — Progress Notes (Signed)
Made a second attempt to contact the pt's son regarding a time for visitation. There was no answer in this second attempt. Will notify Dr. Kendrick Fries.

## 2019-12-13 NOTE — Progress Notes (Signed)
[facility]  Family conversation note.  On 10/4 the patient's family informed us they were going to come in to see their father before compassionate extubation.  They did not come On 10/5 I spoke with Tasia Catchings in the morning and he said that he and his family were coming in later in the day to see their father prior to compassionate extubation. They did not come.  He did not return multiple calls from me or the nurse later in the afternoon.  At 1755 the patient was declared brain dead.  I called Ladene Artist (Craig's brother) at 71 and he said they were going to come in tomorrow.  I advised that we would not wait to remove their father from the ventilator because he is brain dead and I cannot continue life support knowing he is dead.  I advised they need to come in immediately.  They indicated they are going to come in now.  Heber Kaplan, MD [place] [facility] Pager: [phone] Cell: [phone] If no response, call [phone]

## 2019-12-13 NOTE — Procedures (Signed)
Patient Name: Robert Buckley  MRN: [id number]  Epilepsy Attending: Charlsie Quest  Referring Physician/Provider: Dr Levon Hedger Date: 11/15/2019  Duration: 22.06 mins  Patient history: 74 y/o male who was admitted in the setting of acute onset weakness, confusion, difficulty walking.  Noted to be COVID positive on admission.  Initially admitted and cared for by stroke service.  He became unresponsive on 10/2 and had a CT scan performed which showed a large intracerebral hemorrhage. EEG to evaluate for seizure.   Level of alertness:  comatose  AEDs during EEG study: None  Technical aspects: This EEG study was done with scalp electrodes positioned according to the 10-20 International system of electrode placement. Electrical activity was acquired at a sampling rate of 500Hz  and reviewed with a high frequency filter of 70Hz  and a low frequency filter of 1Hz . EEG data were recorded continuously and digitally stored.   Description:  EEG showed continuous generalized background suppression. EEG was not reactive to noxious stimulation. Hyperventilation and photic stimulation were not performed.     ABNORMALITY - Background suppression, generalized  IMPRESSION: This study is suggestive of profound diffuse encephalopathy, nonspecific etiology. No seizures or epileptiform discharges were seen throughout the recording.  Mimie Goering 

## 2019-12-13 NOTE — Progress Notes (Signed)
Attempted to call pt's son, Ardith Lewman to obtain a definitive time for visitation. The call was sent straight to voicemail. Will notify Dr. Kendrick Fries. Will call back in a few minutes.

## 2019-12-13 NOTE — Progress Notes (Signed)
NAME:  Robert Buckley, MRN:  [id number], DOB:  Nov 10, 1945, LOS: 14 ADMISSION DATE:  10/27/2019, CONSULTATION DATE:  10/3 REFERRING MD:  Thedore Mins, CHIEF COMPLAINT:  Found unresponsive   Brief History   73/ y/o male admitted on 9/21 in setting of generalized weakness and confusion, code stroke called.  Worked up for periorbital parenchymal hemorrhage by neurology, noted to be COVID positive.  On 10/2 became unresponsive, found to have a large intracerebral hemorrhage, intubated. Had seizure.  Past Medical History  Stroke 2008 with right-sided weakness Hypertension Stage III CKD Osteoarthritis  Significant Hospital Events   Admitted 9/21, work up for stroke Unresponsive 10/2, intubated, large intracerebral hemorrhage  Consults:  PCCM Neurology  Procedures:  10/2 ETT >   Significant Diagnostic Tests:  MRI brain/MRI angio head 9/21 > 1. No acute intracranial abnormality. 2. Moderately advanced age-related cerebral atrophy with chronic microvascular ischemic disease, with a few scattered remote lacunar infarcts about the bilateral basal ganglia, thalami, and hemispheric cerebral white matter. 3. Small focus of susceptibility artifact at the left thalamus, corresponding with hyperdensity seen on prior CT. This is most consistent with a small focus of mineralization/calcification. No acute intracranial hemorrhage. 4. Acute ethmoidal and maxillary sinusitis, right worse than left.  MRA head 1921 Negative intracranial MRA  MRA NECK 9/21: Negative MRA of the neck.   CT angio chest 9/23 >  1. No central or segmental pulmonary embolus. Unable to evaluate at the subsegmental level. 2. Right lung interstitial opacities may be partially due to the patient's right lateral decubitus positioning; however, overlying infection cannot be excluded. Left hilar lymph node likely reactive. 3. 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  VQ scan 9/27 > The overall  constellation of findings suggest a relatively low probability for pulmonary embolus  CT Head 10/3 > 1. Massive intraparenchymal hemorrhage centered in the left hemisphere, measuring 6.5 x 5.6 cm. 2. Large amount of intraventricular extension, primarily into the left occipital horn. 3. Severe hydrocephalus  Micro Data:  9/12 SARS COV 2 > positive  9/21 blood>   Antimicrobials:  9/21 ceftriaxone x1, 9/23 > 9/27 9/21 azithro x1, 9/23 > 9/27  Interim history/subjective:  No response to external stimuli  No changes  Objective   Blood pressure (!) 61/43, pulse 98, temperature (!) 96.3 F (35.7 C), temperature source Axillary, resp. rate (!) 8, height 5\' 7"  (1.702 m), weight 76.3 kg, SpO2 (!) 83 %.    Vent Mode: PRVC FiO2 (%):  [30 %-100 %] 30 % Set Rate:  [8 bmp] 8 bmp Vt Set:  [520 mL] 520 mL PEEP:  [5 cmH20] 5 cmH20 Plateau Pressure:  [16 cmH20-19 cmH20] 16 cmH20   Intake/Output Summary (Last 24 hours) at 12/16/19 0751 Last data filed at 12/16/19 01/16/2020 Gross per 24 hour  Intake 105.35 ml  Output 740 ml  Net -634.65 ml   Filed Weights   11/04/19 0305 11/14/19 0040  Weight: 79 kg 76.3 kg    Examination:  General:  In bed on vent HENT: NCAT ETT in place PULM: CTA B, vent supported breathing CV: RRR, no mgr GI: BS+, soft, nontender MSK: normal bulk and tone Neuro: no corneal response, no pain response, no eye opening   Resolved Hospital Problem list     Assessment & Plan:  Massive intracerebral hemorrhage > Neuro exam consistent with brain death for me as well as Dr. 01/14/20, however he is hypotensive this morning.   EEG is completely flat, consistent with brain death > start  levophed, goal MAP > 65 > repeat neuro exam after getting blood pressure up > will declare brain dead if neuro exam consistent when blood pressure up  Acute hypoxemic respiratory failure in setting of COVID 19 pneumonia Full mechanical vent support VAP prevention Daily  WUA/SBT   Best practice:  Diet: continue tube feeding Pain/Anxiety/Delirium protocol (if indicated): PAD protocol VAP protocol (if indicated): yes DVT prophylaxis: SCD GI prophylaxis: Pantoprazole for stress ulcer prophylaxis Glucose control: SSI, glargine Mobility: bed rest Code Status: DNR Family Communication: I called Tasia Catchings today and he said that his family is coming in to withdraw care today. They also told us the same thing yesterday but did not come in.  Will attempt to contact them again to find out if they are actually coming in.  If not then we will need to proceed with withdrawal of ventilator support if/when he is declared brain dead. Disposition:   Labs   CBC: Recent Labs  Lab 11/10/19 0616 11/10/19 0616 11/11/19 0843 11/11/19 0843 11/12/19 0959 11/14/19 0128 11/14/19 0155 11/15/19 1716 11/15/19 1740  WBC 14.5*  --  15.2*  --  11.5* 16.1*  --   --   --   NEUTROABS 13.1*  --  13.3*  --  9.7* 14.2*  --   --   --   HGB 13.6   < > 15.9   < > 15.1 15.7 15.0 12.6* 12.2*  HCT 39.8   < > 46.1   < > 43.2 44.6 44.0 37.0* 36.0*  MCV 89.8  --  90.4  --  90.4 91.8  --   --   --   PLT 192  --  234  --  160 236  --   --   --    < > = values in this interval not displayed.    Basic Metabolic Panel: Recent Labs  Lab 11/10/19 0616 11/10/19 0616 11/11/19 0843 11/11/19 0843 11/12/19 0959 11/14/19 0128 11/14/19 0155 11/15/19 1716 11/15/19 1740  NA 139   < > 139   < > 143 138 140 143 141  K 5.1   < > 5.0   < > 3.7 4.2 4.1 6.5* 6.7*  CL 108  --  102  --  107 106  --   --   --   CO2 23  --  25  --  26 21*  --   --   --   GLUCOSE 153*  --  168*  --  105* 202*  --   --   --   BUN 40*  --  34*  --  35* 27*  --   --   --   CREATININE 1.81*  --  1.80*  --  1.74* 1.80*  --   --   --   CALCIUM 7.9*  --  8.4*  --  7.9* 7.9*  --   --   --   MG 2.3  --  2.5*  --  2.4 2.0  --   --   --    < > = values in this interval not displayed.   GFR: Estimated Creatinine Clearance: 34.2  mL/min (A) (by C-G formula based on SCr of 1.8 mg/dL (H)). Recent Labs  Lab 11/10/19 0616 11/10/19 0616 11/11/19 0843 11/12/19 0959 11/14/19 0128 11/15/19 0539  PROCALCITON <0.10   < > <0.10 <0.10 <0.10 26.37  WBC 14.5*  --  15.2* 11.5* 16.1*  --    < > = values in this  interval not displayed.    Liver Function Tests: Recent Labs  Lab 11/10/19 0616 11/11/19 0843 11/12/19 0959 11/14/19 0128  AST 21 24 21 24   ALT 90* 84* 93* 70*  ALKPHOS 62 66 58 60  BILITOT 0.9 1.4* 1.0 1.3*  PROT 5.3* 6.2* 5.3* 5.5*  ALBUMIN 2.3* 2.7* 2.5* 2.7*   No results for input(s): LIPASE, AMYLASE in the last 168 hours. No results for input(s): AMMONIA in the last 168 hours.  ABG    Component Value Date/Time   PHART 7.318 (L) 11/15/2019 1740   PCO2ART 47.3 11/15/2019 1740   PO2ART 68 (L) 11/15/2019 1740   HCO3 24.3 11/15/2019 1740   TCO2 26 11/15/2019 1740   ACIDBASEDEF 2.0 11/15/2019 1740   O2SAT 91.0 11/15/2019 1740     Coagulation Profile: No results for input(s): INR, PROTIME in the last 168 hours.  Cardiac Enzymes: No results for input(s): CKTOTAL, CKMB, CKMBINDEX, TROPONINI in the last 168 hours.  HbA1C: Hgb A1c MFr Bld  Date/Time Value Ref Range Status  11/04/2019 03:50 PM 6.2 (H) 4.8 - 5.6 % Final    Comment:    REPEATED TO VERIFY (NOTE) Pre diabetes:          5.7%-6.4%  Diabetes:              >6.4%  Glycemic control for   <7.0% adults with diabetes   09/30/2016 04:59 AM 5.6 4.8 - 5.6 % Final    Comment:    (NOTE)         Prediabetes: 5.7 - 6.4         Diabetes: >6.4         Glycemic control for adults with diabetes: <7.0     CBG: Recent Labs  Lab 11/15/19 1605 11/15/19 1942 11/15/19 2329 11/23/2019 0326 12/12/2019 0747  GLUCAP 135* 138* 141* 137* 143*       Critical care time: 45 minutes      01/16/20, MD [facility] Pager: [phone] Cell: [phone] If no response, call [phone]

## 2019-12-13 NOTE — Progress Notes (Signed)
[facility]  I saw and examined [name] at 1755 this afternoon.  He was on levophed and his MAP was 70 GCS 3 No corneal reflex No gag No response to pain  EEG discussed with neurology: EEG showed no activity  He is brain dead as of 1755  I informed the sons that we are going to remove him from the ventilator, they said they are going to come in to see him tonight.  He will be extubated after.  Robert Kelliher, MD [facility] Pager: [phone] Cell: [phone] If no response, call [phone]

## 2019-12-13 DEATH — deceased

## 2020-01-11 ENCOUNTER — Ambulatory Visit: Payer: Medicare HMO | Admitting: Gastroenterology

## 2020-10-12 IMAGING — MR MR HEAD W/O CM
9 of 19 series · 12 of 48 positions shown · IV contrast (gadavist)
Comparison: Comparison made with prior head CT from earlier the
same day.

CLINICAL DATA: Initial evaluation for acute stroke, right-sided
weakness.

EXAM:
MRI HEAD WITHOUT CONTRAST
MRA HEAD WITHOUT CONTRAST
MRA NECK WITHOUT AND WITH CONTRAST
TECHNIQUE: Multiplanar, multiecho pulse sequences of the brain and surrounding
structures were obtained without intravenous contrast. Angiographic
images of the Circle of Willis were obtained using MRA technique
without intravenous contrast. Angiographic images of the neck were
obtained using MRA technique without and with intravenous contrast.
Carotid stenosis measurements (when applicable) are obtained
utilizing NASCET criteria, using the distal internal carotid
diameter as the denominator.
CONTRAST:  8mL GADAVIST GADOBUTROL 1 MMOL/ML IV SOLN

[Series 3: DWI · axial · 3.0mm · 0.94mm/px · z∈[-120,+39]mm · 2 of 108 slices shown (1 of 2)]
[im 1/108]
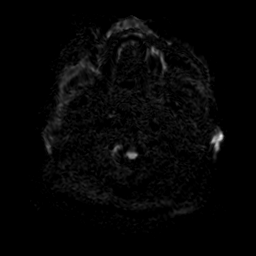
[im 108/108]
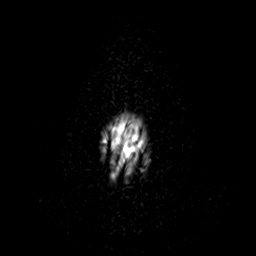

[Series 4: DWI · coronal · 4.0mm · 0.94mm/px · 2 of 76 slices shown (2 of 2)]
[im 1/76]
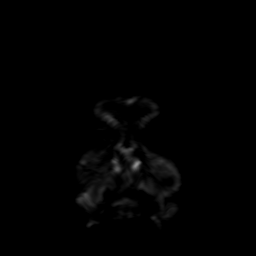
[im 76/76]
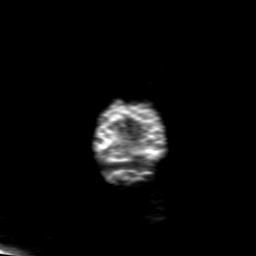

[Series 5: FLAIR · sagittal · 5.0mm · 0.23mm/px · 1 of 23 slices shown (1 of 2)]
[im 1/23]
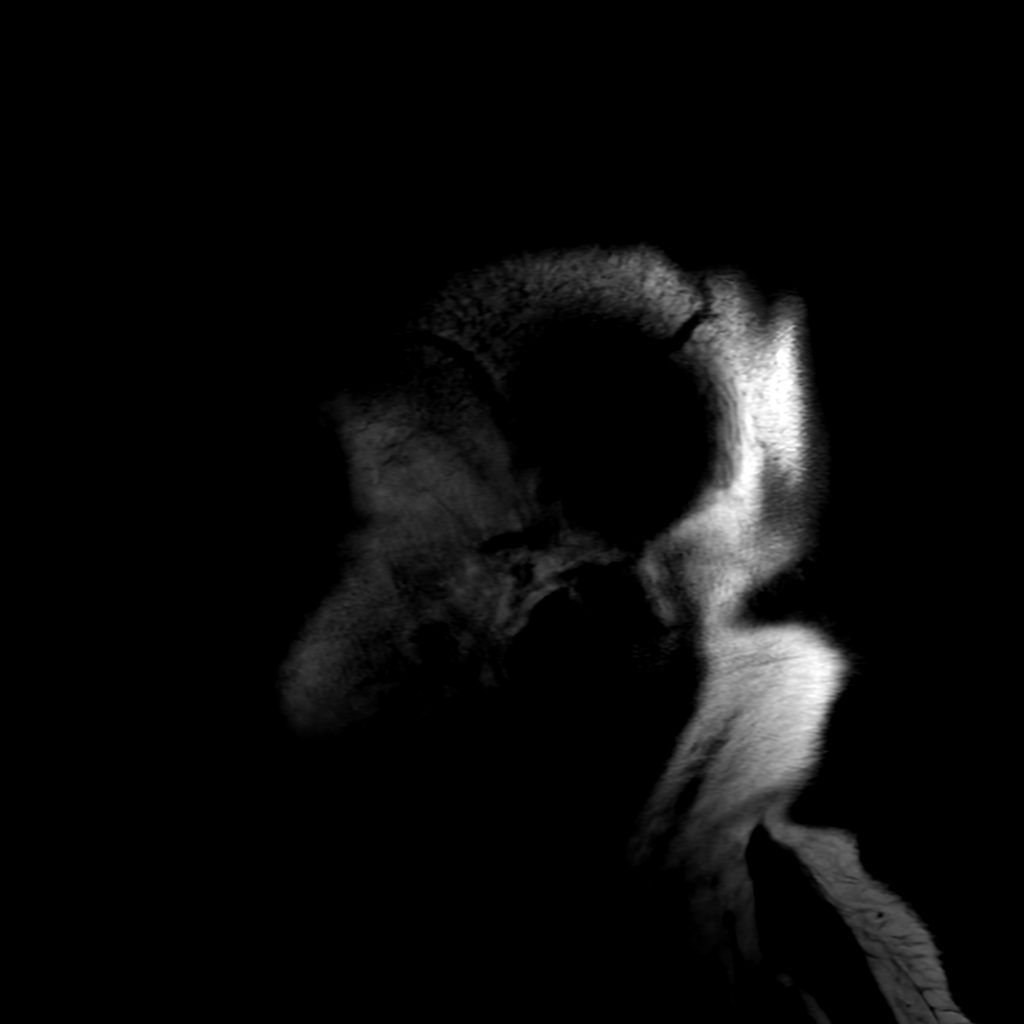

[Series 6: T2 · axial · 5.0mm · 0.23mm/px · 1 of 26 slices shown (1 of 2)]
[im 1/26]
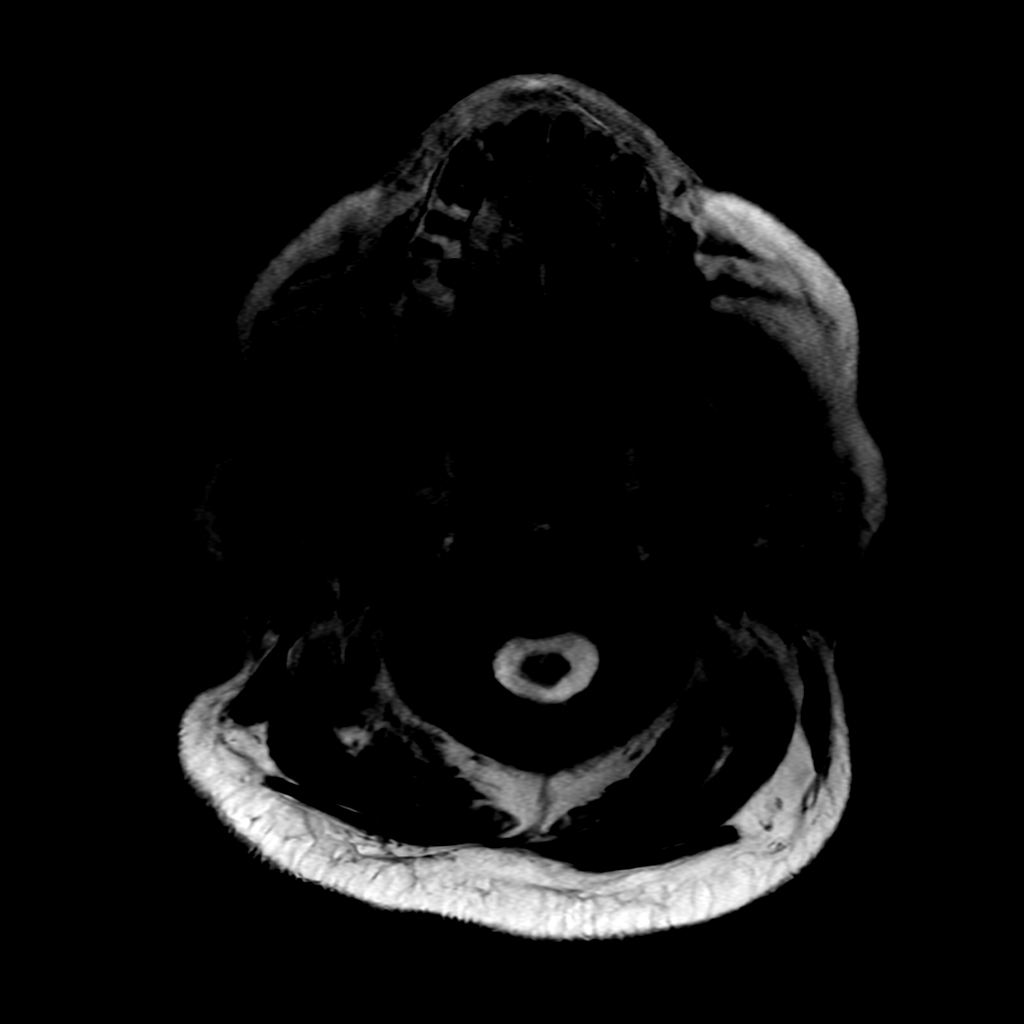

[Series 10: T1 · axial · 5.0mm · 0.47mm/px · 1 of 37 slices shown]
[im 1/37]
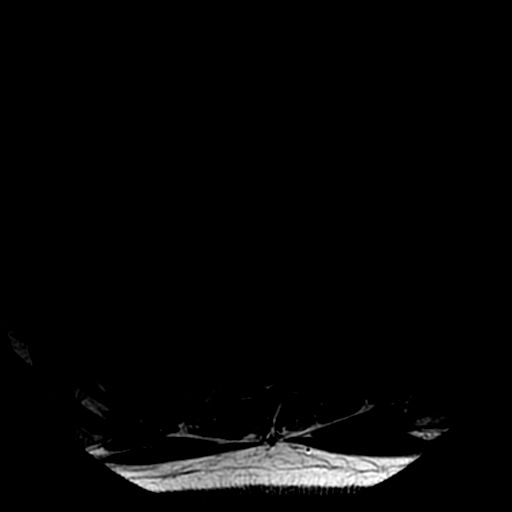

[Series 14: FLAIR · axial · 3.0mm · 0.47mm/px · 1 of 26 slices shown (2 of 2)]
[im 1/26]
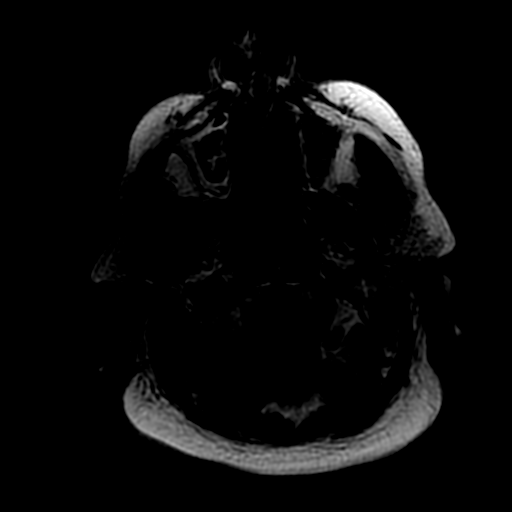

[Series 15: T2 · coronal · 5.0mm · 0.20mm/px · 1 of 27 slices shown (2 of 2)]
[im 1/27]
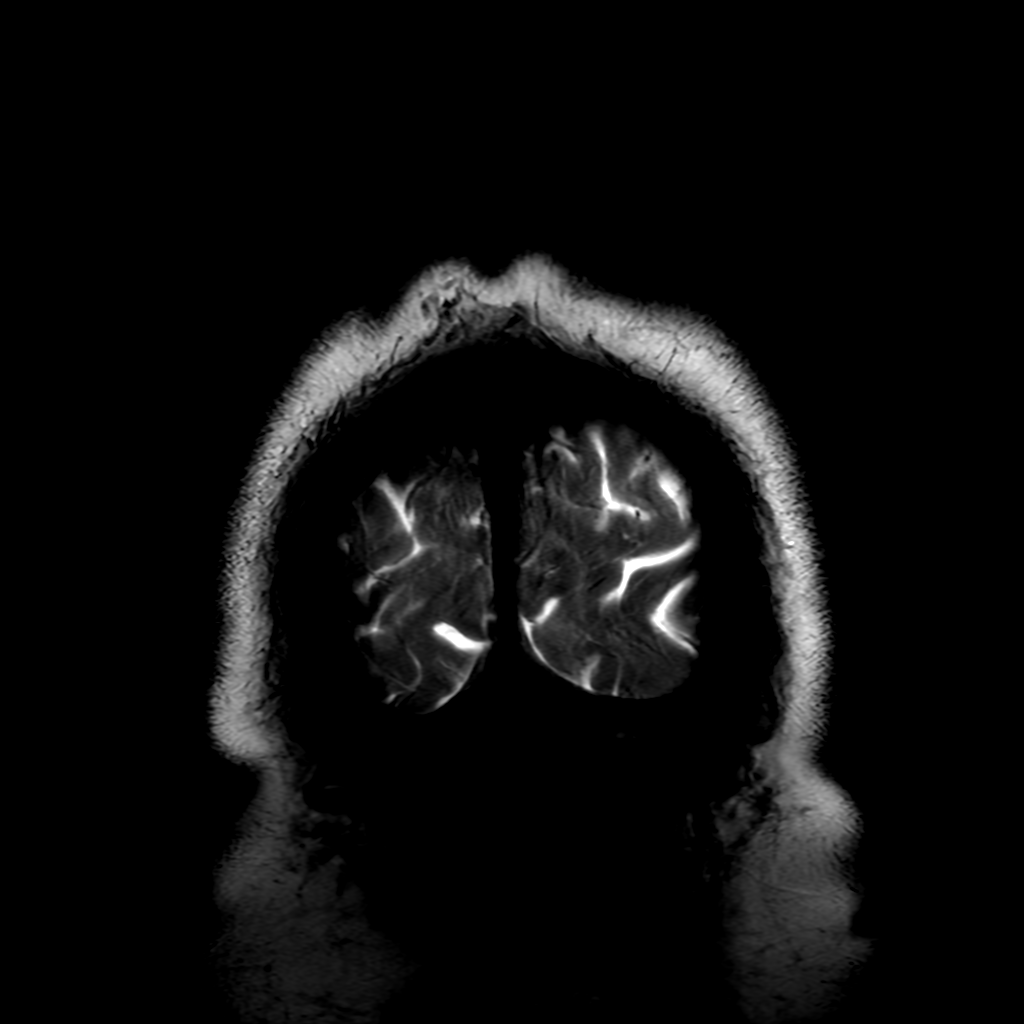

[Series 350: ADC · axial · 3.0mm · 0.94mm/px · z∈[-120,+39]mm · 2 of 54 slices shown (1 of 2)]
[im 1/54]
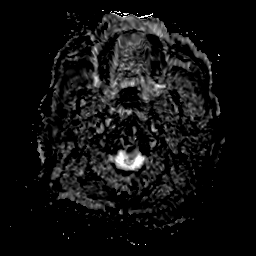
[im 54/54]
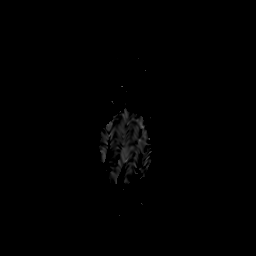

[Series 450: ADC · coronal · 4.0mm · 0.94mm/px · 1 of 38 slices shown (2 of 2)]
[im 1/38]
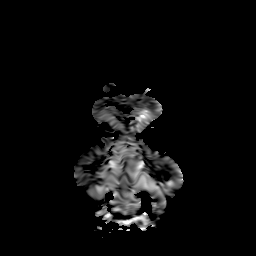

[12 of 48 positions shown; findings below may reference images not displayed]

FINDINGS: MRI HEAD FINDINGS

Brain: Examination moderately degraded by motion artifact.

Diffuse prominence of the CSF containing spaces compatible with
generalized age-related cerebral atrophy, moderately advanced for
age. Patchy and confluent T2/FLAIR hyperintensity within the
periventricular and deep white matter both cerebral hemispheres most
consistent with chronic small vessel ischemic disease, moderate in
nature. Patchy involvement of the pons noted. Multiple scattered
remote lacunar infarcts present about the bilateral basal ganglia,
thalami, and hemispheric cerebral white matter.

No abnormal foci of restricted diffusion to suggest acute or
subacute ischemia. Gray-white matter differentiation maintained. No
encephalomalacia to suggest chronic cortical infarction. No acute
intracranial hemorrhage. Few scattered punctate foci of
susceptibility artifact about the right thalamus and bilateral
cerebral hemispheres, most consistent with chronic microhemorrhages,
likely small vessel related. Note made of a 5 mm focus of
susceptibility artifact at the left thalamus, corresponding with
previously noted hyperdensity on prior CT. This appears to
correspond with calcification/mineralization on phase sequence.

No mass lesion, midline shift, or mass effect. Diffuse ventricular
prominence related to global parenchymal volume loss without
hydrocephalus. No extra-axial fluid collection. Pituitary gland
suprasellar region grossly within normal limits. Midline structures
intact.

Vascular: Major intracranial vascular flow voids are maintained.

Skull and upper cervical spine: Craniocervical junction within
normal limits. Bone marrow signal intensity grossly normal. No
visible scalp soft tissue abnormality.

Sinuses/Orbits: Globes and orbital soft tissues demonstrate no acute
finding. Moderate mucosal thickening noted throughout the ethmoidal
air cells and maxillary sinuses. Superimposed air-fluid levels
within the maxillary sinuses suggest acute sinusitis, right greater
than left. No mastoid effusion.

Other: None.

MRA HEAD FINDINGS

ANTERIOR CIRCULATION:

Visualized distal cervical segments of the internal carotid arteries
are patent with antegrade flow. Petrous segments patent bilaterally.
Short-segment mild stenosis noted at the proximal cavernous right
ICA (series 2, image 102). Cavernous and supraclinoid ICAs otherwise
widely patent without hemodynamically significant stenosis. A1
segments patent bilaterally. Normal anterior communicating artery
complex. Anterior cerebral arteries widely patent to their distal
aspects without stenosis. No M1 stenosis or occlusion. Normal MCA
bifurcations. Distal MCA branches well perfused and symmetric.

POSTERIOR CIRCULATION:

Vertebral arteries widely patent to the vertebrobasilar junction
without stenosis. Left vertebral artery dominant. Partially
visualized left PICA appears patent. Right PICA not seen. Basilar
widely patent to its distal aspect without stenosis. Superior
cerebral arteries patent bilaterally. Both PCAs primarily supplied
via the basilar well perfused to their distal aspects.

No intracranial aneurysm.

MRA NECK FINDINGS

AORTIC ARCH: Visualized aortic arch of normal caliber with normal 3
vessel morphology. No hemodynamically significant stenosis seen
about the origin of the great vessels.

RIGHT CAROTID SYSTEM: Right CCA widely patent from its origin to the
bifurcation without stenosis. Mild atheromatous irregularity about
the right bifurcation/proximal right ICA without hemodynamically
significant stenosis. Right ICA widely patent distally to the skull
base without stenosis or occlusion.

LEFT CAROTID SYSTEM: Left common carotid artery widely patent from
its origin to the bifurcation without stenosis. No significant
atheromatous narrowing or irregularity about the left bifurcation.
Left ICA widely patent distally to the skull base without stenosis
or occlusion.

VERTEBRAL ARTERIES: Both vertebral arteries arise from the
subclavian arteries. No proximal subclavian artery stenosis. Left
vertebral artery dominant. Both vertebral arteries widely patent
within the neck without stenosis or occlusion.
IMPRESSION: MRI HEAD IMPRESSION:

1. No acute intracranial abnormality.
2. Moderately advanced age-related cerebral atrophy with chronic
microvascular ischemic disease, with a few scattered remote lacunar
infarcts about the bilateral basal ganglia, thalami, and hemispheric
cerebral white matter.
3. Small focus of susceptibility artifact at the left thalamus,
corresponding with hyperdensity seen on prior CT. This is most
consistent with a small focus of mineralization/calcification. No
acute intracranial hemorrhage.
4. Acute ethmoidal and maxillary sinusitis, right worse than left.

MRA HEAD IMPRESSION:

Negative intracranial MRA. No large vessel occlusion or
hemodynamically significant stenosis. No aneurysm.

MRA NECK IMPRESSION:

Negative MRA of the neck. No hemodynamically significant or critical
flow limiting stenosis about either carotid artery system. Both
vertebral arteries widely patent within the neck.

## 2020-10-12 IMAGING — MR MR MRA NECK WO/W CM
3 of 6 series · 7 of 48 positions shown · IV contrast (gadavist)
Comparison: Comparison made with prior head CT from earlier the
same day.

CLINICAL DATA: Initial evaluation for acute stroke, right-sided
weakness.

EXAM:
MRI HEAD WITHOUT CONTRAST
MRA HEAD WITHOUT CONTRAST
MRA NECK WITHOUT AND WITH CONTRAST
TECHNIQUE: Multiplanar, multiecho pulse sequences of the brain and surrounding
structures were obtained without intravenous contrast. Angiographic
images of the Circle of Willis were obtained using MRA technique
without intravenous contrast. Angiographic images of the neck were
obtained using MRA technique without and with intravenous contrast.
Carotid stenosis measurements (when applicable) are obtained
utilizing NASCET criteria, using the distal internal carotid
diameter as the denominator.
CONTRAST:  8mL GADAVIST GADOBUTROL 1 MMOL/ML IV SOLN

[Series 4: DWI · coronal · 4.0mm · 0.94mm/px · 3 of 76 slices shown]
[im 11/76]
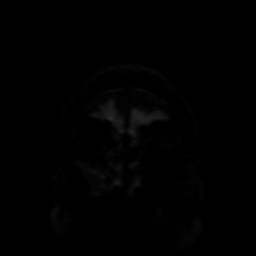
[im 43/76]
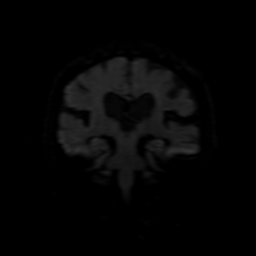
[im 65/76]
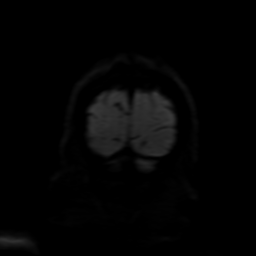

[Series 5: FLAIR · sagittal · 5.0mm · 0.23mm/px · 2 of 23 slices shown]
[im 1/23]
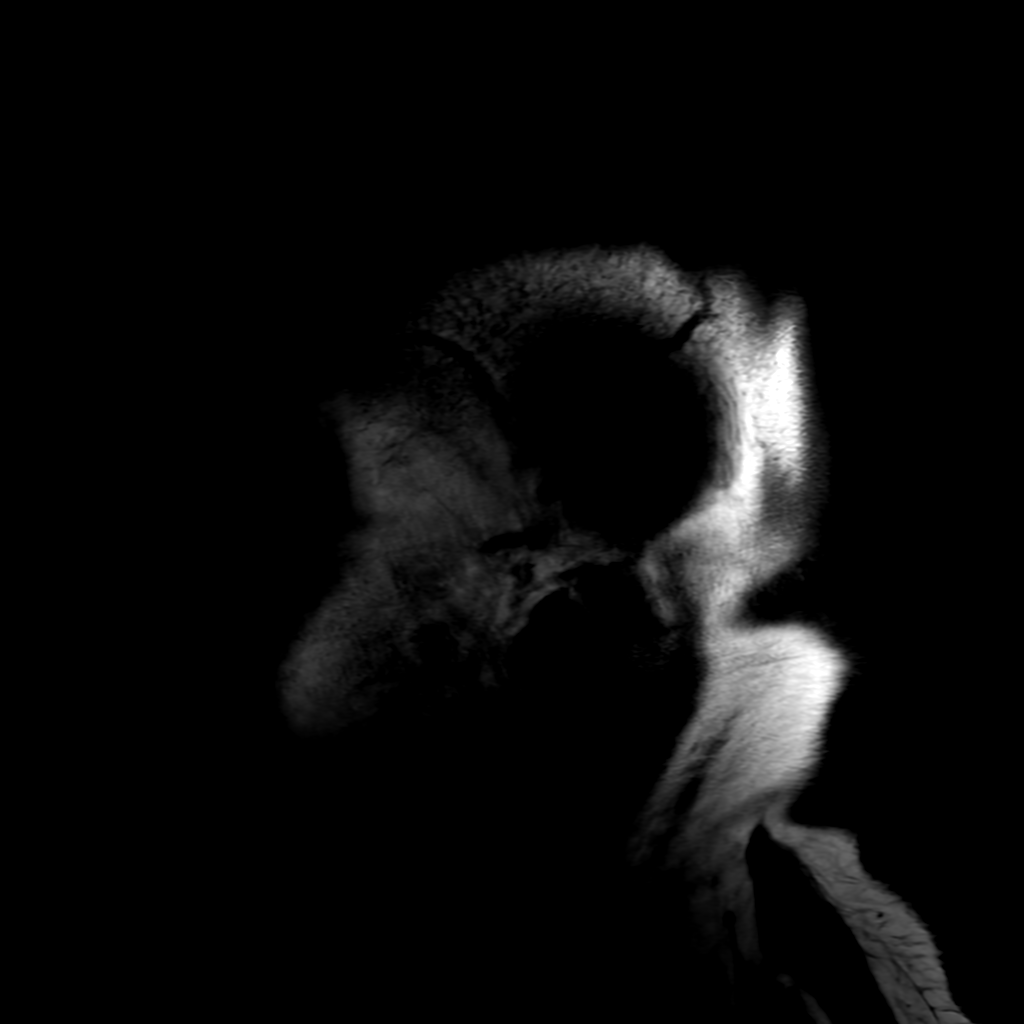
[im 23/23]
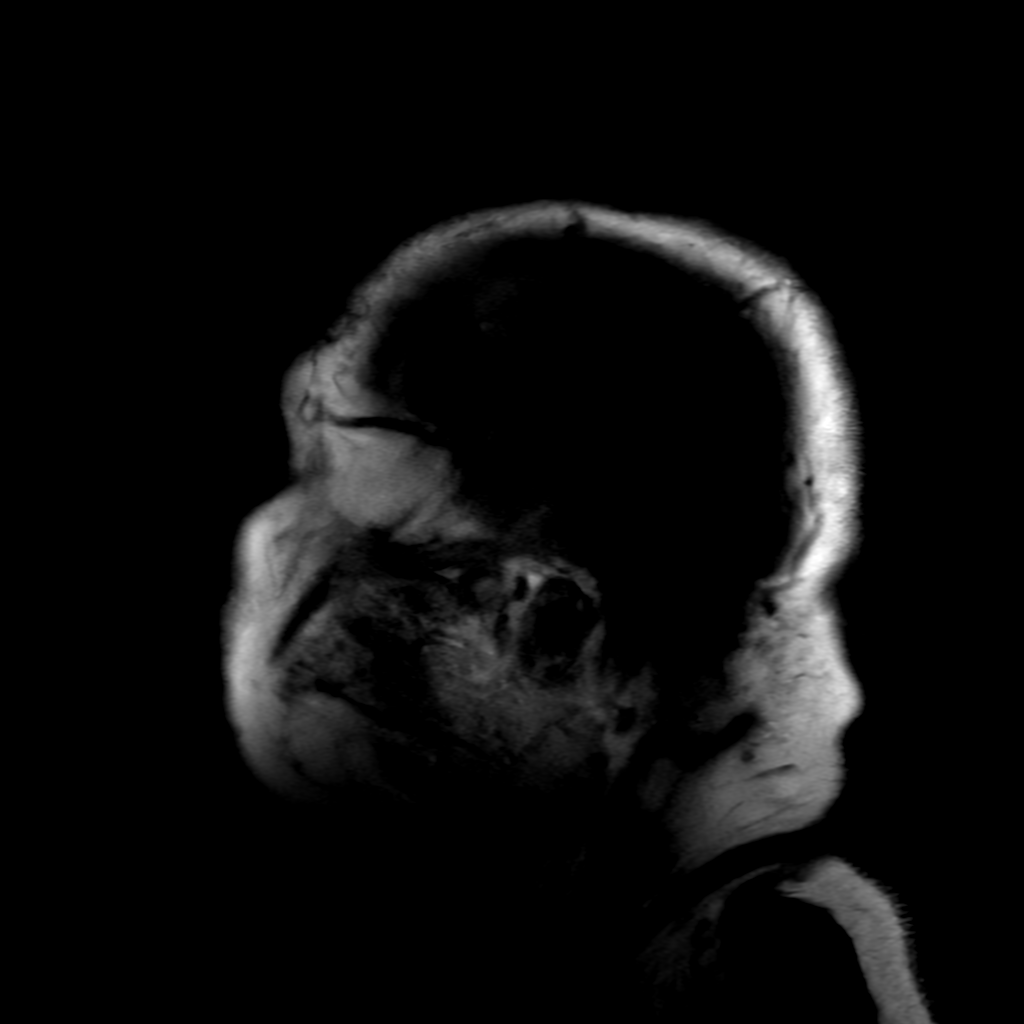

[Series 15: T2 · coronal · 5.0mm · 0.20mm/px · 2 of 27 slices shown]
[im 1/27]
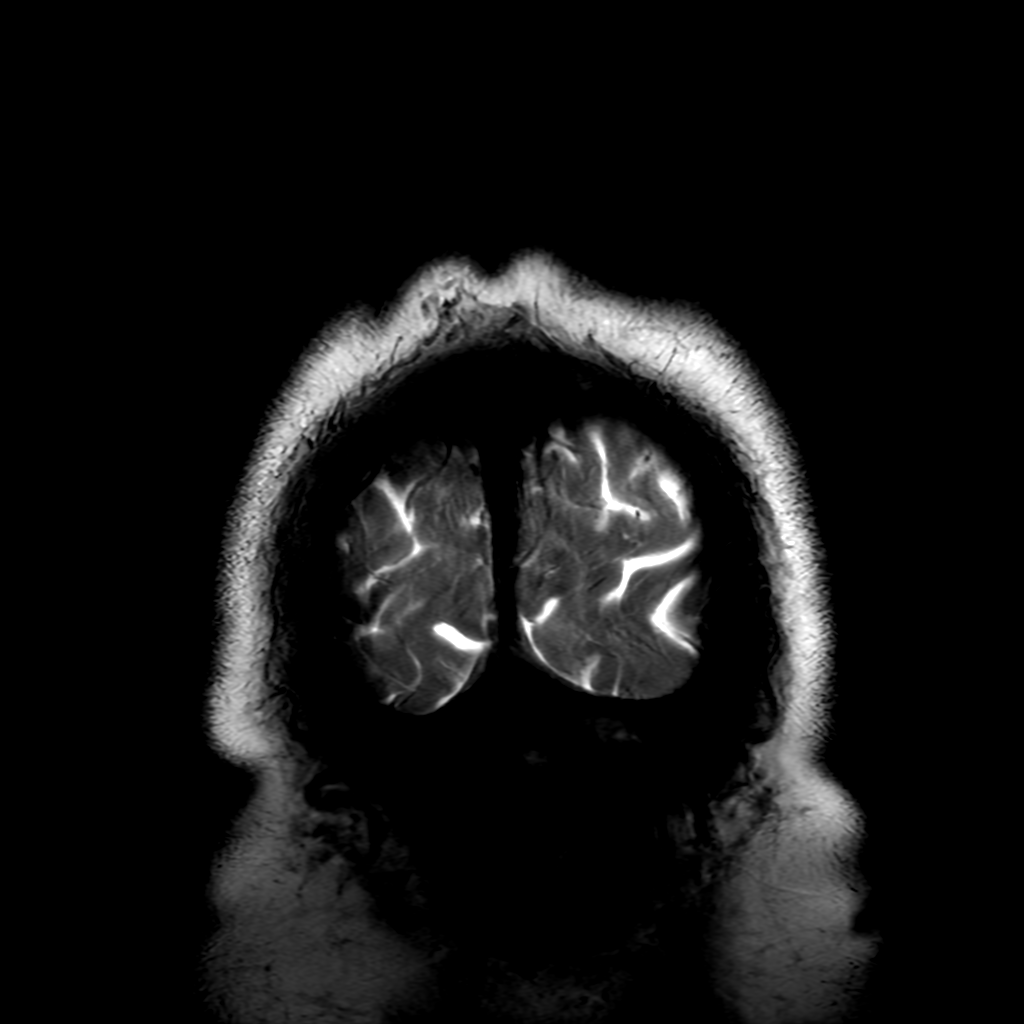
[im 14/27]
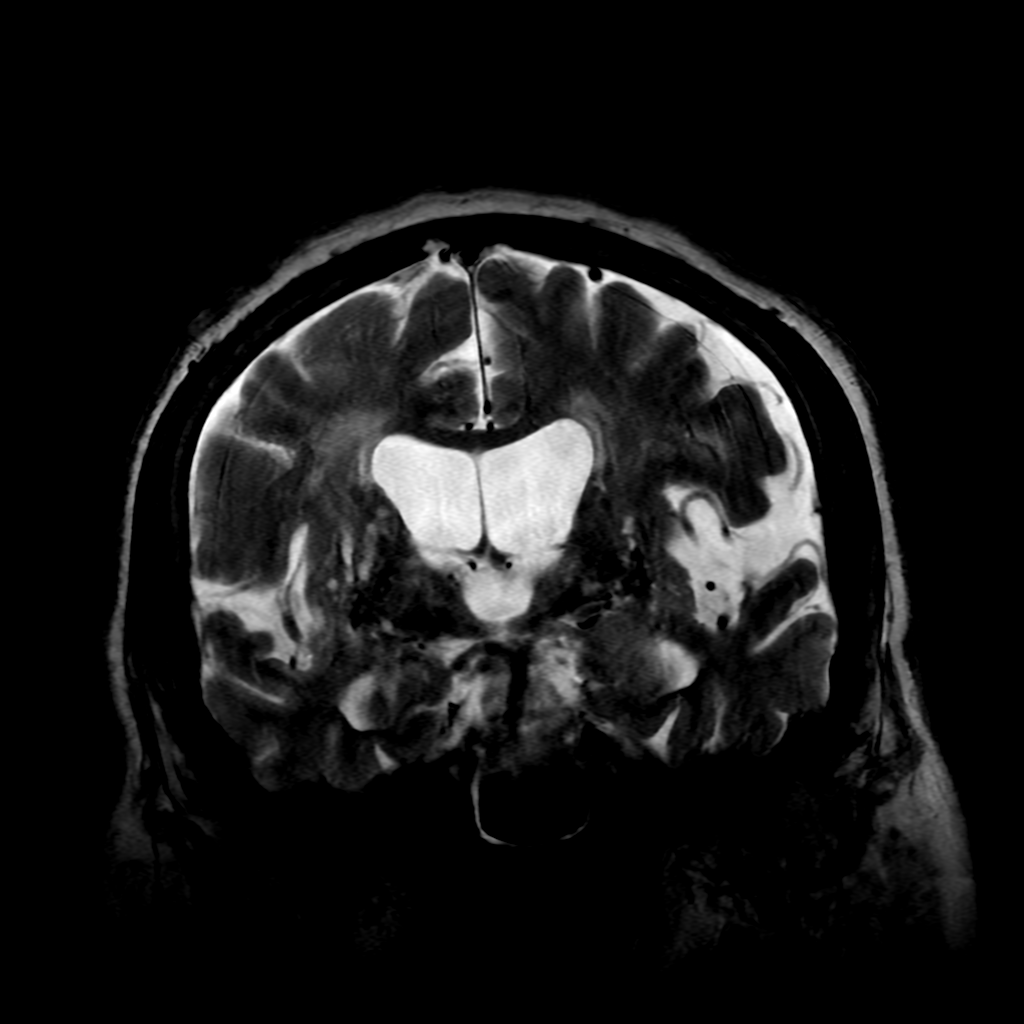

[7 of 48 positions shown; findings below may reference images not displayed]

FINDINGS: MRI HEAD FINDINGS

Brain: Examination moderately degraded by motion artifact.

Diffuse prominence of the CSF containing spaces compatible with
generalized age-related cerebral atrophy, moderately advanced for
age. Patchy and confluent T2/FLAIR hyperintensity within the
periventricular and deep white matter both cerebral hemispheres most
consistent with chronic small vessel ischemic disease, moderate in
nature. Patchy involvement of the pons noted. Multiple scattered
remote lacunar infarcts present about the bilateral basal ganglia,
thalami, and hemispheric cerebral white matter.

No abnormal foci of restricted diffusion to suggest acute or
subacute ischemia. Gray-white matter differentiation maintained. No
encephalomalacia to suggest chronic cortical infarction. No acute
intracranial hemorrhage. Few scattered punctate foci of
susceptibility artifact about the right thalamus and bilateral
cerebral hemispheres, most consistent with chronic microhemorrhages,
likely small vessel related. Note made of a 5 mm focus of
susceptibility artifact at the left thalamus, corresponding with
previously noted hyperdensity on prior CT. This appears to
correspond with calcification/mineralization on phase sequence.

No mass lesion, midline shift, or mass effect. Diffuse ventricular
prominence related to global parenchymal volume loss without
hydrocephalus. No extra-axial fluid collection. Pituitary gland
suprasellar region grossly within normal limits. Midline structures
intact.

Vascular: Major intracranial vascular flow voids are maintained.

Skull and upper cervical spine: Craniocervical junction within
normal limits. Bone marrow signal intensity grossly normal. No
visible scalp soft tissue abnormality.

Sinuses/Orbits: Globes and orbital soft tissues demonstrate no acute
finding. Moderate mucosal thickening noted throughout the ethmoidal
air cells and maxillary sinuses. Superimposed air-fluid levels
within the maxillary sinuses suggest acute sinusitis, right greater
than left. No mastoid effusion.

Other: None.

MRA HEAD FINDINGS

ANTERIOR CIRCULATION:

Visualized distal cervical segments of the internal carotid arteries
are patent with antegrade flow. Petrous segments patent bilaterally.
Short-segment mild stenosis noted at the proximal cavernous right
ICA (series 2, image 102). Cavernous and supraclinoid ICAs otherwise
widely patent without hemodynamically significant stenosis. A1
segments patent bilaterally. Normal anterior communicating artery
complex. Anterior cerebral arteries widely patent to their distal
aspects without stenosis. No M1 stenosis or occlusion. Normal MCA
bifurcations. Distal MCA branches well perfused and symmetric.

POSTERIOR CIRCULATION:

Vertebral arteries widely patent to the vertebrobasilar junction
without stenosis. Left vertebral artery dominant. Partially
visualized left PICA appears patent. Right PICA not seen. Basilar
widely patent to its distal aspect without stenosis. Superior
cerebral arteries patent bilaterally. Both PCAs primarily supplied
via the basilar well perfused to their distal aspects.

No intracranial aneurysm.

MRA NECK FINDINGS

AORTIC ARCH: Visualized aortic arch of normal caliber with normal 3
vessel morphology. No hemodynamically significant stenosis seen
about the origin of the great vessels.

RIGHT CAROTID SYSTEM: Right CCA widely patent from its origin to the
bifurcation without stenosis. Mild atheromatous irregularity about
the right bifurcation/proximal right ICA without hemodynamically
significant stenosis. Right ICA widely patent distally to the skull
base without stenosis or occlusion.

LEFT CAROTID SYSTEM: Left common carotid artery widely patent from
its origin to the bifurcation without stenosis. No significant
atheromatous narrowing or irregularity about the left bifurcation.
Left ICA widely patent distally to the skull base without stenosis
or occlusion.

VERTEBRAL ARTERIES: Both vertebral arteries arise from the
subclavian arteries. No proximal subclavian artery stenosis. Left
vertebral artery dominant. Both vertebral arteries widely patent
within the neck without stenosis or occlusion.
IMPRESSION: MRI HEAD IMPRESSION:

1. No acute intracranial abnormality.
2. Moderately advanced age-related cerebral atrophy with chronic
microvascular ischemic disease, with a few scattered remote lacunar
infarcts about the bilateral basal ganglia, thalami, and hemispheric
cerebral white matter.
3. Small focus of susceptibility artifact at the left thalamus,
corresponding with hyperdensity seen on prior CT. This is most
consistent with a small focus of mineralization/calcification. No
acute intracranial hemorrhage.
4. Acute ethmoidal and maxillary sinusitis, right worse than left.

MRA HEAD IMPRESSION:

Negative intracranial MRA. No large vessel occlusion or
hemodynamically significant stenosis. No aneurysm.

MRA NECK IMPRESSION:

Negative MRA of the neck. No hemodynamically significant or critical
flow limiting stenosis about either carotid artery system. Both
vertebral arteries widely patent within the neck.
